# Patient Record
Sex: Female | Born: 1955 | Race: White | Hispanic: No | Smoking: Current every day smoker
Health system: Southern US, Community
[De-identification: ages and names within clinical notes are randomized; demographics above are authoritative.]

## PROBLEM LIST (undated history)

## (undated) DIAGNOSIS — Z7289 Other problems related to lifestyle: Secondary | ICD-10-CM

## (undated) DIAGNOSIS — T7840XA Allergy, unspecified, initial encounter: Secondary | ICD-10-CM

## (undated) DIAGNOSIS — M359 Systemic involvement of connective tissue, unspecified: Secondary | ICD-10-CM

## (undated) DIAGNOSIS — F419 Anxiety disorder, unspecified: Secondary | ICD-10-CM

## (undated) DIAGNOSIS — E785 Hyperlipidemia, unspecified: Secondary | ICD-10-CM

## (undated) DIAGNOSIS — E119 Type 2 diabetes mellitus without complications: Secondary | ICD-10-CM

## (undated) DIAGNOSIS — F32A Depression, unspecified: Secondary | ICD-10-CM

## (undated) DIAGNOSIS — C801 Malignant (primary) neoplasm, unspecified: Secondary | ICD-10-CM

## (undated) DIAGNOSIS — J449 Chronic obstructive pulmonary disease, unspecified: Secondary | ICD-10-CM

## (undated) DIAGNOSIS — Z789 Other specified health status: Secondary | ICD-10-CM

## (undated) DIAGNOSIS — F209 Schizophrenia, unspecified: Secondary | ICD-10-CM

## (undated) DIAGNOSIS — J45909 Unspecified asthma, uncomplicated: Secondary | ICD-10-CM

## (undated) DIAGNOSIS — G8929 Other chronic pain: Secondary | ICD-10-CM

## (undated) HISTORY — DX: Anxiety disorder, unspecified: F41.9

## (undated) HISTORY — DX: Unspecified asthma, uncomplicated: J45.909

## (undated) HISTORY — DX: Allergy, unspecified, initial encounter: T78.40XA

## (undated) HISTORY — DX: Schizophrenia, unspecified: F20.9

## (undated) HISTORY — PX: ABDOMINAL HYSTERECTOMY: SHX81

## (undated) HISTORY — DX: Chronic obstructive pulmonary disease, unspecified: J44.9

## (undated) HISTORY — PX: APPENDECTOMY: SHX54

## (undated) HISTORY — DX: Type 2 diabetes mellitus without complications: E11.9

## (undated) HISTORY — DX: Hyperlipidemia, unspecified: E78.5

## (undated) HISTORY — DX: Malignant (primary) neoplasm, unspecified: C80.1

## (undated) HISTORY — DX: Depression, unspecified: F32.A

---

## 2000-02-04 ENCOUNTER — Inpatient Hospital Stay (HOSPITAL_COMMUNITY): Admission: EM | Admit: 2000-02-04 | Discharge: 2000-02-14 | Payer: Self-pay | Admitting: *Deleted

## 2003-12-07 ENCOUNTER — Inpatient Hospital Stay (HOSPITAL_COMMUNITY): Admission: EM | Admit: 2003-12-07 | Discharge: 2003-12-12 | Payer: Self-pay | Admitting: Psychiatry

## 2004-06-21 ENCOUNTER — Emergency Department (HOSPITAL_COMMUNITY): Admission: EM | Admit: 2004-06-21 | Discharge: 2004-06-21 | Payer: Self-pay | Admitting: Emergency Medicine

## 2011-10-28 DIAGNOSIS — R0602 Shortness of breath: Secondary | ICD-10-CM

## 2014-08-31 ENCOUNTER — Other Ambulatory Visit: Payer: Self-pay | Admitting: Family Medicine

## 2014-08-31 DIAGNOSIS — R921 Mammographic calcification found on diagnostic imaging of breast: Secondary | ICD-10-CM

## 2014-09-09 ENCOUNTER — Ambulatory Visit
Admission: RE | Admit: 2014-09-09 | Discharge: 2014-09-09 | Disposition: A | Discharge status: Home / Self Care | Payer: Medicare Other | Source: Ambulatory Visit | Attending: Family Medicine | Admitting: Family Medicine

## 2014-09-09 ENCOUNTER — Other Ambulatory Visit (HOSPITAL_COMMUNITY): Payer: Self-pay | Admitting: Diagnostic Radiology

## 2014-09-09 DIAGNOSIS — R921 Mammographic calcification found on diagnostic imaging of breast: Secondary | ICD-10-CM

## 2014-10-06 ENCOUNTER — Ambulatory Visit (INDEPENDENT_AMBULATORY_CARE_PROVIDER_SITE_OTHER): Payer: Medicare Other | Admitting: Orthopedic Surgery

## 2014-10-06 ENCOUNTER — Ambulatory Visit (INDEPENDENT_AMBULATORY_CARE_PROVIDER_SITE_OTHER): Payer: Medicare Other

## 2014-10-06 VITALS — BP 121/76 | Ht 65.5 in | Wt 218.0 lb

## 2014-10-06 DIAGNOSIS — M1711 Unilateral primary osteoarthritis, right knee: Secondary | ICD-10-CM | POA: Diagnosis not present

## 2014-10-06 DIAGNOSIS — M25561 Pain in right knee: Secondary | ICD-10-CM

## 2014-10-06 MED ORDER — DICLOFENAC POTASSIUM 50 MG PO TABS: 50.0000 mg | ORAL_TABLET | Freq: Two times a day (BID) | ORAL | Status: DC

## 2014-10-06 NOTE — Patient Instructions (Signed)
CALL TO ARRANGE THERAPY CHECK WITH YOUR PHARMACY, ONE SCRIPT SENT IN

## 2014-10-08 ENCOUNTER — Encounter: Payer: Self-pay | Admitting: Orthopedic Surgery

## 2014-10-08 DIAGNOSIS — M1711 Unilateral primary osteoarthritis, right knee: Secondary | ICD-10-CM | POA: Insufficient documentation

## 2014-10-08 NOTE — Progress Notes (Signed)
Patient ID: Jasmine Werner, female   DOB: 1956-04-12, 58 y.o.   MRN: [id number] Chief Complaint  Patient presents with  . Knee Pain    Right knee pain, referred by Dr. Scotty Court.   Subjective:    Jasmine Werner is a 58 y.o. female who presents with knee pain involving both knees. Onset was gradual, starting about 3 years ago. Inciting event: none known. Current symptoms include: giving out, pain located medial and lateral hemijoints, stiffness and swelling. Pain is aggravated by going up and down stairs and lying down . Patient has had no prior knee problems. Evaluation to date: none. Treatment to date: tylenol, pain cream .  Past Medical History  Diagnosis Date  . COPD (chronic obstructive pulmonary disease)   . Allergy     Past Surgical History  Procedure Laterality Date  . Abdominal hysterectomy    . Appendectomy      Family History  Problem Relation Age of Onset  . Asthma    . COPD    . Alcoholism    . Heart attack    . Depression    . Mental illness      Social History History  Substance Use Topics  . Smoking status: Never Smoker   . Smokeless tobacco: Not on file  . Alcohol Use: No    No Known Allergies  Current Outpatient Prescriptions  Medication Sig Dispense Refill  . buPROPion (ZYBAN) 150 MG 12 hr tablet Take 150 mg by mouth 2 (two) times daily.    . clonazePAM (KLONOPIN) 1 MG tablet Take 1 mg by mouth 2 (two) times daily.    . Multiple Vitamin (M.V.I. ADULT IV) Inject into the vein.    . polyethylene glycol (MIRALAX / GLYCOLAX) packet Take 17 g by mouth daily.    Marland Kitchen tiZANidine (ZANAFLEX) 4 MG capsule Take 4 mg by mouth 3 (three) times daily.    . diclofenac (CATAFLAM) 50 MG tablet Take 1 tablet (50 mg total) by mouth 2 (two) times daily. 60 tablet 3   No current facility-administered medications for this visit.      Review of Systems A comprehensive review of systems was negative except for: shortness of breath depression anxiety cervical spine disease and  neck pain and degenerative disc disease neck area.   Objective:    BP 121/76 mmHg  Ht 5' 5.5" (1.664 m)  Wt 98.884 kg (218 lb)  BMI 35.71 kg/m2 The patient's appearance is normal. She is oriented 3. Her mood is pleasant. Her upper extremities show no malalignment issues contractures subluxation atrophy or tremor   Right knee: positive exam findings: crepitus, medial joint line tenderness, lateral joint line tenderness and ROM limited to approximately 125 degrees and negative exam findings: ACL stable, PCL stable, MCL stable, LCL stable, no patellar laxity and McMurray's negative  Left knee:  normal and no effusion, full active range of motion, no joint line tenderness, ligamentous structures intact.  We note mild tenderness in the lower back and right gluteal area  normal skin was noted over both lower extremities. The patient's pulses were intact with good capillary refill and no peripheral edema. Normal sensation was noted in the lower extremities with 2+ reflexes and no pathologic reflexes. Overall balance was normal.    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 Physical therapy to address strengthening issues follow-up as needed  Right knee joint injection  Procedure note right knee injection verbal consent was obtained to inject right knee joint  Timeout was completed to confirm the site of injection  The medications used were 40 mg of Depo-Medrol and 1% lidocaine 3 cc  Anesthesia was provided by ethyl chloride and the skin was prepped with alcohol.  After cleaning the skin with alcohol a 20-gauge needle was used to inject the right knee joint. There were no complications. A sterile bandage was applied.

## 2015-08-12 ENCOUNTER — Other Ambulatory Visit: Payer: Self-pay

## 2015-08-12 ENCOUNTER — Emergency Department (HOSPITAL_COMMUNITY): Payer: Medicare Other

## 2015-08-12 ENCOUNTER — Encounter (HOSPITAL_COMMUNITY): Payer: Self-pay | Admitting: Emergency Medicine

## 2015-08-12 ENCOUNTER — Emergency Department (HOSPITAL_COMMUNITY)
Admission: EM | Admit: 2015-08-12 | Discharge: 2015-08-12 | Disposition: A | Discharge status: Home / Self Care | Payer: Medicare Other | Attending: Emergency Medicine | Admitting: Emergency Medicine

## 2015-08-12 DIAGNOSIS — J069 Acute upper respiratory infection, unspecified: Secondary | ICD-10-CM | POA: Insufficient documentation

## 2015-08-12 DIAGNOSIS — R51 Headache: Secondary | ICD-10-CM | POA: Diagnosis not present

## 2015-08-12 DIAGNOSIS — J441 Chronic obstructive pulmonary disease with (acute) exacerbation: Secondary | ICD-10-CM | POA: Diagnosis not present

## 2015-08-12 DIAGNOSIS — M791 Myalgia: Secondary | ICD-10-CM | POA: Insufficient documentation

## 2015-08-12 DIAGNOSIS — Z79899 Other long term (current) drug therapy: Secondary | ICD-10-CM | POA: Diagnosis not present

## 2015-08-12 DIAGNOSIS — Z791 Long term (current) use of non-steroidal anti-inflammatories (NSAID): Secondary | ICD-10-CM | POA: Diagnosis not present

## 2015-08-12 DIAGNOSIS — R110 Nausea: Secondary | ICD-10-CM | POA: Insufficient documentation

## 2015-08-12 DIAGNOSIS — R0602 Shortness of breath: Secondary | ICD-10-CM | POA: Diagnosis present

## 2015-08-12 DIAGNOSIS — Z7982 Long term (current) use of aspirin: Secondary | ICD-10-CM | POA: Insufficient documentation

## 2015-08-12 LAB — CBC WITH DIFFERENTIAL/PLATELET
Basophils Absolute: 0 10*3/uL (ref 0.0–0.1)
Basophils Relative: 0 % (ref 0–1)
Eosinophils Absolute: 0.2 10*3/uL (ref 0.0–0.7)
Eosinophils Relative: 2 % (ref 0–5)
HCT: 41.9 % (ref 36.0–46.0)
Hemoglobin: 13.4 g/dL (ref 12.0–15.0)
Lymphocytes Relative: 26 % (ref 12–46)
Lymphs Abs: 2.5 10*3/uL (ref 0.7–4.0)
MCH: 29.3 pg (ref 26.0–34.0)
MCHC: 32 g/dL (ref 30.0–36.0)
MCV: 91.5 fL (ref 78.0–100.0)
Monocytes Absolute: 0.6 10*3/uL (ref 0.1–1.0)
Monocytes Relative: 7 % (ref 3–12)
Neutro Abs: 6.1 10*3/uL (ref 1.7–7.7)
Neutrophils Relative %: 65 % (ref 43–77)
Platelets: 240 10*3/uL (ref 150–400)
RBC: 4.58 MIL/uL (ref 3.87–5.11)
RDW: 13.4 % (ref 11.5–15.5)
WBC: 9.5 10*3/uL (ref 4.0–10.5)

## 2015-08-12 LAB — BASIC METABOLIC PANEL
Anion gap: 7 (ref 5–15)
BUN: 19 mg/dL (ref 6–20)
CO2: 27 mmol/L (ref 22–32)
Calcium: 8.7 mg/dL — ABNORMAL LOW (ref 8.9–10.3)
Chloride: 109 mmol/L (ref 101–111)
Creatinine, Ser: 0.74 mg/dL (ref 0.44–1.00)
GFR calc Af Amer: >60 mL/min (ref >60)
GFR calc non Af Amer: >60 mL/min (ref >60)
Glucose, Bld: 119 mg/dL — ABNORMAL HIGH (ref 65–99)
Potassium: 3.8 mmol/L (ref 3.5–5.1)
Sodium: 143 mmol/L (ref 135–145)

## 2015-08-12 MED ORDER — IPRATROPIUM-ALBUTEROL 0.5-2.5 (3) MG/3ML IN SOLN
3.0000 mL | Freq: Once | RESPIRATORY_TRACT | Status: AC
  Administered 2015-08-12: 3 mL via RESPIRATORY_TRACT
  Filled 2015-08-12: qty 3

## 2015-08-12 MED ORDER — METHYLPREDNISOLONE SODIUM SUCC 125 MG IJ SOLR
125.0000 mg | Freq: Once | INTRAMUSCULAR | Status: AC
  Administered 2015-08-12: 125 mg via INTRAVENOUS
  Filled 2015-08-12: qty 2

## 2015-08-12 MED ORDER — PREDNISONE 10 MG PO TABS: 40.0000 mg | ORAL_TABLET | Freq: Every day | ORAL | Status: DC

## 2015-08-12 MED ORDER — DM-GUAIFENESIN ER 30-600 MG PO TB12: 1.0000 | ORAL_TABLET | Freq: Two times a day (BID) | ORAL | Status: DC

## 2015-08-12 NOTE — Discharge Instructions (Signed)
Usual albuterol inhaler 2 puffs every 6 hours for the next 7 days. Take prednisone as directed for the next 5 days. Take the Mucinex DM for cough and phlegm. Make an appointment to follow-up with your regular doctor. Return for any new or worse symptoms. Return for any difficulty with breathing.

## 2015-08-12 NOTE — ED Notes (Signed)
Pt c/o sob today. No relief from home nebulizer x 3. Pt states she "blacked out" while sitting in chair x 1 hour ago. denies falling. Pt also c/o chest tightness.

## 2015-08-12 NOTE — ED Provider Notes (Addendum)
CSN: [id number]     Arrival date & time 08/12/15  1543 History   First MD Initiated Contact with Patient 08/12/15 1548     Chief Complaint  Patient presents with  . Shortness of Breath     (Consider location/radiation/quality/duration/timing/severity/associated sxs/prior Treatment) The history is provided by the patient.   59 year old female with known history of COPD and asthma. Patient with shortness of breath today of feeling like she can't get air out. Using her albuterol at home without any success. Yesterday started to feel like she was coming down with an upper respiratory infection had a tingly throat some congestion mild cough started. Today that got worse and associated with some bodyaches and a headache. Patient felt like she had a fever at home but no fevers here. Patient still feels as if she can't move air well. Associated with some nausea but no vomiting or diarrhea.  Past Medical History  Diagnosis Date  . COPD (chronic obstructive pulmonary disease)   . Allergy    Past Surgical History  Procedure Laterality Date  . Abdominal hysterectomy    . Appendectomy     Family History  Problem Relation Age of Onset  . Asthma    . COPD    . Alcoholism    . Heart attack    . Depression    . Mental illness     Social History  Substance Use Topics  . Smoking status: Never Smoker   . Smokeless tobacco: None  . Alcohol Use: No   OB History    No data available     Review of Systems  Constitutional: Positive for fatigue. Negative for fever.  HENT: Positive for congestion and sore throat.   Eyes: Negative for visual disturbance.  Respiratory: Positive for cough, shortness of breath and wheezing.   Cardiovascular: Negative for chest pain.  Gastrointestinal: Positive for nausea. Negative for vomiting, abdominal pain and diarrhea.  Genitourinary: Negative for dysuria.  Musculoskeletal: Positive for myalgias.  Skin: Negative for rash.  Neurological: Positive for  headaches.  Hematological: Does not bruise/bleed easily.  Psychiatric/Behavioral: Negative for confusion.      Allergies  Review of patient's allergies indicates no known allergies.  Home Medications   Prior to Admission medications   Medication Sig Start Date End Date Taking? Authorizing Provider  aspirin EC 81 MG tablet Take 81 mg by mouth daily.   Yes Historical Provider, MD  clonazePAM (KLONOPIN) 1 MG tablet Take 1 mg by mouth daily.    Yes Historical Provider, MD  ipratropium-albuterol (DUONEB) 0.5-2.5 (3) MG/3ML SOLN Inhale 3 mLs into the lungs every 6 (six) hours as needed (Shortness of Breath).  07/21/15  Yes Historical Provider, MD  Multiple Vitamin (MULTIVITAMIN WITH MINERALS) TABS tablet Take 1 tablet by mouth daily.   Yes Historical Provider, MD  VENTOLIN HFA 108 (90 BASE) MCG/ACT inhaler Inhale 1 puff into the lungs every 6 (six) hours as needed for shortness of breath.  07/22/15  Yes Historical Provider, MD  dextromethorphan-guaiFENesin (MUCINEX DM) 30-600 MG per 12 hr tablet Take 1 tablet by mouth 2 (two) times daily. 08/12/15   Fredia Sorrow, MD  diclofenac (CATAFLAM) 50 MG tablet Take 1 tablet (50 mg total) by mouth 2 (two) times daily. 10/06/14   Carole Civil, MD  predniSONE (DELTASONE) 10 MG tablet Take 4 tablets (40 mg total) by mouth daily. 08/12/15   Fredia Sorrow, MD   BP 103/74 mmHg  Pulse 107  Temp(Src) 98.3 F (36.8 C)  Resp  29  Ht 5\' 5"  (1.651 m)  Wt 220 lb (99.791 kg)  BMI 36.61 kg/m2  SpO2 93% Physical Exam  Constitutional: She is oriented to person, place, and time. She appears well-developed and well-nourished. No distress.  HENT:  Head: Normocephalic and atraumatic.  Mouth/Throat: Oropharynx is clear and moist.  Eyes: Conjunctivae and EOM are normal. Pupils are equal, round, and reactive to light.  Neck: Normal range of motion. Neck supple.  Cardiovascular: Normal rate, regular rhythm and normal heart sounds.   No murmur  heard. Pulmonary/Chest: Effort normal. No respiratory distress. She has wheezes.  Abdominal: Soft. Bowel sounds are normal. She exhibits no distension.  Musculoskeletal: Normal range of motion.  Neurological: She is alert and oriented to person, place, and time. No cranial nerve deficit. She exhibits normal muscle tone. Coordination normal.  Skin: Skin is warm. No rash noted.  Nursing note and vitals reviewed.   ED Course  Procedures (including critical care time) Labs Review Labs Reviewed  BASIC METABOLIC PANEL - Abnormal; Notable for the following:    Glucose, Bld 119 (*)    Calcium 8.7 (*)    All other components within normal limits  CBC WITH DIFFERENTIAL/PLATELET   Results for orders placed or performed during the hospital encounter of 11/91/47  Basic metabolic panel  Result Value Ref Range   Sodium 143 135 - 145 mmol/L   Potassium 3.8 3.5 - 5.1 mmol/L   Chloride 109 101 - 111 mmol/L   CO2 27 22 - 32 mmol/L   Glucose, Bld 119 (H) 65 - 99 mg/dL   BUN 19 6 - 20 mg/dL   Creatinine, Ser 0.74 0.44 - 1.00 mg/dL   Calcium 8.7 (L) 8.9 - 10.3 mg/dL   GFR calc non Af Amer >60 >60 mL/min   GFR calc Af Amer >60 >60 mL/min   Anion gap 7 5 - 15  CBC with Differential/Platelet  Result Value Ref Range   WBC 9.5 4.0 - 10.5 K/uL   RBC 4.58 3.87 - 5.11 MIL/uL   Hemoglobin 13.4 12.0 - 15.0 g/dL   HCT 41.9 36.0 - 46.0 %   MCV 91.5 78.0 - 100.0 fL   MCH 29.3 26.0 - 34.0 pg   MCHC 32.0 30.0 - 36.0 g/dL   RDW 13.4 11.5 - 15.5 %   Platelets 240 150 - 400 K/uL   Neutrophils Relative % 65 43 - 77 %   Neutro Abs 6.1 1.7 - 7.7 K/uL   Lymphocytes Relative 26 12 - 46 %   Lymphs Abs 2.5 0.7 - 4.0 K/uL   Monocytes Relative 7 3 - 12 %   Monocytes Absolute 0.6 0.1 - 1.0 K/uL   Eosinophils Relative 2 0 - 5 %   Eosinophils Absolute 0.2 0.0 - 0.7 K/uL   Basophils Relative 0 0 - 1 %   Basophils Absolute 0.0 0.0 - 0.1 K/uL     Imaging Review Dg Chest 2 View  08/12/2015   CLINICAL DATA:  Acute  shortness of breath.  EXAM: CHEST  2 VIEW  COMPARISON:  June 21, 2004.  FINDINGS: The heart size and mediastinal contours are within normal limits. Both lungs are clear. No pneumothorax or pleural effusion is noted. He visualized skeletal structures are unremarkable.  IMPRESSION: No active cardiopulmonary disease.   Electronically Signed   By: Marijo Conception, M.D.   On: 08/12/2015 16:37   I have personally reviewed and evaluated these images and lab results as part of my medical decision-making.  EKG Interpretation None      ED ECG REPORT   Date: 08/12/2015  Rate: 111  Rhythm: normal sinus rhythm  QRS Axis: normal  Intervals: normal  ST/T Wave abnormalities: normal  Conduction Disutrbances:none  Narrative Interpretation:   Old EKG Reviewed: none available Technically sinus tachycardia I have personally reviewed the EKG tracing and agree with the computerized printout as noted.     MDM   Final diagnoses:  URI (upper respiratory infection)  COPD exacerbation   Patient with a history of COPD. Patient also yesterday started with a symptoms consistent with an upper respiratory infection. Some congestion mild sore throat. And mild cough. Today breathing got worse. Patient feels like a lot of drainage from the sinus area down the back of throat. And associated with fatigue and some body aches.  Upon presentation patient was wheezing bilaterally. Patient treated with a computer overall Atrovent nebulizer and wheezing resolved. Patient then was given Solu-Medrol. Patient also had the complaint of a mild headache not severe that was treated with Benadryl and Phenergan with significant improvement.  Patient will continue prednisone for the next 5 days patient will be started on Mucinex DM. Patient will also continue her albuterol inhaler at home for the next 7 days.  Chest x-ray was negative for pneumonia.   Fredia Sorrow, MD 08/12/15 1711  Fredia Sorrow, MD 08/12/15  1910  Addendum: Correction of patient's mild headache was not treated with Benadryl and Phenergan. Improved on its own.  Fredia Sorrow, MD 08/12/15 1931

## 2015-10-02 ENCOUNTER — Other Ambulatory Visit: Payer: Self-pay | Admitting: Orthopedic Surgery

## 2015-11-01 ENCOUNTER — Other Ambulatory Visit (HOSPITAL_COMMUNITY): Payer: Self-pay | Admitting: Respiratory Therapy

## 2015-11-01 DIAGNOSIS — J441 Chronic obstructive pulmonary disease with (acute) exacerbation: Secondary | ICD-10-CM

## 2015-11-08 ENCOUNTER — Ambulatory Visit (HOSPITAL_COMMUNITY)
Admission: RE | Admit: 2015-11-08 | Discharge: 2015-11-08 | Disposition: A | Discharge status: Home / Self Care | Payer: Medicare Other | Source: Ambulatory Visit | Attending: Pulmonary Disease | Admitting: Pulmonary Disease

## 2015-11-08 DIAGNOSIS — J441 Chronic obstructive pulmonary disease with (acute) exacerbation: Secondary | ICD-10-CM | POA: Insufficient documentation

## 2015-11-08 MED ORDER — ALBUTEROL SULFATE (2.5 MG/3ML) 0.083% IN NEBU
2.5000 mg | INHALATION_SOLUTION | Freq: Once | RESPIRATORY_TRACT | Status: AC
  Administered 2015-11-08: 2.5 mg via RESPIRATORY_TRACT

## 2015-11-13 LAB — PULMONARY FUNCTION TEST
DL/VA % pred: 114 %
DL/VA: 5.62 ml/min/mmHg/L
DLCO UNC % PRED: 65 %
DLCO UNC: 16.7 ml/min/mmHg
FEF 25-75 PRE: 0.74 L/s
FEF 25-75 Post: 1.15 L/sec
FEF2575-%Change-Post: 54 %
FEF2575-%PRED-POST: 45 %
FEF2575-%Pred-Pre: 29 %
FEV1-%CHANGE-POST: 16 %
FEV1-%PRED-POST: 54 %
FEV1-%PRED-PRE: 46 %
FEV1-POST: 1.48 L
FEV1-Pre: 1.27 L
FEV1FVC-%Change-Post: -1 %
FEV1FVC-%Pred-Pre: 82 %
FEV6-%CHANGE-POST: 17 %
FEV6-%PRED-POST: 67 %
FEV6-%Pred-Pre: 57 %
FEV6-POST: 2.29 L
FEV6-PRE: 1.95 L
FEV6FVC-%CHANGE-POST: 0 %
FEV6FVC-%PRED-POST: 102 %
FEV6FVC-%PRED-PRE: 103 %
FVC-%Change-Post: 17 %
FVC-%PRED-POST: 65 %
FVC-%Pred-Pre: 55 %
FVC-Post: 2.29 L
FVC-Pre: 1.95 L
POST FEV1/FVC RATIO: 65 %
POST FEV6/FVC RATIO: 100 %
PRE FEV6/FVC RATIO: 100 %
Pre FEV1/FVC ratio: 65 %
RV % PRED: 164 %
RV: 3.26 L
TLC % PRED: 96 %
TLC: 5.04 L

## 2015-12-19 DIAGNOSIS — F419 Anxiety disorder, unspecified: Secondary | ICD-10-CM | POA: Diagnosis not present

## 2015-12-19 DIAGNOSIS — F329 Major depressive disorder, single episode, unspecified: Secondary | ICD-10-CM | POA: Diagnosis not present

## 2015-12-19 DIAGNOSIS — J449 Chronic obstructive pulmonary disease, unspecified: Secondary | ICD-10-CM | POA: Diagnosis not present

## 2015-12-19 DIAGNOSIS — K210 Gastro-esophageal reflux disease with esophagitis: Secondary | ICD-10-CM | POA: Diagnosis not present

## 2016-02-14 DIAGNOSIS — K625 Hemorrhage of anus and rectum: Secondary | ICD-10-CM | POA: Diagnosis not present

## 2016-02-14 DIAGNOSIS — R1031 Right lower quadrant pain: Secondary | ICD-10-CM | POA: Diagnosis not present

## 2016-03-11 DIAGNOSIS — R509 Fever, unspecified: Secondary | ICD-10-CM | POA: Diagnosis not present

## 2016-07-24 DIAGNOSIS — J40 Bronchitis, not specified as acute or chronic: Secondary | ICD-10-CM | POA: Diagnosis not present

## 2016-07-24 DIAGNOSIS — R0602 Shortness of breath: Secondary | ICD-10-CM | POA: Diagnosis not present

## 2016-07-24 DIAGNOSIS — R062 Wheezing: Secondary | ICD-10-CM | POA: Diagnosis not present

## 2016-07-24 DIAGNOSIS — R05 Cough: Secondary | ICD-10-CM | POA: Diagnosis not present

## 2016-07-24 DIAGNOSIS — J441 Chronic obstructive pulmonary disease with (acute) exacerbation: Secondary | ICD-10-CM | POA: Diagnosis not present

## 2016-07-30 DIAGNOSIS — J441 Chronic obstructive pulmonary disease with (acute) exacerbation: Secondary | ICD-10-CM | POA: Diagnosis not present

## 2016-07-30 DIAGNOSIS — J449 Chronic obstructive pulmonary disease, unspecified: Secondary | ICD-10-CM | POA: Diagnosis not present

## 2016-07-30 DIAGNOSIS — Z6835 Body mass index (BMI) 35.0-35.9, adult: Secondary | ICD-10-CM | POA: Diagnosis not present

## 2016-09-05 DIAGNOSIS — Z23 Encounter for immunization: Secondary | ICD-10-CM | POA: Diagnosis not present

## 2016-09-05 DIAGNOSIS — F413 Other mixed anxiety disorders: Secondary | ICD-10-CM | POA: Diagnosis not present

## 2016-09-05 DIAGNOSIS — F329 Major depressive disorder, single episode, unspecified: Secondary | ICD-10-CM | POA: Diagnosis not present

## 2017-01-26 DIAGNOSIS — Z79899 Other long term (current) drug therapy: Secondary | ICD-10-CM | POA: Diagnosis not present

## 2017-01-26 DIAGNOSIS — M79601 Pain in right arm: Secondary | ICD-10-CM | POA: Diagnosis not present

## 2017-01-26 DIAGNOSIS — Z808 Family history of malignant neoplasm of other organs or systems: Secondary | ICD-10-CM | POA: Diagnosis not present

## 2017-01-26 DIAGNOSIS — R0789 Other chest pain: Secondary | ICD-10-CM | POA: Diagnosis not present

## 2017-01-26 DIAGNOSIS — Z8249 Family history of ischemic heart disease and other diseases of the circulatory system: Secondary | ICD-10-CM | POA: Diagnosis not present

## 2017-01-26 DIAGNOSIS — M940 Chondrocostal junction syndrome [Tietze]: Secondary | ICD-10-CM | POA: Diagnosis not present

## 2017-01-26 DIAGNOSIS — Z780 Asymptomatic menopausal state: Secondary | ICD-10-CM | POA: Diagnosis not present

## 2017-01-26 DIAGNOSIS — J441 Chronic obstructive pulmonary disease with (acute) exacerbation: Secondary | ICD-10-CM | POA: Diagnosis not present

## 2017-01-26 DIAGNOSIS — J439 Emphysema, unspecified: Secondary | ICD-10-CM | POA: Diagnosis not present

## 2017-01-26 DIAGNOSIS — F419 Anxiety disorder, unspecified: Secondary | ICD-10-CM | POA: Diagnosis not present

## 2017-01-26 DIAGNOSIS — R000 Tachycardia, unspecified: Secondary | ICD-10-CM | POA: Diagnosis not present

## 2017-01-26 DIAGNOSIS — F329 Major depressive disorder, single episode, unspecified: Secondary | ICD-10-CM | POA: Diagnosis not present

## 2017-01-26 DIAGNOSIS — Z6836 Body mass index (BMI) 36.0-36.9, adult: Secondary | ICD-10-CM | POA: Diagnosis not present

## 2017-01-26 DIAGNOSIS — R072 Precordial pain: Secondary | ICD-10-CM | POA: Diagnosis not present

## 2017-01-26 DIAGNOSIS — F17210 Nicotine dependence, cigarettes, uncomplicated: Secondary | ICD-10-CM | POA: Diagnosis not present

## 2017-01-26 DIAGNOSIS — R079 Chest pain, unspecified: Secondary | ICD-10-CM | POA: Diagnosis not present

## 2017-01-26 DIAGNOSIS — I209 Angina pectoris, unspecified: Secondary | ICD-10-CM | POA: Diagnosis not present

## 2017-01-27 DIAGNOSIS — R079 Chest pain, unspecified: Secondary | ICD-10-CM | POA: Diagnosis not present

## 2017-02-03 DIAGNOSIS — R000 Tachycardia, unspecified: Secondary | ICD-10-CM | POA: Diagnosis not present

## 2017-02-03 DIAGNOSIS — J449 Chronic obstructive pulmonary disease, unspecified: Secondary | ICD-10-CM | POA: Diagnosis not present

## 2017-02-03 DIAGNOSIS — F329 Major depressive disorder, single episode, unspecified: Secondary | ICD-10-CM | POA: Diagnosis not present

## 2017-02-03 DIAGNOSIS — F17200 Nicotine dependence, unspecified, uncomplicated: Secondary | ICD-10-CM | POA: Diagnosis not present

## 2017-02-03 DIAGNOSIS — R0902 Hypoxemia: Secondary | ICD-10-CM | POA: Diagnosis not present

## 2017-03-04 DIAGNOSIS — F329 Major depressive disorder, single episode, unspecified: Secondary | ICD-10-CM | POA: Diagnosis not present

## 2017-03-04 DIAGNOSIS — J449 Chronic obstructive pulmonary disease, unspecified: Secondary | ICD-10-CM | POA: Diagnosis not present

## 2017-03-04 DIAGNOSIS — F413 Other mixed anxiety disorders: Secondary | ICD-10-CM | POA: Diagnosis not present

## 2017-04-16 DIAGNOSIS — Z79899 Other long term (current) drug therapy: Secondary | ICD-10-CM | POA: Diagnosis not present

## 2017-04-16 DIAGNOSIS — J441 Chronic obstructive pulmonary disease with (acute) exacerbation: Secondary | ICD-10-CM | POA: Diagnosis not present

## 2017-04-16 DIAGNOSIS — F329 Major depressive disorder, single episode, unspecified: Secondary | ICD-10-CM | POA: Diagnosis not present

## 2017-04-16 DIAGNOSIS — R05 Cough: Secondary | ICD-10-CM | POA: Diagnosis not present

## 2017-04-16 DIAGNOSIS — F17210 Nicotine dependence, cigarettes, uncomplicated: Secondary | ICD-10-CM | POA: Diagnosis not present

## 2017-04-16 DIAGNOSIS — Z87891 Personal history of nicotine dependence: Secondary | ICD-10-CM | POA: Diagnosis not present

## 2017-04-23 DIAGNOSIS — R5382 Chronic fatigue, unspecified: Secondary | ICD-10-CM | POA: Diagnosis not present

## 2017-04-23 DIAGNOSIS — J441 Chronic obstructive pulmonary disease with (acute) exacerbation: Secondary | ICD-10-CM | POA: Diagnosis not present

## 2017-07-01 DIAGNOSIS — L309 Dermatitis, unspecified: Secondary | ICD-10-CM | POA: Diagnosis not present

## 2017-07-01 DIAGNOSIS — L732 Hidradenitis suppurativa: Secondary | ICD-10-CM | POA: Diagnosis not present

## 2017-07-01 DIAGNOSIS — F329 Major depressive disorder, single episode, unspecified: Secondary | ICD-10-CM | POA: Diagnosis not present

## 2017-09-22 DIAGNOSIS — Z23 Encounter for immunization: Secondary | ICD-10-CM | POA: Diagnosis not present

## 2017-10-01 DIAGNOSIS — R319 Hematuria, unspecified: Secondary | ICD-10-CM | POA: Diagnosis not present

## 2017-10-01 DIAGNOSIS — R1031 Right lower quadrant pain: Secondary | ICD-10-CM | POA: Diagnosis not present

## 2017-10-01 DIAGNOSIS — N390 Urinary tract infection, site not specified: Secondary | ICD-10-CM | POA: Diagnosis not present

## 2017-10-01 DIAGNOSIS — K8080 Other cholelithiasis without obstruction: Secondary | ICD-10-CM | POA: Diagnosis not present

## 2017-10-01 DIAGNOSIS — F17200 Nicotine dependence, unspecified, uncomplicated: Secondary | ICD-10-CM | POA: Diagnosis not present

## 2017-10-01 DIAGNOSIS — J449 Chronic obstructive pulmonary disease, unspecified: Secondary | ICD-10-CM | POA: Diagnosis not present

## 2017-10-07 DIAGNOSIS — Z6835 Body mass index (BMI) 35.0-35.9, adult: Secondary | ICD-10-CM | POA: Diagnosis not present

## 2017-10-07 DIAGNOSIS — D3501 Benign neoplasm of right adrenal gland: Secondary | ICD-10-CM | POA: Diagnosis not present

## 2017-10-07 DIAGNOSIS — E669 Obesity, unspecified: Secondary | ICD-10-CM | POA: Diagnosis not present

## 2017-10-07 DIAGNOSIS — N390 Urinary tract infection, site not specified: Secondary | ICD-10-CM | POA: Diagnosis not present

## 2017-10-07 DIAGNOSIS — R319 Hematuria, unspecified: Secondary | ICD-10-CM | POA: Diagnosis not present

## 2017-10-09 DIAGNOSIS — D3501 Benign neoplasm of right adrenal gland: Secondary | ICD-10-CM | POA: Diagnosis not present

## 2017-11-28 DIAGNOSIS — R062 Wheezing: Secondary | ICD-10-CM | POA: Diagnosis not present

## 2017-11-28 DIAGNOSIS — R509 Fever, unspecified: Secondary | ICD-10-CM | POA: Diagnosis not present

## 2017-11-28 DIAGNOSIS — R05 Cough: Secondary | ICD-10-CM | POA: Diagnosis not present

## 2017-11-28 DIAGNOSIS — J984 Other disorders of lung: Secondary | ICD-10-CM | POA: Diagnosis not present

## 2017-11-28 DIAGNOSIS — J449 Chronic obstructive pulmonary disease, unspecified: Secondary | ICD-10-CM | POA: Diagnosis not present

## 2018-01-27 DIAGNOSIS — E782 Mixed hyperlipidemia: Secondary | ICD-10-CM | POA: Diagnosis not present

## 2018-01-27 DIAGNOSIS — J449 Chronic obstructive pulmonary disease, unspecified: Secondary | ICD-10-CM | POA: Diagnosis not present

## 2018-01-27 DIAGNOSIS — F331 Major depressive disorder, recurrent, moderate: Secondary | ICD-10-CM | POA: Diagnosis not present

## 2018-01-27 DIAGNOSIS — Z23 Encounter for immunization: Secondary | ICD-10-CM | POA: Diagnosis not present

## 2018-01-27 DIAGNOSIS — E6609 Other obesity due to excess calories: Secondary | ICD-10-CM | POA: Diagnosis not present

## 2018-02-02 DIAGNOSIS — Z1231 Encounter for screening mammogram for malignant neoplasm of breast: Secondary | ICD-10-CM | POA: Diagnosis not present

## 2018-02-20 DIAGNOSIS — L03115 Cellulitis of right lower limb: Secondary | ICD-10-CM | POA: Diagnosis not present

## 2018-03-09 DIAGNOSIS — J441 Chronic obstructive pulmonary disease with (acute) exacerbation: Secondary | ICD-10-CM | POA: Diagnosis not present

## 2018-03-09 DIAGNOSIS — Z6835 Body mass index (BMI) 35.0-35.9, adult: Secondary | ICD-10-CM | POA: Diagnosis not present

## 2018-03-09 DIAGNOSIS — J209 Acute bronchitis, unspecified: Secondary | ICD-10-CM | POA: Diagnosis not present

## 2018-06-09 DIAGNOSIS — J441 Chronic obstructive pulmonary disease with (acute) exacerbation: Secondary | ICD-10-CM | POA: Diagnosis not present

## 2018-06-09 DIAGNOSIS — E782 Mixed hyperlipidemia: Secondary | ICD-10-CM | POA: Diagnosis not present

## 2018-06-09 DIAGNOSIS — F331 Major depressive disorder, recurrent, moderate: Secondary | ICD-10-CM | POA: Diagnosis not present

## 2018-06-11 DIAGNOSIS — E782 Mixed hyperlipidemia: Secondary | ICD-10-CM | POA: Diagnosis not present

## 2018-06-11 DIAGNOSIS — F331 Major depressive disorder, recurrent, moderate: Secondary | ICD-10-CM | POA: Diagnosis not present

## 2018-06-11 DIAGNOSIS — Z23 Encounter for immunization: Secondary | ICD-10-CM | POA: Diagnosis not present

## 2018-06-11 DIAGNOSIS — J449 Chronic obstructive pulmonary disease, unspecified: Secondary | ICD-10-CM | POA: Diagnosis not present

## 2018-06-11 DIAGNOSIS — E1165 Type 2 diabetes mellitus with hyperglycemia: Secondary | ICD-10-CM | POA: Diagnosis not present

## 2018-06-11 DIAGNOSIS — Z0001 Encounter for general adult medical examination with abnormal findings: Secondary | ICD-10-CM | POA: Diagnosis not present

## 2018-06-11 DIAGNOSIS — F17210 Nicotine dependence, cigarettes, uncomplicated: Secondary | ICD-10-CM | POA: Diagnosis not present

## 2018-06-11 DIAGNOSIS — Z6837 Body mass index (BMI) 37.0-37.9, adult: Secondary | ICD-10-CM | POA: Diagnosis not present

## 2018-07-28 DIAGNOSIS — R1011 Right upper quadrant pain: Secondary | ICD-10-CM | POA: Diagnosis not present

## 2018-07-28 DIAGNOSIS — K760 Fatty (change of) liver, not elsewhere classified: Secondary | ICD-10-CM | POA: Diagnosis not present

## 2018-07-28 DIAGNOSIS — N281 Cyst of kidney, acquired: Secondary | ICD-10-CM | POA: Diagnosis not present

## 2018-07-29 NOTE — Progress Notes (Signed)
Psychiatric Initial Adult Assessment   Patient Identification: Ladonya Jerkins MRN:  [id number] Date of Evaluation:  07/31/2018 Referral Source: [facility], DR. Gar Ponto Chief Complaint:   Chief Complaint    Depression; Anxiety; Psychiatric Evaluation    "I am exhausted by mental anguish all the time" Visit Diagnosis:    ICD-10-CM   1. MDD (major depressive disorder), recurrent severe, without psychosis ([place]) F33.2   2. Alcohol use disorder, moderate, in sustained remission ([facility]) F10.21     History of Present Illness:   Kanchan Gal is a 62 y.o. year old female with a history of depression, COPD, type II diabetes, who is referred for depression.   Patient states that she wants to transfer the care from [facility].  She has been worsening depression for the past 6 months. She states that "I am exhausted by mental anguish all the time," although she is unable to elaborate any triggers. She reports good relationship with her children. She misses her son, who will be moving to [place] soon. She states that her children advised the patient to seek health for her mental health. She reports that she did not have "luck" of marriages and feels content to be by herself.  She feels fatigue, depressed. She has insomnia. She has crying spells. She reports SI. She may try to do it if she were to find a way, which does not look like she attempted. However, she also reports that she has children and she cannot do it for them. She contacted to make this appointment to seek help. She feels confident that she contacts emergency resources if any worsening in SI. She used to abuse alcohol; last use was 15 years ago. She used to drink six beers maximum everyday to "cope with life." She has a history of blackout. She used to use pot and cocaine years ago.  She goes to [place] meeting once a month or if any special event. Her spirituality and her health condition helps her to stay sober. Of note, she reports  side effect from variety of generic Wellbutrin. She did not fill her medication at the last visit with her provider.  TSH 4.01 on 06/2018  Per PMP,  Clonazepam last filled on 07/02/2018   Associated Signs/Symptoms: Depression Symptoms:  depressed mood, anhedonia, insomnia, fatigue, difficulty concentrating, (Hypo) Manic Symptoms:  denies decreased need for sleep, euphoria Anxiety Symptoms:  Excessive Worry, Panic Symptoms, Psychotic Symptoms:  dneies AH, VH, paranoia PTSD Symptoms: Negative  Past Psychiatric History:  Outpatient: [facility] Psychiatry admission: "a lot of times," last in 1993 in the context of relapse on alcohol, mother's loss, was in rehab facility in 1990's Previous suicide attempt: 4 times, cut her wrist, overdosed medication in her 20's  Past trials of medication: sertraline (memory loss),  Wellbutrin, clonazepam, lithium, prolixin, haldol History of violence:   Previous Psychotropic Medications: Yes   Substance Abuse History in the last 12 months:  No.  Consequences of Substance Abuse: NA  Past Medical History:  Past Medical History:  Diagnosis Date  . Allergy   . Anxiety   . Asthma   . COPD (chronic obstructive pulmonary disease) ([place])     Past Surgical History:  Procedure Laterality Date  . ABDOMINAL HYSTERECTOMY    . APPENDECTOMY      Family Psychiatric History:  As below  Family History:  Family History  Problem Relation Age of Onset  . Asthma Unknown   . COPD Unknown   . Alcoholism Unknown   .  Heart attack Unknown   . Depression Unknown   . Mental illness Unknown   . Alcohol abuse Mother   . Alcohol abuse Father   . Depression Father     Social History:   Social History   Socioeconomic History  . Marital status: Widowed    Spouse name: Not on file  . Number of children: 5  . Years of education: Not on file  . Highest education level: Some college, no degree  Occupational History  . Not on file  Social Needs  .  Financial resource strain: Not hard at all  . Food insecurity:    Worry: Sometimes true    Inability: Never true  . Transportation needs:    Medical: No    Non-medical: No  Tobacco Use  . Smoking status: Current Every Day Smoker    Packs/day: 0.25    Types: Cigarettes  . Smokeless tobacco: Never Used  Substance and Sexual Activity  . Alcohol use: No  . Drug use: No  . Sexual activity: Never  Lifestyle  . Physical activity:    Days per week: Not on file    Minutes per session: Not on file  . Stress: Very much  Relationships  . Social connections:    Talks on phone: Not on file    Gets together: Not on file    Attends religious service: Not on file    Active member of club or organization: Not on file    Attends meetings of clubs or organizations: More than 4 times per year    Relationship status: Widowed  Other Topics Concern  . Not on file  Social History Narrative  . Not on file    Additional Social History:  Divorced. (married three times, one of her ex-husbands was incarcerated. He reportedly "abandoned" the patient suddenly after 12 years or marriage, and "stole" her children, age 83 and 5 at that time. 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 She grew up in "alcoholic home," although she thought it was "normal." She reports that she could not bring her friends to home as her parents were unpredictable.   Education: 1 1/2 year community college   Allergies:  No Known Allergies  Metabolic Disorder Labs: No results found for: HGBA1C, MPG No results found for: PROLACTIN No results found for: CHOL, TRIG, HDL, CHOLHDL, VLDL, LDLCALC   Current Medications: Current Outpatient Medications  Medication Sig Dispense Refill  . aspirin EC 81 MG tablet Take 81 mg by mouth daily.    Marland Kitchen aspirin EC 81 MG  tablet Take 81 mg by mouth daily.    . budesonide-formoterol (SYMBICORT) 160-4.5 MCG/ACT inhaler Inhale 2 puffs into the lungs 2 (two) times daily.    . clonazePAM (KLONOPIN) 1 MG tablet Take 1 mg by mouth daily.     Marland Kitchen tiZANidine (ZANAFLEX) 4 MG capsule Take 4 mg by mouth once.    . VENTOLIN HFA 108 (90 BASE) MCG/ACT inhaler Inhale 1 puff into the lungs every 6 (six) hours as needed for shortness of breath.   2  . dextromethorphan-guaiFENesin (MUCINEX DM) 30-600 MG per 12 hr tablet Take 1 tablet by mouth 2 (two) times daily. 14 tablet 0  . diclofenac (CATAFLAM) 50 MG tablet Take 1 tablet (50 mg total) by mouth 2 (two) times daily. [place]  tablet 3  . ipratropium-albuterol (DUONEB) 0.5-2.5 (3) MG/3ML SOLN Inhale 3 mLs into the lungs every 6 (six) hours as needed (Shortness of Breath).   2  . Multiple Vitamin (MULTIVITAMIN WITH MINERALS) TABS tablet Take 1 tablet by mouth daily.    . predniSONE (DELTASONE) 10 MG tablet Take 4 tablets (40 mg total) by mouth daily. 20 tablet 0  . traZODone (DESYREL) 50 MG tablet 25-50 mg at night as needed for sleep 30 tablet 0  . venlafaxine XR (EFFEXOR-XR) 150 MG 24 hr capsule Take 1 capsule (150 mg total) by mouth daily with breakfast. 30 capsule 0  . venlafaxine XR (EFFEXOR-XR) 37.5 MG 24 hr capsule 37.5 mg for one week, then 75 mg for one week 21 capsule 0   No current facility-administered medications for this visit.     Neurologic: Headache: No Seizure: No Paresthesias:No  Musculoskeletal: Strength & Muscle Tone: within normal limits Gait & Station: normal Patient leans: N/A  Psychiatric Specialty Exam: Review of Systems  Psychiatric/Behavioral: Positive for depression and suicidal ideas. Negative for hallucinations, memory loss and substance abuse. The patient is nervous/anxious and has insomnia.   All other systems reviewed and are negative.   Blood pressure 115/80, pulse (!) 106, height 5\' 5"  (1.651 m), weight 226 lb (102.5 kg), SpO2 92 %.Body mass  index is 37.61 kg/m.  General Appearance: Fairly Groomed  Eye Contact:  Good  Speech:  Clear and Coherent  Volume:  Normal  Mood:  Depressed  Affect:  Appropriate, Congruent, Tearful and labile  Thought Process:  Coherent  Orientation:  Full (Time, Place, and Person)  Thought Content:  Logical  Suicidal Thoughts:  Yes.  without intent/plan  Homicidal Thoughts:  No  Memory:  Immediate;   Good  Judgement:  Fair  Insight:  Present  Psychomotor Activity:  Normal  Concentration:  Concentration: Good and Attention Span: Good  Recall:  Good  Fund of Knowledge:Good  Language: Good  Akathisia:  No  Handed:  Right  AIMS (if indicated):  N/A  Assets:  Communication Skills Desire for Improvement  ADL's:  Intact  Cognition: WNL  Sleep:  poor   Assessment Arnette Driggs is a 62 y.o. year old female with a history of depression, alcohol use disorder in sustained remission, COPD, type II diabetes, who is referred for depression.   # MDD, severe recurrent without psychotic features Exam is notable for labile affect, and the patient endorses worsening neurovegetative symptoms without significant psychosocial stressors. Will start Effexor to target depression.  Will start trazodone as needed for insomnia.  She is advised to limit use of clonazepam if able to avoid risk of dependence and oversedation.  Discussed behavioral activation.  She will greatly benefit from CBT; will make a referral.   Noted that although patient reports slightly worsening SI, she denies any intent.  Although she will [facility] from [facility], she is unable to go there due to transportation issues.  She smiles at the end of the interview and feels hopeful now that she has a plan.  Will continue to closely monitor. Emergency resources which includes 911, ED, suicide crisis line [phone]) are discussed.    Plan 1. Start Effexor 37.5 mg daily for one week, then 75 mg daily for one week, then 150 mg daily  2. Start Trazodone  25- 50 mg at night as needed for sleep 3. Continue clonazepam 1 mg daily as needed for anxiety 3. Referral to therapy  4. Return to clinic 9/19 2 PM for  30 mins  The patient demonstrates the following risk factors for suicide: Chronic risk factors for suicide include: psychiatric disorder of depression. Acute risk factors for suicide include: unemployment. Protective factors for this patient include: responsibility to others (children, family), coping skills and hope for the future. Considering these factors, the overall suicide risk at this point appears to be moderate, but not at imminent risk to self. Patient is appropriate for outpatient follow up. She denies gun access at home.    Treatment Plan Summary: Plan as above   [name], MD 8/30/201911:06 AM

## 2018-07-31 ENCOUNTER — Encounter (HOSPITAL_COMMUNITY): Payer: Self-pay | Admitting: Psychiatry

## 2018-07-31 ENCOUNTER — Ambulatory Visit (INDEPENDENT_AMBULATORY_CARE_PROVIDER_SITE_OTHER): Payer: Medicare Other | Admitting: Psychiatry

## 2018-07-31 VITALS — BP 115/80 | HR 106 | Ht 65.0 in | Wt 226.0 lb

## 2018-07-31 DIAGNOSIS — F332 Major depressive disorder, recurrent severe without psychotic features: Secondary | ICD-10-CM

## 2018-07-31 DIAGNOSIS — F1021 Alcohol dependence, in remission: Secondary | ICD-10-CM | POA: Insufficient documentation

## 2018-07-31 DIAGNOSIS — F331 Major depressive disorder, recurrent, moderate: Secondary | ICD-10-CM | POA: Insufficient documentation

## 2018-07-31 MED ORDER — VENLAFAXINE HCL ER 37.5 MG PO CP24: ORAL_CAPSULE | ORAL | 0 refills | Status: DC

## 2018-07-31 MED ORDER — VENLAFAXINE HCL ER 150 MG PO CP24: 150.0000 mg | ORAL_CAPSULE | Freq: Every day | ORAL | 0 refills | Status: DC

## 2018-07-31 MED ORDER — TRAZODONE HCL 50 MG PO TABS: ORAL_TABLET | ORAL | 0 refills | Status: DC

## 2018-07-31 NOTE — Patient Instructions (Addendum)
1. Start Effexor 37.5 mg daily for one week, then 75 mg daily for one week, then 150 mg daily  2. Start Trazodone 25- 50 mg at night as needed for sleep 3. Continue clonazepam 1 mg daily as needed for anxiety 3. Referral to therapy  4. Return to clinic 9/19 2 PM for 30 mins  CONTACT INFORMATION  What to do if you need to get in touch with someone regarding a psychiatric issue:  1. EMERGENCY: For psychiatric emergencies (if you are suicidal or if there are any other safety issues) call 911 and/or go to your nearest Emergency Room immediately.   2. IF YOU NEED SOMEONE TO TALK TO RIGHT NOW: Given my clinical responsibilities, I may not be able to speak with you over the phone for a prolonged period of time.  a. You may always call The National Suicide Prevention Lifeline at 1-800-273-TALK (662)704-6270).  b. Your county of residence will also have local crisis services. For [facility]: [place] at [phone] ([place])

## 2018-08-13 NOTE — Progress Notes (Signed)
[facility] MD/PA/NP OP Progress Note  08/21/2018 9:30 AM Jasmine Werner  MRN:  [id number]  Chief Complaint:  Chief Complaint    Depression; Follow-up     HPI:  Patient presents for follow-up appointment for depression.  She states that she feels much better since the last appointment.  She has started to clean the house and washing sheets, which she has not been able to do for a while.  Although there are times she cannot complete tasks, she let herself feel okay with it. She enjoyed being with her 2.5 months granddaughter. She went to playground.  Although she misses her granddaughter and her son, who will move to [place], she feels fine, stating that "I 'll find something to do."  She states that her daughter, who is a psychologist will start the connection group.  The patient might help there children, stating that she enjoys being with children.  She is also looking for to going to the beach with her friend.  She is planning to go to church with her son this weekend, although she has not been able to do so due to her anxiety.  She still feels "heavy on shoulder," although she makes herself do things.  She has fair sleep.  She has more energy and motivation.  She has fair concentration.  She has fleeting passive SI, which has been significantly improved since the last appointment.  She feels anxious and tense at times.  She denies panic attacks.  She states that she feels better when she is on steroid, although she understands that she cannot continue to take this medication.  She denies alcohol use.  She stayed on Effexor 37.5 mg without uptitration as she was concerned of potential side effect.   Per PMP Clonazepam filled on 07/30/2018    Visit Diagnosis:    ICD-10-CM   1. MDD (major depressive disorder), recurrent episode, moderate (HCC) F33.1   2. Alcohol use disorder, moderate, in sustained remission ([place]) F10.21     Past Psychiatric History: Please see initial evaluation for full details. I  have reviewed the history. No updates at this time.     Past Medical History:  Past Medical History:  Diagnosis Date  . Allergy   . Anxiety   . Asthma   . COPD (chronic obstructive pulmonary disease) ([place])     Past Surgical History:  Procedure Laterality Date  . ABDOMINAL HYSTERECTOMY    . APPENDECTOMY      Family Psychiatric History: Please see initial evaluation for full details. I have reviewed the history. No updates at this time.     Family History:  Family History  Problem Relation Age of Onset  . Asthma Unknown   . COPD Unknown   . Alcoholism Unknown   . Heart attack Unknown   . Depression Unknown   . Mental illness Unknown   . Alcohol abuse Mother   . Alcohol abuse Father   . Depression Father     Social History:  Social History   Socioeconomic History  . Marital status: Widowed    Spouse name: Not on file  . Number of children: 5  . Years of education: Not on file  . Highest education level: Some college, no degree  Occupational History  . Not on file  Social Needs  . Financial resource strain: Not hard at all  . Food insecurity:    Worry: Sometimes true    Inability: Never true  . Transportation needs:    Medical:  No    Non-medical: No  Tobacco Use  . Smoking status: Current Every Day Smoker    Packs/day: 0.25    Types: Cigarettes  . Smokeless tobacco: Never Used  Substance and Sexual Activity  . Alcohol use: No  . Drug use: No  . Sexual activity: Never  Lifestyle  . Physical activity:    Days per week: Not on file    Minutes per session: Not on file  . Stress: Very much  Relationships  . Social connections:    Talks on phone: Not on file    Gets together: Not on file    Attends religious service: Not on file    Active member of club or organization: Not on file    Attends meetings of clubs or organizations: More than 4 times per year    Relationship status: Widowed  Other Topics Concern  . Not on file  Social History Narrative   . Not on file    Allergies: No Known Allergies  Metabolic Disorder Labs: No results found for: HGBA1C, MPG No results found for: PROLACTIN No results found for: CHOL, TRIG, HDL, CHOLHDL, VLDL, LDLCALC No results found for: TSH  Therapeutic Level Labs: No results found for: LITHIUM No results found for: VALPROATE No components found for:  CBMZ  Current Medications: Current Outpatient Medications  Medication Sig Dispense Refill  . aspirin EC 81 MG tablet Take 81 mg by mouth daily.    Marland Kitchen aspirin EC 81 MG tablet Take 81 mg by mouth daily.    . budesonide-formoterol (SYMBICORT) 160-4.5 MCG/ACT inhaler Inhale 2 puffs into the lungs 2 (two) times daily.    Derrill Memo ON 08/30/2018] clonazePAM (KLONOPIN) 1 MG tablet 0.5- 1 mg daily as needed for anxiety 30 tablet 0  . dextromethorphan-guaiFENesin (MUCINEX DM) 30-600 MG per 12 hr tablet Take 1 tablet by mouth 2 (two) times daily. 14 tablet 0  . diclofenac (CATAFLAM) 50 MG tablet Take 1 tablet (50 mg total) by mouth 2 (two) times daily. 60 tablet 3  . ipratropium-albuterol (DUONEB) 0.5-2.5 (3) MG/3ML SOLN Inhale 3 mLs into the lungs every 6 (six) hours as needed (Shortness of Breath).   2  . Multiple Vitamin (MULTIVITAMIN WITH MINERALS) TABS tablet Take 1 tablet by mouth daily.    . predniSONE (DELTASONE) 10 MG tablet Take 4 tablets (40 mg total) by mouth daily. 20 tablet 0  . tiZANidine (ZANAFLEX) 4 MG capsule Take 4 mg by mouth once.    . traZODone (DESYREL) 50 MG tablet 25-50 mg at night as needed for sleep 30 tablet 0  . venlafaxine XR (EFFEXOR-XR) 150 MG 24 hr capsule Take 1 capsule (150 mg total) by mouth daily with breakfast. 30 capsule 0  . VENTOLIN HFA 108 (90 BASE) MCG/ACT inhaler Inhale 1 puff into the lungs every 6 (six) hours as needed for shortness of breath.   2  . venlafaxine XR (EFFEXOR-XR) 75 MG 24 hr capsule Take 1 capsule (75 mg total) by mouth daily with breakfast. 30 capsule 0   No current facility-administered medications  for this visit.      Musculoskeletal: Strength & Muscle Tone: within normal limits Gait & Station: normal Patient leans: N/A  Psychiatric Specialty Exam: Review of Systems  Psychiatric/Behavioral: Positive for depression and suicidal ideas. Negative for hallucinations, memory loss and substance abuse. The patient is nervous/anxious and has insomnia.   All other systems reviewed and are negative.   Blood pressure 128/84, pulse 98, height 5\' 5"  (1.651  m), weight 224 lb (101.6 kg), SpO2 93 %.Body mass index is 37.28 kg/m.  General Appearance: Fairly Groomed  Eye Contact:  Good  Speech:  Clear and Coherent  Volume:  Normal  Mood:  "better"  Affect:  Appropriate, Congruent and reactive- significantly improved  Thought Process:  Coherent  Orientation:  Full (Time, Place, and Person)  Thought Content: Logical   Suicidal Thoughts:  Yes.  without intent/plan  Homicidal Thoughts:  No  Memory:  Immediate;   Good  Judgement:  Good  Insight:  Fair  Psychomotor Activity:  Normal  Concentration:  Concentration: Good and Attention Span: Good  Recall:  Good  Fund of Knowledge: Good  Language: Good  Akathisia:  No  Handed:  Right  AIMS (if indicated): not done  Assets:  Communication Skills Desire for Improvement  ADL's:  Intact  Cognition: WNL  Sleep:  Fair   Screenings:   Assessment and Plan:  Jasmine Werner is a 62 y.o. year old female with a history of depression, alcohol use disorder in sustained remission, COPD, type II diabetes, who presents for follow up appointment for MDD (major depressive disorder), recurrent episode, moderate (HCC)  Alcohol use disorder, moderate, in sustained remission ([place])  # MDD, severe, recurrent without psychotic features Exam is notable for significant improvement in neurovegetative symptoms after starting Effexor.  Psychosocial stressors/trigger is still unknown.  Will uptitrate Effexor to target residual mood symptoms.  Will continue trazodone  as needed for insomnia.  Will continue clonazepam as needed for anxiety.  She agrees to try lower dose if possible.  Discussed risk of dependence and oversedation.  She will greatly benefit from CBT; will make a referral.  Plan I have reviewed and updated plans as below 1. Increase Effexor 75 mg daily  2. Continue Trazodone 25- 50 mg at night as needed for sleep  3. Continue clonazepam 0.5-1 mg daily as needed for anxiety 3. Referral to therapy  4. Return to clinic in one month for 15 mins  The patient demonstrates the following risk factors for suicide: Chronic risk factors for suicide include: psychiatric disorder of depression. Acute risk factors for suicide include: unemployment. Protective factors for this patient include: responsibility to others (children, family), coping skills and hope for the future. Considering these factors, the overall suicide risk at this point appears to be moderate, but not at imminent risk to self. Patient is appropriate for outpatient follow up. She denies gun access at home.   The duration of this appointment visit was 30 minutes of face-to-face time with the patient.  Greater than 50% of this time was spent in counseling, explanation of  diagnosis, planning of further management, and coordination of care.  [name], MD 08/21/2018, 9:30 AM

## 2018-08-21 ENCOUNTER — Ambulatory Visit (INDEPENDENT_AMBULATORY_CARE_PROVIDER_SITE_OTHER): Payer: Medicare Other | Admitting: Psychiatry

## 2018-08-21 VITALS — BP 128/84 | HR 98 | Ht 65.0 in | Wt 224.0 lb

## 2018-08-21 DIAGNOSIS — F331 Major depressive disorder, recurrent, moderate: Secondary | ICD-10-CM | POA: Diagnosis not present

## 2018-08-21 DIAGNOSIS — F419 Anxiety disorder, unspecified: Secondary | ICD-10-CM

## 2018-08-21 DIAGNOSIS — F1021 Alcohol dependence, in remission: Secondary | ICD-10-CM | POA: Diagnosis not present

## 2018-08-21 DIAGNOSIS — G4700 Insomnia, unspecified: Secondary | ICD-10-CM

## 2018-08-21 DIAGNOSIS — R45851 Suicidal ideations: Secondary | ICD-10-CM

## 2018-08-21 DIAGNOSIS — F17210 Nicotine dependence, cigarettes, uncomplicated: Secondary | ICD-10-CM | POA: Diagnosis not present

## 2018-08-21 MED ORDER — VENLAFAXINE HCL ER 75 MG PO CP24: 75.0000 mg | ORAL_CAPSULE | Freq: Every day | ORAL | 0 refills | Status: DC

## 2018-08-21 MED ORDER — CLONAZEPAM 1 MG PO TABS: ORAL_TABLET | ORAL | 0 refills | Status: DC

## 2018-08-21 NOTE — Patient Instructions (Signed)
1. Increase Effexor 75 mg daily  2. Cotinue Trazodone 25- 50 mg at night as needed for sleep  3. Continue clonazepam 0.5-1 mg daily as needed for anxiety 3. Referral to therapy  4. Return to clinic in one month for 15 mins

## 2018-09-15 DIAGNOSIS — E782 Mixed hyperlipidemia: Secondary | ICD-10-CM | POA: Diagnosis not present

## 2018-09-15 DIAGNOSIS — J441 Chronic obstructive pulmonary disease with (acute) exacerbation: Secondary | ICD-10-CM | POA: Diagnosis not present

## 2018-09-15 DIAGNOSIS — F17210 Nicotine dependence, cigarettes, uncomplicated: Secondary | ICD-10-CM | POA: Diagnosis not present

## 2018-09-15 DIAGNOSIS — E1165 Type 2 diabetes mellitus with hyperglycemia: Secondary | ICD-10-CM | POA: Diagnosis not present

## 2018-09-16 ENCOUNTER — Other Ambulatory Visit (HOSPITAL_COMMUNITY): Payer: Self-pay | Admitting: Psychiatry

## 2018-09-16 MED ORDER — VENLAFAXINE HCL ER 75 MG PO CP24: 75.0000 mg | ORAL_CAPSULE | Freq: Every day | ORAL | 0 refills | Status: DC

## 2018-09-18 DIAGNOSIS — Z1389 Encounter for screening for other disorder: Secondary | ICD-10-CM | POA: Diagnosis not present

## 2018-09-18 DIAGNOSIS — E782 Mixed hyperlipidemia: Secondary | ICD-10-CM | POA: Diagnosis not present

## 2018-09-18 DIAGNOSIS — Z1331 Encounter for screening for depression: Secondary | ICD-10-CM | POA: Diagnosis not present

## 2018-09-18 DIAGNOSIS — F17210 Nicotine dependence, cigarettes, uncomplicated: Secondary | ICD-10-CM | POA: Diagnosis not present

## 2018-09-18 DIAGNOSIS — G252 Other specified forms of tremor: Secondary | ICD-10-CM | POA: Diagnosis not present

## 2018-09-18 DIAGNOSIS — J449 Chronic obstructive pulmonary disease, unspecified: Secondary | ICD-10-CM | POA: Diagnosis not present

## 2018-09-18 DIAGNOSIS — Z23 Encounter for immunization: Secondary | ICD-10-CM | POA: Diagnosis not present

## 2018-09-18 DIAGNOSIS — E1165 Type 2 diabetes mellitus with hyperglycemia: Secondary | ICD-10-CM | POA: Diagnosis not present

## 2018-09-22 ENCOUNTER — Ambulatory Visit (HOSPITAL_COMMUNITY): Payer: Medicare Other | Admitting: Psychiatry

## 2018-09-22 NOTE — Progress Notes (Signed)
[facility] MD/PA/NP OP Progress Note  09/24/2018 9:03 AM Jasmine Werner  MRN:  [id number]  Chief Complaint:  Chief Complaint    Depression; Follow-up     HPI:  Patient presents for follow up appointment for depression.  She states that she has been doing much better since her last appointment.  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  She likes to support people, stating that she has compassion for others.  Discussed with the patient how she can have compassion for herself.  She is willing to work on.  She has started to eat healthier meal.  She hopes to go to join gym.  She bought a book to read.  There were a few times she felt depressed per week.  She also feels occasionally anxious, and could not taper off clonazepam.  She denies panic attacks.  She sleeps better.  She denies SI.   Per PMP,  clonazepam filled on 08/31/2018    Wt Readings from Last 3 Encounters:  09/24/18 221 lb (100.2 kg)  08/21/18 224 lb (101.6 kg)  07/31/18 226 lb (102.5 kg)     Visit Diagnosis:    ICD-10-CM   1. MDD (major depressive disorder), recurrent episode, moderate ([place]) F33.1     Past Psychiatric History: Please see initial evaluation for full details. I have reviewed the history. No updates at this time.     Past Medical History:  Past Medical History:  Diagnosis Date  . Allergy   . Anxiety   . Asthma   . COPD (chronic obstructive pulmonary disease) ([place])     Past Surgical History:  Procedure Laterality Date  . ABDOMINAL HYSTERECTOMY    . APPENDECTOMY      Family Psychiatric History: Please see initial evaluation for full details. I have reviewed the history. No updates at this time.     Family History:  Family History  Problem Relation Age of Onset  . Asthma Unknown   . COPD Unknown   . Alcoholism Unknown   . Heart attack Unknown   . Depression Unknown   . Mental illness Unknown   . Alcohol abuse Mother   . Alcohol abuse Father    . Depression Father     Social History:  Social History   Socioeconomic History  . Marital status: Widowed    Spouse name: Not on file  . Number of children: 5  . Years of education: Not on file  . Highest education level: Some college, no degree  Occupational History  . Not on file  Social Needs  . Financial resource strain: Not hard at all  . Food insecurity:    Worry: Sometimes true    Inability: Never true  . Transportation needs:    Medical: No    Non-medical: No  Tobacco Use  . Smoking status: Current Every Day Smoker    Packs/day: 0.25    Types: Cigarettes  . Smokeless tobacco: Never Used  Substance and Sexual Activity  . Alcohol use: No  . Drug use: No  . Sexual activity: Never  Lifestyle  . Physical activity:    Days per week: Not on file    Minutes per session: Not on file  . Stress: Very much  Relationships  . Social connections:    Talks on phone: Not on file    Gets together: Not on file    Attends religious service: Not on file    Active  member of club or organization: Not on file    Attends meetings of clubs or organizations: More than 4 times per year    Relationship status: Widowed  Other Topics Concern  . Not on file  Social History Narrative  . Not on file    Allergies: No Known Allergies  Metabolic Disorder Labs: No results found for: HGBA1C, MPG No results found for: PROLACTIN No results found for: CHOL, TRIG, HDL, CHOLHDL, VLDL, LDLCALC No results found for: TSH  Therapeutic Level Labs: No results found for: LITHIUM No results found for: VALPROATE No components found for:  CBMZ  Current Medications: Current Outpatient Medications  Medication Sig Dispense Refill  . aspirin EC 81 MG tablet Take 81 mg by mouth daily.    Marland Kitchen aspirin EC 81 MG tablet Take 81 mg by mouth daily.    . budesonide-formoterol (SYMBICORT) 160-4.5 MCG/ACT inhaler Inhale 2 puffs into the lungs 2 (two) times daily.    Derrill Memo ON 09/30/2018] clonazePAM  (KLONOPIN) 1 MG tablet 0.5- 1 mg daily as needed for anxiety 30 tablet 1  . dextromethorphan-guaiFENesin (MUCINEX DM) 30-600 MG per 12 hr tablet Take 1 tablet by mouth 2 (two) times daily. 14 tablet 0  . diclofenac (CATAFLAM) 50 MG tablet Take 1 tablet (50 mg total) by mouth 2 (two) times daily. 60 tablet 3  . ipratropium-albuterol (DUONEB) 0.5-2.5 (3) MG/3ML SOLN Inhale 3 mLs into the lungs every 6 (six) hours as needed (Shortness of Breath).   2  . Multiple Vitamin (MULTIVITAMIN WITH MINERALS) TABS tablet Take 1 tablet by mouth daily.    . predniSONE (DELTASONE) 10 MG tablet Take 4 tablets (40 mg total) by mouth daily. 20 tablet 0  . tiZANidine (ZANAFLEX) 4 MG capsule Take 4 mg by mouth once.    . traZODone (DESYREL) 50 MG tablet 25-50 mg at night as needed for sleep 30 tablet 0  . venlafaxine XR (EFFEXOR-XR) 75 MG 24 hr capsule Take 1 capsule (75 mg total) by mouth daily with breakfast. 30 capsule 0  . VENTOLIN HFA 108 (90 BASE) MCG/ACT inhaler Inhale 1 puff into the lungs every 6 (six) hours as needed for shortness of breath.   2  . venlafaxine XR (EFFEXOR-XR) 150 MG 24 hr capsule Take 1 capsule (150 mg total) by mouth daily with breakfast. 90 capsule 0   No current facility-administered medications for this visit.      Musculoskeletal: Strength & Muscle Tone: within normal limits Gait & Station: normal Patient leans: N/A  Psychiatric Specialty Exam: Review of Systems  Psychiatric/Behavioral: Positive for depression. Negative for hallucinations, memory loss, substance abuse and suicidal ideas. The patient is nervous/anxious. The patient does not have insomnia.   All other systems reviewed and are negative.   Blood pressure 117/84, pulse (!) 106, height 5\' 5"  (1.651 m), weight 221 lb (100.2 kg), SpO2 93 %.Body mass index is 36.78 kg/m.  General Appearance: Fairly Groomed  Eye Contact:  Good  Speech:  Clear and Coherent  Volume:  Normal  Mood:  "better"  Affect:  Appropriate,  Congruent and Full Range  Thought Process:  Coherent  Orientation:  Full (Time, Place, and Person)  Thought Content: Logical   Suicidal Thoughts:  No  Homicidal Thoughts:  No  Memory:  Immediate;   Good  Judgement:  Good  Insight:  Good  Psychomotor Activity:  Normal  Concentration:  Concentration: Good and Attention Span: Good  Recall:  Good  Fund of Knowledge: Good  Language: Good  Akathisia:  No  Handed:  Right  AIMS (if indicated): not done  Assets:  Communication Skills Desire for Improvement  ADL's:  Intact  Cognition: WNL  Sleep:  Good   Screenings:   Assessment and Plan:  Jasmine Werner is a 62 y.o. year old female with a history of depression,  alcohol use disorder in sustained remission,COPD,type II diabetes , who presents for follow up appointment for MDD (major depressive disorder), recurrent episode, moderate ([place])  # MDD, moderate, recurrent without psychotic features There has been significant improvement in depressive symptoms after uptitration of Effexor. Psychosocial stressors is still unknown. Will uptitrate Effexor to target residual mood symptoms. Will continue Trazodone prn for insomnia. Will continue clonazepam prn for anxiety. Discussed risk of dependence and oversedation. Discussed self compassion. She  will find a therapist in the local area.    Plan I have reviewed and updated plans as below 1. Increase Effexor 150 mg daily  2. Continue Trazodone 25- 50 mg at night as needed for sleep  3. Continue clonazepam 0.5-1 mg daily as needed for anxiety 4. Return to clinic in three months for 15 mins  The patient demonstrates the following risk factors for suicide: Chronic risk factors for suicide include:psychiatric disorder ofdepression. Acute risk factorsfor suicide include: unemployment. Protective factorsfor this patient include: responsibility to others (children, family), coping skills and hope for the future. Considering these factors, the  overall suicide risk at this point appears to bemoderate, but not at imminent risk to self. Patientisappropriate for outpatient follow up. She denies gun access at home.  [name], MD 09/24/2018, 9:03 AM

## 2018-09-24 ENCOUNTER — Ambulatory Visit (INDEPENDENT_AMBULATORY_CARE_PROVIDER_SITE_OTHER): Payer: Medicare Other | Admitting: Psychiatry

## 2018-09-24 ENCOUNTER — Encounter (HOSPITAL_COMMUNITY): Payer: Self-pay | Admitting: Psychiatry

## 2018-09-24 VITALS — BP 117/84 | HR 106 | Ht 65.0 in | Wt 221.0 lb

## 2018-09-24 DIAGNOSIS — F331 Major depressive disorder, recurrent, moderate: Secondary | ICD-10-CM

## 2018-09-24 DIAGNOSIS — F17210 Nicotine dependence, cigarettes, uncomplicated: Secondary | ICD-10-CM | POA: Diagnosis not present

## 2018-09-24 DIAGNOSIS — F419 Anxiety disorder, unspecified: Secondary | ICD-10-CM

## 2018-09-24 MED ORDER — CLONAZEPAM 1 MG PO TABS: ORAL_TABLET | ORAL | 1 refills | Status: DC

## 2018-09-24 MED ORDER — VENLAFAXINE HCL ER 150 MG PO CP24: 150.0000 mg | ORAL_CAPSULE | Freq: Every day | ORAL | 0 refills | Status: DC

## 2018-09-24 NOTE — Patient Instructions (Addendum)
1. Increase Effexor 150 mg daily  2. Continue Trazodone 25- 50 mg at night as needed for sleep  3. Continue clonazepam 0.5-1 mg daily as needed for anxiety 4. Return to clinic in three months for 15 mins

## 2018-10-27 ENCOUNTER — Ambulatory Visit (HOSPITAL_COMMUNITY): Payer: Self-pay | Admitting: Licensed Clinical Social Worker

## 2018-12-04 ENCOUNTER — Other Ambulatory Visit (HOSPITAL_COMMUNITY): Payer: Self-pay | Admitting: Psychiatry

## 2018-12-04 MED ORDER — CLONAZEPAM 1 MG PO TABS: ORAL_TABLET | ORAL | 0 refills | Status: DC

## 2018-12-10 DIAGNOSIS — H524 Presbyopia: Secondary | ICD-10-CM | POA: Diagnosis not present

## 2018-12-10 DIAGNOSIS — H5203 Hypermetropia, bilateral: Secondary | ICD-10-CM | POA: Diagnosis not present

## 2018-12-10 DIAGNOSIS — H52223 Regular astigmatism, bilateral: Secondary | ICD-10-CM | POA: Diagnosis not present

## 2018-12-10 DIAGNOSIS — H2513 Age-related nuclear cataract, bilateral: Secondary | ICD-10-CM | POA: Diagnosis not present

## 2018-12-24 NOTE — Progress Notes (Addendum)
BH MD/PA/NP OP Progress Note  12/25/2018 9:26 AM Jasmine Werner  MRN:  [id number]  Chief Complaint:  Chief Complaint    Follow-up; Depression     HPI:  Patient presents for follow-up appointment for depression.  She states that she lost her cousin, and will have a funeral tomorrow.  It has been difficult for the patient as she misses her cousin.  She also talks about another cousin, who abuses alcohol.  She felt disgusted by the smell, stating that she has been in sobriety for 17 years.  She tends to be anxious and she does not think she can taper off Klonopin.  She has been working on Diplomatic Services operational officer.  She also makes daily schedule, which was encouraged at the last visit.  Although she does mindfulness, she occasionally has a significant fear as she feels that there is big hall beside her (feeling depressed). She has occasional insomnia.   She has fair concentration and appetite.  She has passive SI, although she adamantly denies any intent or plans.  She feels anxious and tense.  She denies panic attacks.    Wt Readings from Last 3 Encounters:  12/25/18 216 lb (98 kg)  09/24/18 221 lb (100.2 kg)  08/21/18 224 lb (101.6 kg)    Clonazepam filled on 12/04/2018   Visit Diagnosis:    ICD-10-CM   1. MDD (major depressive disorder), recurrent episode, moderate ([place]) F33.1     Past Psychiatric History: Please see initial evaluation for full details. I have reviewed the history. No updates at this time.    Past Medical History:  Past Medical History:  Diagnosis Date  . Allergy   . Anxiety   . Asthma   . COPD (chronic obstructive pulmonary disease) ([place])     Past Surgical History:  Procedure Laterality Date  . ABDOMINAL HYSTERECTOMY    . APPENDECTOMY      Family Psychiatric History: Please see initial evaluation for full details. I have reviewed the history. No updates at this time.     Family History:  Family History  Problem Relation Age of Onset  . Asthma Unknown   .  COPD Unknown   . Alcoholism Unknown   . Heart attack Unknown   . Depression Unknown   . Mental illness Unknown   . Alcohol abuse Mother   . Alcohol abuse Father   . Depression Father     Social History:  Social History   Socioeconomic History  . Marital status: Widowed    Spouse name: Not on file  . Number of children: 5  . Years of education: Not on file  . Highest education level: Some college, no degree  Occupational History  . Not on file  Social Needs  . Financial resource strain: Not hard at all  . Food insecurity:    Worry: Sometimes true    Inability: Never true  . Transportation needs:    Medical: No    Non-medical: No  Tobacco Use  . Smoking status: Current Every Day Smoker    Packs/day: 0.25    Types: Cigarettes  . Smokeless tobacco: Never Used  Substance and Sexual Activity  . Alcohol use: No  . Drug use: No  . Sexual activity: Never  Lifestyle  . Physical activity:    Days per week: Not on file    Minutes per session: Not on file  . Stress: Very much  Relationships  . Social connections:    Talks on phone: Not on file  Gets together: Not on file    Attends religious service: Not on file    Active member of club or organization: Not on file    Attends meetings of clubs or organizations: More than 4 times per year    Relationship status: Widowed  Other Topics Concern  . Not on file  Social History Narrative  . Not on file    Allergies: No Known Allergies  Metabolic Disorder Labs: No results found for: HGBA1C, MPG No results found for: PROLACTIN No results found for: CHOL, TRIG, HDL, CHOLHDL, VLDL, LDLCALC No results found for: TSH  Therapeutic Level Labs: No results found for: LITHIUM No results found for: VALPROATE No components found for:  CBMZ  Current Medications: Current Outpatient Medications  Medication Sig Dispense Refill  . aspirin EC 81 MG tablet Take 81 mg by mouth daily.    Marland Kitchen aspirin EC 81 MG tablet Take 81 mg by mouth  daily.    . budesonide-formoterol (SYMBICORT) 160-4.5 MCG/ACT inhaler Inhale 2 puffs into the lungs 2 (two) times daily.    . clonazePAM (KLONOPIN) 1 MG tablet 0.5- 1.5 mg daily as needed for anxiety 45 tablet 2  . dextromethorphan-guaiFENesin (MUCINEX DM) 30-600 MG per 12 hr tablet Take 1 tablet by mouth 2 (two) times daily. 14 tablet 0  . diclofenac (CATAFLAM) 50 MG tablet Take 1 tablet (50 mg total) by mouth 2 (two) times daily. 60 tablet 3  . ipratropium-albuterol (DUONEB) 0.5-2.5 (3) MG/3ML SOLN Inhale 3 mLs into the lungs every 6 (six) hours as needed (Shortness of Breath).   2  . Multiple Vitamin (MULTIVITAMIN WITH MINERALS) TABS tablet Take 1 tablet by mouth daily.    . predniSONE (DELTASONE) 10 MG tablet Take 4 tablets (40 mg total) by mouth daily. 20 tablet 0  . tiZANidine (ZANAFLEX) 4 MG capsule Take 4 mg by mouth once.    . traZODone (DESYREL) 50 MG tablet 25-50 mg at night as needed for sleep 30 tablet 0  . venlafaxine XR (EFFEXOR-XR) 150 MG 24 hr capsule Take 187.5 mg daily (150 mg + 37.5 mg) 90 capsule 0  . VENTOLIN HFA 108 (90 BASE) MCG/ACT inhaler Inhale 1 puff into the lungs every 6 (six) hours as needed for shortness of breath.   2  . venlafaxine XR (EFFEXOR-XR) 37.5 MG 24 hr capsule Take 187.5 mg daily (150 mg + 37.5 mg) 90 capsule 0   No current facility-administered medications for this visit.      Musculoskeletal: Strength & Muscle Tone: within normal limits Gait & Station: normal Patient leans: N/A  Psychiatric Specialty Exam: ROS  Blood pressure 121/85, pulse 97, height 5\' 5"  (1.651 m), weight 216 lb (98 kg), SpO2 93 %.Body mass index is 35.94 kg/m.  General Appearance: Fairly Groomed  Eye Contact:  Good  Speech:  Clear and Coherent  Volume:  Normal  Mood:  Depressed  Affect:  Appropriate, Congruent and Restricted  Thought Process:  Coherent  Orientation:  Full (Time, Place, and Person)  Thought Content: Logical   Suicidal Thoughts:  Yes.  without  intent/plan  Homicidal Thoughts:  No  Memory:  Immediate;   Good  Judgement:  Good  Insight:  Good  Psychomotor Activity:  Normal  Concentration:  Concentration: Fair and Attention Span: Fair  Recall:  Good  Fund of Knowledge: Good  Language: Good  Akathisia:  No  Handed:  Right  AIMS (if indicated): not done  Assets:  Communication Skills Desire for Improvement  ADL's:  Intact  Cognition: WNL  Sleep:  Poor   Screenings:   Assessment and Plan:  Jasmine Werner is a 63 y.o. year old female with a history of depression, alcohol use disorder in sustained remission,COPD,type II diabetes, who presents for follow up appointment for MDD (major depressive disorder), recurrent episode, moderate ([place])  # MDD, moderate, recurrent without psychotic features Although there has been steady improvement in depressive symptoms, she reports slight worsening in the context of loss of her cousin.  Will uptitrate Effexor to target residual mood symptoms.  Will uptitrate clonazepam as needed for anxiety.  Discussed risk of dependence and oversedation.  Will continue trazodone as needed for insomnia.  Discussed self compassion.   Plan I have reviewed and updated plans as below 1. Increase Effexor 187.5 mg daily (150 mg + 37.5 mg) 2.ContinueTrazodone 25- 50 mg at night as needed for sleep  3. Increase clonazepamup to 1.5  mg daily as needed for anxiety 4. Return to clinic in three months for 15 mins - referral to therapy  Emergency resources which includes 911, ED, suicide crisis line [phone]) are discussed.   I have reviewed suicide assessment in detail. No change in the following assessment.   The patient demonstrates the following risk factors for suicide: Chronic risk factors for suicide include:psychiatric disorder ofdepression. Acute risk factorsfor suicide include: unemployment. Protective factorsfor this patient include: responsibility to others (children, family), coping  skills and hope for the future. Considering these factors, the overall suicide risk at this point appears to bemoderate, but not at imminent risk to self. Patientisappropriate for outpatient follow up. She denies gun access at home.  [name], MD 12/25/2018, 9:26 AM

## 2018-12-25 ENCOUNTER — Ambulatory Visit (INDEPENDENT_AMBULATORY_CARE_PROVIDER_SITE_OTHER): Payer: Medicare Other | Admitting: Psychiatry

## 2018-12-25 ENCOUNTER — Encounter (HOSPITAL_COMMUNITY): Payer: Self-pay | Admitting: Psychiatry

## 2018-12-25 VITALS — BP 121/85 | HR 97 | Ht 65.0 in | Wt 216.0 lb

## 2018-12-25 DIAGNOSIS — F331 Major depressive disorder, recurrent, moderate: Secondary | ICD-10-CM

## 2018-12-25 MED ORDER — CLONAZEPAM 1 MG PO TABS: ORAL_TABLET | ORAL | 2 refills | Status: DC

## 2018-12-25 MED ORDER — VENLAFAXINE HCL ER 37.5 MG PO CP24: ORAL_CAPSULE | ORAL | 0 refills | Status: DC

## 2018-12-25 MED ORDER — VENLAFAXINE HCL ER 150 MG PO CP24: ORAL_CAPSULE | ORAL | 0 refills | Status: DC

## 2018-12-25 NOTE — Patient Instructions (Signed)
1. Increase Effexor 187.5 mg daily (150 mg + 37.5 mg) 2.ContinueTrazodone 25- 50 mg at night as needed for sleep  3. Increase clonazepamup to 1.5  mg daily as needed for anxiety 4. Return to clinic in three months for 15 mins 5. CONTACT INFORMATION  What to do if you need to get in touch with someone regarding a psychiatric issue:  1. EMERGENCY: For psychiatric emergencies (if you are suicidal or if there are any other safety issues) call 911 and/or go to your nearest Emergency Room immediately.   2. IF YOU NEED SOMEONE TO TALK TO RIGHT NOW: Given my clinical responsibilities, I may not be able to speak with you over the phone for a prolonged period of time.  a. You may always call The National Suicide Prevention Lifeline at 1-800-273-TALK 256 755 1509).  b. Your county of residence will also have local crisis services. For [facility]: [place] at [phone] ([place])

## 2019-01-07 ENCOUNTER — Ambulatory Visit (INDEPENDENT_AMBULATORY_CARE_PROVIDER_SITE_OTHER): Payer: Medicare Other | Admitting: Psychiatry

## 2019-01-07 ENCOUNTER — Encounter (HOSPITAL_COMMUNITY): Payer: Self-pay | Admitting: Psychiatry

## 2019-01-07 DIAGNOSIS — F331 Major depressive disorder, recurrent, moderate: Secondary | ICD-10-CM

## 2019-01-07 DIAGNOSIS — F4310 Post-traumatic stress disorder, unspecified: Secondary | ICD-10-CM | POA: Diagnosis not present
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
## 2019-01-08 DIAGNOSIS — J440 Chronic obstructive pulmonary disease with acute lower respiratory infection: Secondary | ICD-10-CM | POA: Diagnosis not present

## 2019-01-08 DIAGNOSIS — R51 Headache: Secondary | ICD-10-CM | POA: Diagnosis not present

## 2019-01-08 DIAGNOSIS — R0602 Shortness of breath: Secondary | ICD-10-CM | POA: Diagnosis not present

## 2019-01-08 DIAGNOSIS — R05 Cough: Secondary | ICD-10-CM | POA: Diagnosis not present

## 2019-01-08 DIAGNOSIS — R509 Fever, unspecified: Secondary | ICD-10-CM | POA: Diagnosis not present

## 2019-01-12 DIAGNOSIS — F17210 Nicotine dependence, cigarettes, uncomplicated: Secondary | ICD-10-CM | POA: Diagnosis not present

## 2019-01-12 DIAGNOSIS — J449 Chronic obstructive pulmonary disease, unspecified: Secondary | ICD-10-CM | POA: Diagnosis not present

## 2019-01-12 DIAGNOSIS — E782 Mixed hyperlipidemia: Secondary | ICD-10-CM | POA: Diagnosis not present

## 2019-01-12 DIAGNOSIS — J441 Chronic obstructive pulmonary disease with (acute) exacerbation: Secondary | ICD-10-CM | POA: Diagnosis not present

## 2019-01-12 DIAGNOSIS — E1165 Type 2 diabetes mellitus with hyperglycemia: Secondary | ICD-10-CM | POA: Diagnosis not present

## 2019-01-14 ENCOUNTER — Ambulatory Visit (INDEPENDENT_AMBULATORY_CARE_PROVIDER_SITE_OTHER): Payer: Medicare Other | Admitting: Psychiatry

## 2019-01-14 ENCOUNTER — Encounter (HOSPITAL_COMMUNITY): Payer: Self-pay | Admitting: Psychiatry

## 2019-01-14 DIAGNOSIS — F4310 Post-traumatic stress disorder, unspecified: Secondary | ICD-10-CM

## 2019-01-14 DIAGNOSIS — F331 Major depressive disorder, recurrent, moderate: Secondary | ICD-10-CM

## 2019-01-14 NOTE — Progress Notes (Signed)
Client: Jasmine Werner  Date: 01/14/19  Time: 10:00-10:54a  Type of Therapy: Individual Therapy  Axis I/II Diagnosis:? Major Depressive Disorder, moderate recurrent; Posttraumatic Stress Disorder  Treatment goals addressed: To develop healthy coping skills to combat current anxiety and depressive symptoms. To complete TFCBT to heal from past traumas that still impact her today.  Interventions: TFCBT, Motivational Interviewing, Psychoeducation, Coping Skill Building  Summary: Client Annie, 63yo female who presents with Major Depressive Disorder and Posttraumatic Stress Disorder, due to significant traumatic grief, left unresolved, triggering present symptoms at reminders of the events. Counselor using therapeutic interventions to address recent major depressive episode and to prepare for a year of self-care and self-focus to better herself.  Therapist Response: [name] met with Counselor for individual therapy. Counselor joined with [name] as she shared about starting her therapy homework, relationship dynamics, her plan for wellness and her excitement to start the work on healing from past traumas. Counselor assessed current psychiatric symptoms and life stressors with Lilian. Kyrianna reports she remains sleeping off and on 3 hours at a time. She reporting improved mood and more hopefulness. Reading recommended book, "The Body Keeps the Score" has been helpful for her in better understanding her history with mental health. Counselor praised her for her motivation and work, as well as provided some psychoeducation on the material and the TFCBT process. Toshie excitedly shared that she altered an automatic response to her daughter to a healthier response, reporting positive outcomes for their relationship. Counselor prompted Jyra to share her practices of self-care and the importance of the practice during the therapy process. Josepha reported having several trips planned and her weekly self-care routine. We decided to  keep sessions light until after she returns from her trips. For homework Geraldene will make a list of things she's been neglecting and a plan to tackle them step by step, as well as continue reading her book.  Suicidal/Homicidal: No current safety concerns. No plan/intent to harm self or others. Reported at intake that she has thoughts of wanting to "speed up the process of dying", but denies currently acting on this and did not have a plan. She reports "self-medicating" in the past when depression is overwhelming. Will assess at each session.  Plan: To return in 1 week. Will apply skills learned in session at home, until next session.  ?  Lise Auer, LCSW

## 2019-01-15 DIAGNOSIS — F331 Major depressive disorder, recurrent, moderate: Secondary | ICD-10-CM | POA: Diagnosis not present

## 2019-01-15 DIAGNOSIS — E782 Mixed hyperlipidemia: Secondary | ICD-10-CM | POA: Diagnosis not present

## 2019-01-15 DIAGNOSIS — J449 Chronic obstructive pulmonary disease, unspecified: Secondary | ICD-10-CM | POA: Diagnosis not present

## 2019-01-15 DIAGNOSIS — M791 Myalgia, unspecified site: Secondary | ICD-10-CM | POA: Diagnosis not present

## 2019-01-15 DIAGNOSIS — F17210 Nicotine dependence, cigarettes, uncomplicated: Secondary | ICD-10-CM | POA: Diagnosis not present

## 2019-01-15 DIAGNOSIS — E1165 Type 2 diabetes mellitus with hyperglycemia: Secondary | ICD-10-CM | POA: Diagnosis not present

## 2019-01-20 ENCOUNTER — Ambulatory Visit (HOSPITAL_COMMUNITY): Payer: Medicare Other | Admitting: Psychiatry

## 2019-01-21 ENCOUNTER — Other Ambulatory Visit (HOSPITAL_COMMUNITY): Payer: Self-pay | Admitting: Psychiatry

## 2019-01-21 ENCOUNTER — Ambulatory Visit (HOSPITAL_COMMUNITY): Payer: Medicare Other | Admitting: Psychiatry

## 2019-01-21 MED ORDER — TRAZODONE HCL 50 MG PO TABS: ORAL_TABLET | ORAL | 0 refills | Status: DC

## 2019-02-04 ENCOUNTER — Encounter (HOSPITAL_COMMUNITY): Payer: Self-pay | Admitting: Psychiatry

## 2019-02-04 ENCOUNTER — Ambulatory Visit (INDEPENDENT_AMBULATORY_CARE_PROVIDER_SITE_OTHER): Payer: Medicare Other | Admitting: Psychiatry

## 2019-02-04 DIAGNOSIS — F4310 Post-traumatic stress disorder, unspecified: Secondary | ICD-10-CM | POA: Diagnosis not present

## 2019-02-04 DIAGNOSIS — F331 Major depressive disorder, recurrent, moderate: Secondary | ICD-10-CM | POA: Diagnosis not present

## 2019-02-05 NOTE — Progress Notes (Signed)
Client: Jasmine Werner  Date: 02/04/19  Time: 10:03-10:56a  Type of Therapy: Individual Therapy  Axis I/II Diagnosis:? Major Depressive Disorder, moderate recurrent; Posttraumatic Stress Disorder  Treatment goals addressed: To develop healthy coping skills to combat current anxiety and depressive symptoms. To complete TFCBT to heal from past traumas that still impact her today.  Interventions: TFCBT, Motivational Interviewing, Psychoeducation, Coping Skill Building  Summary: Client Jasmine Werner, 63yo female who presents with Major Depressive Disorder and Posttraumatic Stress Disorder, due to significant traumatic grief, left unresolved, triggering present symptoms at reminders of the events. Counselor using therapeutic interventions to address recent major depressive episode and to prepare for a year of self-care and self-focus to better herself.  Therapist Response: [name] met with Counselor for individual therapy. 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 Counselor assessed current psychiatric symptoms and life stressors with [name]. [name] reported that overall she is feeling her depression lifting and empowered to do more of the deeper work of healing. Counselor processed patterns of behavior in her relationships, with her as the caregiver, never the care receiver, much linked to guilt connected with her past addition and mental health issues. Counselor shared psychoeducation on the grief and loss process. Counselor provided psychoeducation on how chronic and complex traumatic grief impacts the body and mind. [name] shared healthy coping skills and realizations she has been having as she is more observant in her interactions with others. Counselor and [name] discussed self-care activity she could do  after the session to decompress from the heaviness of the session. [name] expressed gratitude for having a safe space to talk about and keep her difficult times and feelings. Counselor praised [name] for the work she is already accomplishing between sessions.  Suicidal/Homicidal: No current safety concerns. No plan/intent to harm self or others. Reported at intake that she has thoughts of wanting to "speed up the process of dying", but denies currently acting on this and did not have a plan. She reports "self-medicating" in the past when depression is overwhelming. Will assess at each session.  Plan: To return in 1 week. Will apply skills learned in session at home, until next session.  ?  Lise Auer, LCSW

## 2019-02-25 ENCOUNTER — Other Ambulatory Visit (HOSPITAL_COMMUNITY): Payer: Self-pay | Admitting: Psychiatry

## 2019-02-25 MED ORDER — TRAZODONE HCL 50 MG PO TABS: ORAL_TABLET | ORAL | 0 refills | Status: DC

## 2019-02-25 MED ORDER — CLONAZEPAM 1 MG PO TABS: ORAL_TABLET | ORAL | 0 refills | Status: DC

## 2019-03-01 ENCOUNTER — Ambulatory Visit (HOSPITAL_COMMUNITY): Payer: Medicare Other | Admitting: Psychiatry

## 2019-03-01 DIAGNOSIS — J441 Chronic obstructive pulmonary disease with (acute) exacerbation: Secondary | ICD-10-CM | POA: Diagnosis not present

## 2019-03-15 ENCOUNTER — Ambulatory Visit (HOSPITAL_COMMUNITY): Payer: Medicare Other | Admitting: Psychiatry

## 2019-03-22 NOTE — Progress Notes (Addendum)
Virtual Visit via Video Note  I connected with Jasmine Werner on 03/26/19 at  9:30 AM EDT by a video enabled telemedicine application and verified that I am speaking with the correct person using two identifiers.   I discussed the limitations of evaluation and management by telemedicine and the availability of in person appointments. The patient expressed understanding and agreed to proceed.   I discussed the assessment and treatment plan with the patient. The patient was provided an opportunity to ask questions and all were answered. The patient agreed with the plan and demonstrated an understanding of the instructions.   The patient was advised to call back or seek an in-person evaluation if the symptoms worsen or if the condition fails to improve as anticipated.  I provided 15 minutes of non-face-to-face time during this encounter.   [name], MD    [facility] MD/PA/NP OP Progress Note  03/26/2019 9:59 AM Jasmine Werner  MRN:  [id number]  Chief Complaint:  Chief Complaint    Follow-up; Depression     HPI:  This is a follow-up visit for depression.  She was in the middle of using nebulizer at the initial interview.  She states that she has been using it for a few times a week for COPD.  She is anxious about coronavirus.  She also becomes tearful when she talks about her cousin, who deceased recently.  She has been talks about her children, who helps the patient by bringing food.  She also has church members, who contacts with the patient.  She feels confused about the situation.  She has been trying to be happy.  She has been trying to do things inside the house, although she also states that it has been difficult for the patient to have daily routine.  She has insomnia.  She feels fatigue.  She feels depressed.  She has fair concentration.  She denies SI.  She feels anxious at times.  She denies panic attacks.  She denies irritability.  She states that she has vertigo in the morning. She  tries to take time when she wakes up.  Although she questions if it is due to venlafaxine, she is unsure if it happened after up titration of venlafaxine. She takes clonazepam for anxiety.  She is willing to switch to Ativan as it works well for her mother.   Clonazepam filled on 02/24/2019  Visit Diagnosis:    ICD-10-CM   1. MDD (major depressive disorder), recurrent episode, moderate ([place]) F33.1     Past Psychiatric History: Please see initial evaluation for full details. I have reviewed the history. No updates at this time.     Past Medical History:  Past Medical History:  Diagnosis Date  . Allergy   . Anxiety   . Asthma   . COPD (chronic obstructive pulmonary disease) ([place])     Past Surgical History:  Procedure Laterality Date  . ABDOMINAL HYSTERECTOMY    . APPENDECTOMY      Family Psychiatric History: Please see initial evaluation for full details. I have reviewed the history. No updates at this time.     Family History:  Family History  Problem Relation Age of Onset  . Asthma Unknown   . COPD Unknown   . Alcoholism Unknown   . Heart attack Unknown   . Depression Unknown   . Mental illness Unknown   . Alcohol abuse Mother   . Alcohol abuse Father   . Depression Father     Social History:  Social History   Socioeconomic History  . Marital status: Widowed    Spouse name: Not on file  . Number of children: 5  . Years of education: Not on file  . Highest education level: Some college, no degree  Occupational History  . Not on file  Social Needs  . Financial resource strain: Not hard at all  . Food insecurity:    Worry: Sometimes true    Inability: Never true  . Transportation needs:    Medical: No    Non-medical: No  Tobacco Use  . Smoking status: Current Every Day Smoker    Packs/day: 0.25    Types: Cigarettes  . Smokeless tobacco: Never Used  Substance and Sexual Activity  . Alcohol use: No  . Drug use: No  . Sexual activity: Never   Lifestyle  . Physical activity:    Days per week: Not on file    Minutes per session: Not on file  . Stress: Very much  Relationships  . Social connections:    Talks on phone: Not on file    Gets together: Not on file    Attends religious service: Not on file    Active member of club or organization: Not on file    Attends meetings of clubs or organizations: More than 4 times per year    Relationship status: Widowed  Other Topics Concern  . Not on file  Social History Narrative  . Not on file    Allergies: No Known Allergies  Metabolic Disorder Labs: No results found for: HGBA1C, MPG No results found for: PROLACTIN No results found for: CHOL, TRIG, HDL, CHOLHDL, VLDL, LDLCALC No results found for: TSH  Therapeutic Level Labs: No results found for: LITHIUM No results found for: VALPROATE No components found for:  CBMZ  Current Medications: Current Outpatient Medications  Medication Sig Dispense Refill  . aspirin EC 81 MG tablet Take 81 mg by mouth daily.    Marland Kitchen aspirin EC 81 MG tablet Take 81 mg by mouth daily.    . budesonide-formoterol (SYMBICORT) 160-4.5 MCG/ACT inhaler Inhale 2 puffs into the lungs 2 (two) times daily.    Marland Kitchen dextromethorphan-guaiFENesin (MUCINEX DM) 30-600 MG per 12 hr tablet Take 1 tablet by mouth 2 (two) times daily. 14 tablet 0  . diclofenac (CATAFLAM) 50 MG tablet Take 1 tablet (50 mg total) by mouth 2 (two) times daily. 60 tablet 3  . ipratropium-albuterol (DUONEB) 0.5-2.5 (3) MG/3ML SOLN Inhale 3 mLs into the lungs every 6 (six) hours as needed (Shortness of Breath).   2  . LORazepam (ATIVAN) 1 MG tablet 1-1.5 mg daily as needed for anxiety 45 tablet 1  . Multiple Vitamin (MULTIVITAMIN WITH MINERALS) TABS tablet Take 1 tablet by mouth daily.    . predniSONE (DELTASONE) 10 MG tablet Take 4 tablets (40 mg total) by mouth daily. 20 tablet 0  . tiZANidine (ZANAFLEX) 4 MG capsule Take 4 mg by mouth once.    . traZODone (DESYREL) 50 MG tablet 25-50 mg  at night as needed for sleep 90 tablet 0  . venlafaxine XR (EFFEXOR-XR) 150 MG 24 hr capsule Take 187.5 mg daily (150 mg + 37.5 mg) 90 capsule 0  . venlafaxine XR (EFFEXOR-XR) 37.5 MG 24 hr capsule Take 187.5 mg daily (150 mg + 37.5 mg) 90 capsule 0  . VENTOLIN HFA 108 (90 BASE) MCG/ACT inhaler Inhale 1 puff into the lungs every 6 (six) hours as needed for shortness of breath.   2  No current facility-administered medications for this visit.      Musculoskeletal: Strength & Muscle Tone: N/A Gait & Station: N/A Patient leans: N/A  Psychiatric Specialty Exam: Review of Systems  Psychiatric/Behavioral: Positive for depression. Negative for hallucinations, memory loss, substance abuse and suicidal ideas. The patient is nervous/anxious and has insomnia.   All other systems reviewed and are negative.   There were no vitals taken for this visit.There is no height or weight on file to calculate BMI.  General Appearance: Fairly Groomed  Eye Contact:  Good  Speech:  Clear and Coherent  Volume:  Normal  Mood:  Depressed  Affect:  Appropriate, Congruent and Labile  Thought Process:  Coherent  Orientation:  Full (Time, Place, and Person)  Thought Content: Logical   Suicidal Thoughts:  No  Homicidal Thoughts:  No  Memory:  Immediate;   Good  Judgement:  Good  Insight:  Fair  Psychomotor Activity:  Normal  Concentration:  Concentration: Good and Attention Span: Good  Recall:  Good  Fund of Knowledge: Good  Language: Good  Akathisia:  No  Handed:  Right  AIMS (if indicated): not done  Assets:  Communication Skills Desire for Improvement  ADL's:  Intact  Cognition: WNL  Sleep:  Fair   Screenings:   Assessment and Plan:  Jasmine Werner is a 63 y.o. year old female with a history of depression, alcohol use disorder in sustained remission,COPD,type II diabetes , who presents for follow up appointment for MDD (major depressive disorder), recurrent episode, moderate ([place])  # MDD,  moderate, recurrent without psychotic features Patient reports depressive symptoms and anxiety, although there has been slight improvement after up titration of venlafaxine.  Although she may benefit from further up titration of venlafaxine, she prefers to stay on the same medication regimen.  Will continue venlafaxine to target depression.  Will continue trazodone as needed for insomnia.  Discussed risk of drowsiness and fall.  Will switch from clonazepam to Lorazepam given patient reports history of vertigo, which may be attributable to orthostatic hypotension.  Discussed risk of fall and oversedation.  Discussed behavioral activation.   Plan I have reviewed and updated plans as below 1. Continue Effexor 187.5 mg daily (150 mg + 37.5 mg) 2.ContinueTrazodone 25- 50 mg at night as needed for sleep  3. Continue lorazepamup to 1.5  mg daily as needed for anxiety 4. Discontinue clonazepam 4. Next appointment: 6/19 at 10:40 for 20 mins, video visit - She agrees to contact therapist for follow up  I have reviewed suicide assessment in detail. No change in the following assessment.   The patient demonstrates the following risk factors for suicide: Chronic risk factors for suicide include:psychiatric disorder ofdepression. Acute risk factorsfor suicide include: unemployment. Protective factorsfor this patient include: responsibility to others (children, family), coping skills and hope for the future. Considering these factors, the overall suicide risk at this point appears to bemoderate, but not at imminent risk to self. Patientisappropriate for outpatient follow up. She denies gun access at home.  [name], MD 03/26/2019, 9:59 AM

## 2019-03-26 ENCOUNTER — Encounter (HOSPITAL_COMMUNITY): Payer: Self-pay | Admitting: Psychiatry

## 2019-03-26 ENCOUNTER — Other Ambulatory Visit: Payer: Self-pay

## 2019-03-26 ENCOUNTER — Ambulatory Visit (INDEPENDENT_AMBULATORY_CARE_PROVIDER_SITE_OTHER): Payer: Medicaid Other | Admitting: Psychiatry

## 2019-03-26 DIAGNOSIS — F331 Major depressive disorder, recurrent, moderate: Secondary | ICD-10-CM | POA: Diagnosis not present

## 2019-03-26 MED ORDER — VENLAFAXINE HCL ER 37.5 MG PO CP24: ORAL_CAPSULE | ORAL | 0 refills | Status: DC

## 2019-03-26 MED ORDER — TRAZODONE HCL 50 MG PO TABS: ORAL_TABLET | ORAL | 0 refills | Status: DC

## 2019-03-26 MED ORDER — VENLAFAXINE HCL ER 150 MG PO CP24: ORAL_CAPSULE | ORAL | 0 refills | Status: DC

## 2019-03-26 MED ORDER — LORAZEPAM 1 MG PO TABS: ORAL_TABLET | ORAL | 1 refills | Status: DC

## 2019-03-26 NOTE — Patient Instructions (Signed)
1. Continue Effexor 187.5 mg daily (150 mg + 37.5 mg) 2.ContinueTrazodone 25- 50 mg at night as needed for sleep  3. Continue lorazepamup to 1.5  mg daily as needed for anxiety 4. Discontinue clonazepam 4. Next appointment: 6/19 at 10:40 for 20 mins, video visit

## 2019-03-29 ENCOUNTER — Ambulatory Visit (HOSPITAL_COMMUNITY): Payer: Medicare Other | Admitting: Psychiatry

## 2019-04-12 DIAGNOSIS — N764 Abscess of vulva: Secondary | ICD-10-CM | POA: Diagnosis not present

## 2019-05-14 ENCOUNTER — Other Ambulatory Visit: Payer: Self-pay

## 2019-05-14 ENCOUNTER — Emergency Department (HOSPITAL_COMMUNITY)
Admission: EM | Admit: 2019-05-14 | Discharge: 2019-05-15 | Disposition: A | Discharge status: Home / Self Care | Payer: Medicare Other | Attending: Emergency Medicine | Admitting: Emergency Medicine

## 2019-05-14 ENCOUNTER — Emergency Department (HOSPITAL_COMMUNITY): Payer: Medicare Other

## 2019-05-14 ENCOUNTER — Encounter (HOSPITAL_COMMUNITY): Payer: Self-pay | Admitting: *Deleted

## 2019-05-14 DIAGNOSIS — J449 Chronic obstructive pulmonary disease, unspecified: Secondary | ICD-10-CM | POA: Diagnosis not present

## 2019-05-14 DIAGNOSIS — R069 Unspecified abnormalities of breathing: Secondary | ICD-10-CM | POA: Diagnosis not present

## 2019-05-14 DIAGNOSIS — Z7982 Long term (current) use of aspirin: Secondary | ICD-10-CM | POA: Insufficient documentation

## 2019-05-14 DIAGNOSIS — I213 ST elevation (STEMI) myocardial infarction of unspecified site: Secondary | ICD-10-CM | POA: Diagnosis not present

## 2019-05-14 DIAGNOSIS — R52 Pain, unspecified: Secondary | ICD-10-CM | POA: Diagnosis not present

## 2019-05-14 DIAGNOSIS — F17210 Nicotine dependence, cigarettes, uncomplicated: Secondary | ICD-10-CM | POA: Diagnosis not present

## 2019-05-14 DIAGNOSIS — R112 Nausea with vomiting, unspecified: Secondary | ICD-10-CM | POA: Insufficient documentation

## 2019-05-14 DIAGNOSIS — R1084 Generalized abdominal pain: Secondary | ICD-10-CM | POA: Diagnosis not present

## 2019-05-14 DIAGNOSIS — R0902 Hypoxemia: Secondary | ICD-10-CM | POA: Diagnosis not present

## 2019-05-14 DIAGNOSIS — Z79899 Other long term (current) drug therapy: Secondary | ICD-10-CM | POA: Diagnosis not present

## 2019-05-14 DIAGNOSIS — R1110 Vomiting, unspecified: Secondary | ICD-10-CM | POA: Diagnosis not present

## 2019-05-14 DIAGNOSIS — R1010 Upper abdominal pain, unspecified: Secondary | ICD-10-CM | POA: Diagnosis not present

## 2019-05-14 DIAGNOSIS — R109 Unspecified abdominal pain: Secondary | ICD-10-CM | POA: Diagnosis not present

## 2019-05-14 HISTORY — DX: Other chronic pain: G89.29

## 2019-05-14 HISTORY — DX: Other problems related to lifestyle: Z72.89

## 2019-05-14 HISTORY — DX: Other specified health status: Z78.9

## 2019-05-14 LAB — CBC WITH DIFFERENTIAL/PLATELET
Abs Immature Granulocytes: 0.07 10*3/uL (ref 0.00–0.07)
Basophils Absolute: 0.1 10*3/uL (ref 0.0–0.1)
Basophils Relative: 0 %
Eosinophils Absolute: 0 10*3/uL (ref 0.0–0.5)
Eosinophils Relative: 0 %
HCT: 53 % — ABNORMAL HIGH (ref 36.0–46.0)
Hemoglobin: 17 g/dL — ABNORMAL HIGH (ref 12.0–15.0)
Immature Granulocytes: 1 %
Lymphocytes Relative: 19 %
Lymphs Abs: 2.7 10*3/uL (ref 0.7–4.0)
MCH: 29.7 pg (ref 26.0–34.0)
MCHC: 32.1 g/dL (ref 30.0–36.0)
MCV: 92.7 fL (ref 80.0–100.0)
Monocytes Absolute: 1.1 10*3/uL — ABNORMAL HIGH (ref 0.1–1.0)
Monocytes Relative: 7 %
Neutro Abs: 10.3 10*3/uL — ABNORMAL HIGH (ref 1.7–7.7)
Neutrophils Relative %: 73 %
Platelets: 262 10*3/uL (ref 150–400)
RBC: 5.72 MIL/uL — ABNORMAL HIGH (ref 3.87–5.11)
RDW: 13.2 % (ref 11.5–15.5)
WBC: 14.2 10*3/uL — ABNORMAL HIGH (ref 4.0–10.5)
nRBC: 0 % (ref 0.0–0.2)

## 2019-05-14 LAB — URINALYSIS, ROUTINE W REFLEX MICROSCOPIC
Bilirubin Urine: NEGATIVE
Glucose, UA: NEGATIVE mg/dL
Hgb urine dipstick: NEGATIVE
Ketones, ur: 5 mg/dL — AB
Leukocytes,Ua: NEGATIVE
Nitrite: NEGATIVE
Protein, ur: NEGATIVE mg/dL
Specific Gravity, Urine: 1.028 (ref 1.005–1.030)
pH: 6 (ref 5.0–8.0)

## 2019-05-14 LAB — COMPREHENSIVE METABOLIC PANEL
ALT: 23 U/L (ref 0–44)
AST: 20 U/L (ref 15–41)
Albumin: 4.5 g/dL (ref 3.5–5.0)
Alkaline Phosphatase: 68 U/L (ref 38–126)
Anion gap: 13 (ref 5–15)
BUN: 8 mg/dL (ref 6–20)
CO2: 25 mmol/L (ref 22–32)
Calcium: 9.2 mg/dL (ref 8.9–10.3)
Chloride: 102 mmol/L (ref 98–111)
Creatinine, Ser: 0.61 mg/dL (ref 0.44–1.00)
GFR calc Af Amer: >60 mL/min (ref >60)
GFR calc non Af Amer: >60 mL/min (ref >60)
Glucose, Bld: 146 mg/dL — ABNORMAL HIGH (ref 70–99)
Potassium: 3.8 mmol/L (ref 3.5–5.1)
Sodium: 140 mmol/L (ref 135–145)
Total Bilirubin: 0.4 mg/dL (ref 0.3–1.2)
Total Protein: 7.9 g/dL (ref 6.5–8.1)

## 2019-05-14 LAB — ETHANOL: Alcohol, Ethyl (B): <10 mg/dL (ref <10)

## 2019-05-14 LAB — TROPONIN I: Troponin I: <0.03 ng/mL (ref <0.03)

## 2019-05-14 LAB — LIPASE, BLOOD: Lipase: 37 U/L (ref 11–51)

## 2019-05-14 MED ORDER — FAMOTIDINE 20 MG PO TABS: 20.0000 mg | ORAL_TABLET | Freq: Every day | ORAL | 0 refills | Status: DC

## 2019-05-14 MED ORDER — FAMOTIDINE IN NACL 20-0.9 MG/50ML-% IV SOLN
20.0000 mg | Freq: Once | INTRAVENOUS | Status: AC
  Administered 2019-05-14: 20 mg via INTRAVENOUS
  Filled 2019-05-14: qty 50

## 2019-05-14 MED ORDER — IPRATROPIUM-ALBUTEROL 0.5-2.5 (3) MG/3ML IN SOLN
3.0000 mL | Freq: Once | RESPIRATORY_TRACT | Status: AC
  Administered 2019-05-14: 3 mL via RESPIRATORY_TRACT
  Filled 2019-05-14: qty 3

## 2019-05-14 MED ORDER — FENTANYL CITRATE (PF) 100 MCG/2ML IJ SOLN
50.0000 ug | INTRAMUSCULAR | Status: AC | PRN
  Administered 2019-05-14 (×2): 50 ug via INTRAVENOUS
  Filled 2019-05-14 (×2): qty 2

## 2019-05-14 MED ORDER — IOHEXOL 300 MG/ML  SOLN
100.0000 mL | Freq: Once | INTRAMUSCULAR | Status: AC | PRN
  Administered 2019-05-14: 100 mL via INTRAVENOUS

## 2019-05-14 MED ORDER — ONDANSETRON HCL 4 MG/2ML IJ SOLN
4.0000 mg | INTRAMUSCULAR | Status: DC | PRN
  Administered 2019-05-14: 4 mg via INTRAVENOUS
  Filled 2019-05-14: qty 2

## 2019-05-14 MED ORDER — ONDANSETRON 4 MG PO TBDP: 4.0000 mg | ORAL_TABLET | Freq: Three times a day (TID) | ORAL | 0 refills | Status: DC | PRN

## 2019-05-14 MED ORDER — SODIUM CHLORIDE 0.9 % IV BOLUS
1000.0000 mL | Freq: Once | INTRAVENOUS | Status: AC
  Administered 2019-05-14: 1000 mL via INTRAVENOUS

## 2019-05-14 NOTE — ED Provider Notes (Signed)
Care assumed from Dr. Thurnell Garbe, patient with upper abdominal pain pending urinalysis.  Urinalysis is normal.  Patient is advised of this.  She is requesting pain medication to get her through the weekend.  I offered to give her a prescription for tramadol, but she states that that makes her too sleepy and requested hydrocodone.  Advised that that would not be given.  She is to follow-up with her PCP.  Results for orders placed or performed during the hospital encounter of 05/14/19  Comprehensive metabolic panel  Result Value Ref Range   Sodium 140 135 - 145 mmol/L   Potassium 3.8 3.5 - 5.1 mmol/L   Chloride 102 98 - 111 mmol/L   CO2 25 22 - 32 mmol/L   Glucose, Bld 146 (H) 70 - 99 mg/dL   BUN 8 6 - 20 mg/dL   Creatinine, Ser 0.61 0.44 - 1.00 mg/dL   Calcium 9.2 8.9 - 10.3 mg/dL   Total Protein 7.9 6.5 - 8.1 g/dL   Albumin 4.5 3.5 - 5.0 g/dL   AST 20 15 - 41 U/L   ALT 23 0 - 44 U/L   Alkaline Phosphatase 68 38 - 126 U/L   Total Bilirubin 0.4 0.3 - 1.2 mg/dL   GFR calc non Af Amer >60 >60 mL/min   GFR calc Af Amer >60 >60 mL/min   Anion gap 13 5 - 15  Lipase, blood  Result Value Ref Range   Lipase 37 11 - 51 U/L  Troponin I - Once  Result Value Ref Range   Troponin I <0.03 <0.03 ng/mL  CBC with Differential  Result Value Ref Range   WBC 14.2 (H) 4.0 - 10.5 K/uL   RBC 5.72 (H) 3.87 - 5.11 MIL/uL   Hemoglobin 17.0 (H) 12.0 - 15.0 g/dL   HCT 53.0 (H) 36.0 - 46.0 %   MCV 92.7 80.0 - 100.0 fL   MCH 29.7 26.0 - 34.0 pg   MCHC 32.1 30.0 - 36.0 g/dL   RDW 13.2 11.5 - 15.5 %   Platelets 262 150 - 400 K/uL   nRBC 0.0 0.0 - 0.2 %   Neutrophils Relative % 73 %   Neutro Abs 10.3 (H) 1.7 - 7.7 K/uL   Lymphocytes Relative 19 %   Lymphs Abs 2.7 0.7 - 4.0 K/uL   Monocytes Relative 7 %   Monocytes Absolute 1.1 (H) 0.1 - 1.0 K/uL   Eosinophils Relative 0 %   Eosinophils Absolute 0.0 0.0 - 0.5 K/uL   Basophils Relative 0 %   Basophils Absolute 0.1 0.0 - 0.1 K/uL   Immature Granulocytes 1 %    Abs Immature Granulocytes 0.07 0.00 - 0.07 K/uL  Urinalysis, Routine w reflex microscopic  Result Value Ref Range   Color, Urine YELLOW YELLOW   APPearance CLEAR CLEAR   Specific Gravity, Urine 1.028 1.005 - 1.030   pH 6.0 5.0 - 8.0   Glucose, UA NEGATIVE NEGATIVE mg/dL   Hgb urine dipstick NEGATIVE NEGATIVE   Bilirubin Urine NEGATIVE NEGATIVE   Ketones, ur 5 (A) NEGATIVE mg/dL   Protein, ur NEGATIVE NEGATIVE mg/dL   Nitrite NEGATIVE NEGATIVE   Leukocytes,Ua NEGATIVE NEGATIVE  Ethanol  Result Value Ref Range   Alcohol, Ethyl (B) <10 <10 mg/dL   Dg Chest 2 View  Result Date: 05/14/2019 CLINICAL DATA:  Nausea, vomiting EXAM: CHEST - 2 VIEW COMPARISON:  11/28/2017 FINDINGS: Heart and mediastinal contours are within normal limits. No focal opacities or effusions. No acute bony abnormality. IMPRESSION:  No active cardiopulmonary disease. Electronically Signed   By: Rolm Baptise M.D.   On: 05/14/2019 22:33   Ct Abdomen Pelvis W Contrast  Result Date: 05/14/2019 CLINICAL DATA:  Upper abdominal pain, nausea, vomiting EXAM: CT ABDOMEN AND PELVIS WITH CONTRAST TECHNIQUE: Multidetector CT imaging of the abdomen and pelvis was performed using the standard protocol following bolus administration of intravenous contrast. CONTRAST:  172mL OMNIPAQUE IOHEXOL 300 MG/ML  SOLN COMPARISON:  None. FINDINGS: Lower chest: Lung bases are clear. No effusions. Heart is normal size. Hepatobiliary: Diffuse low-density throughout the liver compatible with fatty infiltration. No focal abnormality. Gallbladder unremarkable. Pancreas: No focal abnormality or ductal dilatation. Spleen: No focal abnormality.  Normal size. Adrenals/Urinary Tract: Bilateral renal cysts appear benign. No hydronephrosis. Adrenal glands and urinary bladder unremarkable. Stomach/Bowel: Stomach, large and small bowel grossly unremarkable. Two large duodenal diverticula measuring 4 cm and 5 cm. Vascular/Lymphatic: Aortic atherosclerosis. No enlarged  abdominal or pelvic lymph nodes. Reproductive: Prior hysterectomy.  No adnexal masses. Other: No free fluid or free air. Musculoskeletal: No acute bony abnormality. IMPRESSION: Mild fatty infiltration of the liver. Aortic atherosclerosis. No acute findings in the abdomen or pelvis. Electronically Signed   By: Rolm Baptise M.D.   On: 76/80/8811 03:15      Delora Fuel, MD 94/58/59 [id number]

## 2019-05-14 NOTE — ED Notes (Signed)
Pox 88-89 %, Dr Thurnell Garbe notified , O2 at 2 L [place]

## 2019-05-14 NOTE — ED Triage Notes (Signed)
Pt reports upper abd pain nausea and vomiting today, reports history of "gallbladder pain" for a long time. The pain worsened today

## 2019-05-14 NOTE — ED Provider Notes (Signed)
[facility] EMERGENCY DEPARTMENT Provider Note   CSN: [id number] Arrival date & time: 05/14/19  2003     History   Chief Complaint Chief Complaint  Patient presents with  . Abdominal Pain    HPI Jasmine Werner is a 63 y.o. female.      Abdominal Pain   Pt was seen at 2015.  Per pt, c/o gradual onset and persistence of constant acute flair of her chronic upper abd "pain" since 3pm today.  Has been associated with multiple intermittent episodes of N/V/D.  Describes the abd pain as "my gallbladder pain." Pt states the pain began several hours after she ate a hamburger. Pt states she has longstanding issue "with my gallbladder" but has not f/u with a General Surgeon regarding removal.  Denies fevers, no back pain, no rash, no CP/SOB, no black or blood in stools or emesis, no injury.      Past Medical History:  Diagnosis Date  . Alcohol use   . Allergy   . Anxiety   . Asthma   . Chronic abdominal pain   . COPD (chronic obstructive pulmonary disease) [facility])     Patient Active Problem List   Diagnosis Date Noted  . MDD (major depressive disorder), recurrent episode, moderate ([place]) 07/31/2018  . Alcohol use disorder, moderate, in sustained remission ([place]) 07/31/2018  . Primary osteoarthritis of right knee 10/08/2014    Past Surgical History:  Procedure Laterality Date  . ABDOMINAL HYSTERECTOMY    . APPENDECTOMY       OB History   No obstetric history on file.      Home Medications    Prior to Admission medications   Medication Sig Start Date End Date Taking? Authorizing Provider  aspirin EC 81 MG tablet Take 81 mg by mouth daily.    [provider]  aspirin EC 81 MG tablet Take 81 mg by mouth daily.    [provider]  budesonide-formoterol (SYMBICORT) 160-4.5 MCG/ACT inhaler Inhale 2 puffs into the lungs 2 (two) times daily.    [provider]  dextromethorphan-guaiFENesin (MUCINEX DM) 30-600 MG per 12 hr tablet Take 1 tablet by mouth 2  (two) times daily. 08/12/15   Fredia Sorrow, MD  diclofenac (CATAFLAM) 50 MG tablet Take 1 tablet (50 mg total) by mouth 2 (two) times daily. 10/06/14   Carole Civil, MD  ipratropium-albuterol (DUONEB) 0.5-2.5 (3) MG/3ML SOLN Inhale 3 mLs into the lungs every 6 (six) hours as needed (Shortness of Breath).  07/21/15   [provider]  LORazepam (ATIVAN) 1 MG tablet 1-1.5 mg daily as needed for anxiety 03/26/19   [name], MD  Multiple Vitamin (MULTIVITAMIN WITH MINERALS) TABS tablet Take 1 tablet by mouth daily.    [provider]  predniSONE (DELTASONE) 10 MG tablet Take 4 tablets (40 mg total) by mouth daily. 08/12/15   Fredia Sorrow, MD  tiZANidine (ZANAFLEX) 4 MG capsule Take 4 mg by mouth once.    [provider]  traZODone (DESYREL) 50 MG tablet 25-50 mg at night as needed for sleep 03/26/19   [name], MD  venlafaxine XR (EFFEXOR-XR) 150 MG 24 hr capsule Take 187.5 mg daily (150 mg + 37.5 mg) 03/26/19   [name], MD  venlafaxine XR (EFFEXOR-XR) 37.5 MG 24 hr capsule Take 187.5 mg daily (150 mg + 37.5 mg) 03/26/19   Hisada, Elie Goody, MD  VENTOLIN HFA 108 (90 BASE) MCG/ACT inhaler Inhale 1 puff into the lungs every 6 (six) hours as needed  for shortness of breath.  07/22/15   [provider]    Family History Family History  Problem Relation Age of Onset  . Asthma Other   . COPD Other   . Alcoholism Other   . Heart attack Other   . Depression Other   . Mental illness Other   . Alcohol abuse Mother   . Alcohol abuse Father   . Depression Father     Social History Social History   Tobacco Use  . Smoking status: Current Every Day Smoker    Packs/day: 0.25    Types: Cigarettes  . Smokeless tobacco: Never Used  Substance Use Topics  . Alcohol use: No  . Drug use: No     Allergies   Patient has no known allergies.   Review of Systems Review of Systems  Gastrointestinal: Positive for abdominal pain.  ROS: Statement: All  systems negative except as marked or noted in the HPI; Constitutional: Negative for fever and chills. ; ; Eyes: Negative for eye pain, redness and discharge. ; ; ENMT: Negative for ear pain, hoarseness, nasal congestion, sinus pressure and sore throat. ; ; Cardiovascular: Negative for chest pain, palpitations, diaphoresis, dyspnea and peripheral edema. ; ; Respiratory: Negative for cough, wheezing and stridor. ; ; Gastrointestinal: +N/V/D, abd pain. Negative for blood in stool, hematemesis, jaundice and rectal bleeding. . ; ; Genitourinary: Negative for dysuria, flank pain and hematuria. ; ; Musculoskeletal: Negative for back pain and neck pain. Negative for swelling and trauma.; ; Skin: Negative for pruritus, rash, abrasions, blisters, bruising and skin lesion.; ; Neuro: Negative for headache, lightheadedness and neck stiffness. Negative for weakness, altered level of consciousness, altered mental status, extremity weakness, paresthesias, involuntary movement, seizure and syncope.        Physical Exam Updated Vital Signs BP (!) 157/86 (BP Location: Left Arm)   Pulse 70   Temp 97.6 F (36.4 C) (Oral)   Resp 18   Ht 5\' 5"  (1.651 m)   SpO2 94%   BMI 35.94 kg/m    Patient Vitals for the past 24 hrs:  BP Temp Temp src Pulse Resp SpO2 Height  05/14/19 2154 (!) 112/97 - - 87 (!) 21 96 % -  05/14/19 2100 (!) 136/92 - - 71 17 99 % -  05/14/19 2030 (!) 155/79 - - 63 - 95 % -  05/14/19 2012 (!) 157/86 97.6 F (36.4 C) Oral 70 18 94 % 5\' 5"  (1.651 m)     Physical Exam 2020: Physical examination:  Nursing notes reviewed; Vital signs and O2 SAT reviewed;  Constitutional: Well developed, Well nourished, Well hydrated, Thrashing about and rolling around on stretcher; Head:  Normocephalic, atraumatic; Eyes: EOMI, PERRL, No scleral icterus; ENMT: Mouth and pharynx normal, Mucous membranes moist; Neck: Supple, Full range of motion, No lymphadenopathy; Cardiovascular: Regular rate and rhythm, No gallop;  Respiratory: Breath sounds clear & equal bilaterally, No wheezes.  Speaking full sentences with ease, Normal respiratory effort/excursion; Chest: Nontender, Movement normal; Abdomen: +making herself gag and dry heave during exam. Soft, +mild RLQ tenderness to palp. No rebound or guarding. Nondistended, Normal bowel sounds; Genitourinary: No CVA tenderness; Extremities: Peripheral pulses normal, No tenderness, No edema, No calf edema or asymmetry.; Neuro: AA&Ox3, Major CN grossly intact.  Speech clear. No gross focal motor or sensory deficits in extremities.; Skin: Color normal, Warm, Dry.    ED Treatments / Results  Labs (all labs ordered are listed, but only abnormal results are displayed)   EKG  Radiology   Procedures Procedures (including critical care time)  Medications Ordered in ED Medications  sodium chloride 0.9 % bolus 1,000 mL (has no administration in time range)  ondansetron (ZOFRAN) injection 4 mg (has no administration in time range)  famotidine (PEPCID) IVPB 20 mg premix (has no administration in time range)  fentaNYL (SUBLIMAZE) injection 50 mcg (has no administration in time range)     Initial Impression / Assessment and Plan / ED Course  I have reviewed the triage vital signs and the nursing notes.  Pertinent labs & imaging results that were available during my care of the patient were reviewed by me and considered in my medical decision making (see chart for details).     MDM Reviewed: previous chart, nursing note and vitals Reviewed previous: labs and ECG Interpretation: labs, ECG, x-ray and CT scan    ED ECG REPORT   Date: 05/14/2019  Rate: 70  Rhythm: normal sinus rhythm  QRS Axis: normal  Intervals: normal  ST/T Wave abnormalities: normal  Conduction Disutrbances:none  Narrative Interpretation:   Old EKG Reviewed: unchanged; no significant changes from previous EKG dated 08/12/2015 I have personally reviewed the EKG tracing and agree with  the computerized printout as noted.   Results for orders placed or performed during the hospital encounter of 05/14/19  Comprehensive metabolic panel  Result Value Ref Range   Sodium 140 135 - 145 mmol/L   Potassium 3.8 3.5 - 5.1 mmol/L   Chloride 102 98 - 111 mmol/L   CO2 25 22 - 32 mmol/L   Glucose, Bld 146 (H) 70 - 99 mg/dL   BUN 8 6 - 20 mg/dL   Creatinine, Ser 0.61 0.44 - 1.00 mg/dL   Calcium 9.2 8.9 - 10.3 mg/dL   Total Protein 7.9 6.5 - 8.1 g/dL   Albumin 4.5 3.5 - 5.0 g/dL   AST 20 15 - 41 U/L   ALT 23 0 - 44 U/L   Alkaline Phosphatase 68 38 - 126 U/L   Total Bilirubin 0.4 0.3 - 1.2 mg/dL   GFR calc non Af Amer >60 >60 mL/min   GFR calc Af Amer >60 >60 mL/min   Anion gap 13 5 - 15  Lipase, blood  Result Value Ref Range   Lipase 37 11 - 51 U/L  Troponin I - Once  Result Value Ref Range   Troponin I <0.03 <0.03 ng/mL  CBC with Differential  Result Value Ref Range   WBC 14.2 (H) 4.0 - 10.5 K/uL   RBC 5.72 (H) 3.87 - 5.11 MIL/uL   Hemoglobin 17.0 (H) 12.0 - 15.0 g/dL   HCT 53.0 (H) 36.0 - 46.0 %   MCV 92.7 80.0 - 100.0 fL   MCH 29.7 26.0 - 34.0 pg   MCHC 32.1 30.0 - 36.0 g/dL   RDW 13.2 11.5 - 15.5 %   Platelets 262 150 - 400 K/uL   nRBC 0.0 0.0 - 0.2 %   Neutrophils Relative % 73 %   Neutro Abs 10.3 (H) 1.7 - 7.7 K/uL   Lymphocytes Relative 19 %   Lymphs Abs 2.7 0.7 - 4.0 K/uL   Monocytes Relative 7 %   Monocytes Absolute 1.1 (H) 0.1 - 1.0 K/uL   Eosinophils Relative 0 %   Eosinophils Absolute 0.0 0.0 - 0.5 K/uL   Basophils Relative 0 %   Basophils Absolute 0.1 0.0 - 0.1 K/uL   Immature Granulocytes 1 %   Abs Immature Granulocytes 0.07 0.00 - 0.07 K/uL  Ethanol  Result Value Ref Range   Alcohol, Ethyl (B) <10 <10 mg/dL    Dg Chest 2 View Result Date: 05/14/2019 CLINICAL DATA:  Nausea, vomiting EXAM: CHEST - 2 VIEW COMPARISON:  11/28/2017 FINDINGS: Heart and mediastinal contours are within normal limits. No focal opacities or effusions. No acute bony  abnormality. IMPRESSION: No active cardiopulmonary disease. Electronically Signed   By: Rolm Baptise M.D.   On: 05/14/2019 22:33   Ct Abdomen Pelvis W Contrast Result Date: 05/14/2019 CLINICAL DATA:  Upper abdominal pain, nausea, vomiting EXAM: CT ABDOMEN AND PELVIS WITH CONTRAST TECHNIQUE: Multidetector CT imaging of the abdomen and pelvis was performed using the standard protocol following bolus administration of intravenous contrast. CONTRAST:  115mL OMNIPAQUE IOHEXOL 300 MG/ML  SOLN COMPARISON:  None. FINDINGS: Lower chest: Lung bases are clear. No effusions. Heart is normal size. Hepatobiliary: Diffuse low-density throughout the liver compatible with fatty infiltration. No focal abnormality. Gallbladder unremarkable. Pancreas: No focal abnormality or ductal dilatation. Spleen: No focal abnormality.  Normal size. Adrenals/Urinary Tract: Bilateral renal cysts appear benign. No hydronephrosis. Adrenal glands and urinary bladder unremarkable. Stomach/Bowel: Stomach, large and small bowel grossly unremarkable. Two large duodenal diverticula measuring 4 cm and 5 cm. Vascular/Lymphatic: Aortic atherosclerosis. No enlarged abdominal or pelvic lymph nodes. Reproductive: Prior hysterectomy.  No adnexal masses. Other: No free fluid or free air. Musculoskeletal: No acute bony abnormality. IMPRESSION: Mild fatty infiltration of the liver. Aortic atherosclerosis. No acute findings in the abdomen or pelvis. Electronically Signed   By: Rolm Baptise M.D.   On: 05/14/2019 22:32     2255:   Pt has tol PO well while in the ED without N/V.  No stooling while in the ED.  Abd benign, resps easy, VSS. Pt has ambulated with steady gait, easy resps, NAD. States she is "due" for her nebulizer treatment; will dose before d/c. States her abd feels better and wants to go home now. Tx symptomatically at this time. Dx and testing, d/w pt.  Questions answered.  Verb understanding, agreeable to d/c home with outpt f/u.       Final  Clinical Impressions(s) / ED Diagnoses   Final diagnoses:  None    ED Discharge Orders    None       Francine Graven, DO 05/16/19 1740

## 2019-05-14 NOTE — Discharge Instructions (Signed)
Eat a bland diet, avoiding greasy, fatty, fried foods, as well as spicy and acidic foods or beverages.  Avoid eating within 2 to 3 hours before going to bed or laying down.  Also avoid teas, colas, coffee, chocolate, pepermint and spearment.  Take the prescriptions as directed. May also take over the counter maalox/mylanta, as directed on packaging, as needed for discomfort. Call your regular medical doctor on Monday to schedule a follow up appointment this week. Call the GI doctor and the General Surgeon on Monday to schedule a follow up appointment within the next week.  Return to the Emergency Department immediately if worsening.

## 2019-05-17 DIAGNOSIS — R1011 Right upper quadrant pain: Secondary | ICD-10-CM | POA: Diagnosis not present

## 2019-05-17 DIAGNOSIS — Z6831 Body mass index (BMI) 31.0-31.9, adult: Secondary | ICD-10-CM | POA: Diagnosis not present

## 2019-05-17 DIAGNOSIS — K625 Hemorrhage of anus and rectum: Secondary | ICD-10-CM | POA: Diagnosis not present

## 2019-05-18 NOTE — Progress Notes (Signed)
Virtual Visit via Telephone Note  I connected with Jasmine Werner on 05/21/19 at 10:40 AM EDT by telephone and verified that I am speaking with the correct person using two identifiers.   I discussed the limitations, risks, security and privacy concerns of performing an evaluation and management service by telephone and the availability of in person appointments. I also discussed with the patient that there may be a patient responsible charge related to this service. The patient expressed understanding and agreed to proceed.    I discussed the assessment and treatment plan with the patient. The patient was provided an opportunity to ask questions and all were answered. The patient agreed with the plan and demonstrated an understanding of the instructions.   The patient was advised to call back or seek an in-person evaluation if the symptoms worsen or if the condition fails to improve as anticipated.  I provided 15 minutes of non-face-to-face time during this encounter.   Jasmine Clay, MD    [facility] MD/PA/NP OP Progress Note  05/21/2019 12:30 PM Jasmine Werner  MRN:  [id number]  Chief Complaint:  Chief Complaint    Depression; Follow-up     HPI:  This is a follow-up appointment for depression.  Noted that exam is notable for significant mood lability during phone interview. She would repeatedly cry and then states that "I am fine"  (and talks about her family, who have been supportive to the patient) in the following minute. She states that she has not been doing well. She states that she has had "psychotic episode." She sees some people and hears some voices, although she declined to elaborate it. She denies CAH. She states that she has had diverticulitis and has been evaluated for it. She feels sad and depressed. She also feels unsafe at home due to the people she sees Jasmine Werner). She wonders if she can be admitted. Discussed with the patient that although the option would be open (with access  to emergency recourses), it would be beneficial to first try adjusting her medication.  After discussing at length, she is amenable to change her medication. She has insomnia.  She has anhedonia.  She has difficulty concentration.  She denies SI, HI.  She feels anxious and tense.  She has had panic attacks.  She has been smoking pod every day. She self tapered down venlafaxine due to some side effect (details unknown).  She  Visit Diagnosis:    ICD-10-CM   1. MDD (major depressive disorder), recurrent episode, moderate (H[facility])  F33.1   2. Posttraumatic stress disorder  F43.10     Past Psychiatric History: Please see initial evaluation for full details. I have reviewed the history. No updates at this time.     Past Medical History:  Past Medical History:  Diagnosis Date  . Alcohol use   . Allergy   . Anxiety   . Asthma   . Chronic abdominal pain   . COPD (chronic obstructive pulmonary disease) ([place])     Past Surgical History:  Procedure Laterality Date  . ABDOMINAL HYSTERECTOMY    . APPENDECTOMY      Family Psychiatric History: Please see initial evaluation for full details. I have reviewed the history. No updates at this time.     Family History:  Family History  Problem Relation Age of Onset  . Asthma Other   . COPD Other   . Alcoholism Other   . Heart attack Other   . Depression Other   . Mental illness Other   .  Alcohol abuse Mother   . Alcohol abuse Father   . Depression Father     Social History:  Social History   Socioeconomic History  . Marital status: Widowed    Spouse name: Not on file  . Number of children: 5  . Years of education: Not on file  . Highest education level: Some college, no degree  Occupational History  . Not on file  Social Needs  . Financial resource strain: Not hard at all  . Food insecurity    Worry: Sometimes true    Inability: Never true  . Transportation needs    Medical: No    Non-medical: No  Tobacco Use  . Smoking  status: Current Every Day Smoker    Packs/day: 0.25    Types: Cigarettes  . Smokeless tobacco: Never Used  Substance and Sexual Activity  . Alcohol use: No  . Drug use: No  . Sexual activity: Never  Lifestyle  . Physical activity    Days per week: Not on file    Minutes per session: Not on file  . Stress: Very much  Relationships  . Social Herbalist on phone: Not on file    Gets together: Not on file    Attends religious service: Not on file    Active member of club or organization: Not on file    Attends meetings of clubs or organizations: More than 4 times per year    Relationship status: Widowed  Other Topics Concern  . Not on file  Social History Narrative  . Not on file    Allergies: No Known Allergies  Metabolic Disorder Labs: No results found for: HGBA1C, MPG No results found for: PROLACTIN No results found for: CHOL, TRIG, HDL, CHOLHDL, VLDL, LDLCALC No results found for: TSH  Therapeutic Level Labs: No results found for: LITHIUM No results found for: VALPROATE No components found for:  CBMZ  Current Medications: Current Outpatient Medications  Medication Sig Dispense Refill  . ARIPiprazole (ABILIFY) 2 MG tablet Take 1 tablet (2 mg total) by mouth daily. 30 tablet 0  . aspirin EC 81 MG tablet Take 81 mg by mouth daily.    . budesonide-formoterol (SYMBICORT) 160-4.5 MCG/ACT inhaler Inhale 2 puffs into the lungs 2 (two) times daily.    Marland Kitchen dextromethorphan-guaiFENesin (MUCINEX DM) 30-600 MG per 12 hr tablet Take 1 tablet by mouth 2 (two) times daily. 14 tablet 0  . diclofenac (CATAFLAM) 50 MG tablet Take 1 tablet (50 mg total) by mouth 2 (two) times daily. 60 tablet 3  . famotidine (PEPCID) 20 MG tablet Take 1 tablet (20 mg total) by mouth daily. 15 tablet 0  . ipratropium-albuterol (DUONEB) 0.5-2.5 (3) MG/3ML SOLN Inhale 3 mLs into the lungs every 6 (six) hours as needed (Shortness of Breath).   2  . LORazepam (ATIVAN) 1 MG tablet 1-1.5 mg daily as  needed for anxiety 45 tablet 0  . Multiple Vitamin (MULTIVITAMIN WITH MINERALS) TABS tablet Take 1 tablet by mouth daily.    . ondansetron (ZOFRAN ODT) 4 MG disintegrating tablet Take 1 tablet (4 mg total) by mouth every 8 (eight) hours as needed for nausea or vomiting. 6 tablet 0  . predniSONE (DELTASONE) 10 MG tablet Take 4 tablets (40 mg total) by mouth daily. 20 tablet 0  . tiZANidine (ZANAFLEX) 4 MG capsule Take 4 mg by mouth once.    . traMADol (ULTRAM) 50 MG tablet Take 50 mg by mouth every 6 (six) hours as  needed for moderate pain.    . traZODone (DESYREL) 50 MG tablet 25-50 mg at night as needed for sleep 90 tablet 0  . venlafaxine XR (EFFEXOR-XR) 150 MG 24 hr capsule Take 187.5 mg daily (150 mg + 37.5 mg) 90 capsule 0  . VENTOLIN HFA 108 (90 BASE) MCG/ACT inhaler Inhale 1 puff into the lungs every 6 (six) hours as needed for shortness of breath.   2   No current facility-administered medications for this visit.      Musculoskeletal: Strength & Muscle Tone: N/A Gait & Station: N/A Patient leans: N/A  Psychiatric Specialty Exam: Review of Systems  Psychiatric/Behavioral: Positive for depression, hallucinations and substance abuse. Negative for memory loss and suicidal ideas. The patient is nervous/anxious and has insomnia.   All other systems reviewed and are negative.   There were no vitals taken for this visit.There is no height or weight on file to calculate BMI.  General Appearance: NA  Eye Contact:  NA  Speech:  Clear and Coherent  Volume:  Normal  Mood:  Depressed  Affect:  NA  Thought Process:  Coherent, disorganized at times  Orientation:  Full (Time, Place, and Person)  Thought Content: Logical   Suicidal Thoughts:  No  Homicidal Thoughts:  No  Memory:  Immediate;   Good  Judgement:  Good  Insight:  Fair  Psychomotor Activity:  Normal  Concentration:  Concentration: Good and Attention Span: Good  Recall:  Good  Fund of Knowledge: Good  Language: Good   Akathisia:  No  Handed:  Right  AIMS (if indicated): not done  Assets:  Communication Skills Desire for Improvement  ADL's:  Intact  Cognition: WNL  Sleep:  Poor   Screenings:   Assessment and Plan:  Jasmine Werner is a 63 y.o. year old female with a history of depression, alcohol use disorder in sustained remission, COPD,type II diabetes, who presents for follow up appointment for depression.   # MDD, moderate, recurrent with psychotic features #r/o marijuana induced mood disorder Exam is notable for significant mood lability and patient reports depressive symptoms with psychotic features of AH/VH, although he declines to elaborate it.  She denies CAH.  Will add Abilify as adjunctive treatment for depression.  Noted that she self tapered venlafaxine due to some side effect (details unknown).  Will continue venlafaxine at the current dose to target depression.  Will continue trazodone as needed for insomnia.  Will continue Ativan as needed for anxiety.  Discussed risk of fall and oversedation.  Will do sooner follow-up given recent significant worsening in her mood symptoms. She has not been able to see her therapist.  Emergency resources are discussed as below.   # pod use It is unclear whether she was under the influence of pod during the interview. Will continue to evaluate and provide motivational interview.   Plan I have reviewed and updated plans as below 1. Continue Effexor 150 mg daily (she self tapered) 2. Start Abilify 2 mg daily 2. ContinueTrazodone 25- 50 mg at night as needed for sleep  3.Continue lorazepamup to 1.5mg  daily as needed for anxiety 4. Next appointment: 6/25 at 10:40 for 20 mins, video Emergency resources which includes 911, ED, suicide crisis line [phone]) are discussed.    The patient demonstrates the following risk factors for suicide: Chronic risk factors for suicide include:psychiatric disorder ofdepression. Acute risk factorsfor  suicide include: unemployment. Protective factorsfor this patient include: responsibility to others (children, family), coping skills and hope for the future.  Considering these factors, the overall suicide risk at this point appears to bemoderate, but not at imminent risk to self. Patientisappropriate for outpatient follow up. She denies gun access at home.  Jasmine Clay, MD 05/21/2019, 12:30 PM

## 2019-05-21 ENCOUNTER — Ambulatory Visit (INDEPENDENT_AMBULATORY_CARE_PROVIDER_SITE_OTHER): Payer: Medicaid Other | Admitting: Psychiatry

## 2019-05-21 ENCOUNTER — Other Ambulatory Visit: Payer: Self-pay

## 2019-05-21 ENCOUNTER — Encounter (HOSPITAL_COMMUNITY): Payer: Self-pay | Admitting: Psychiatry

## 2019-05-21 DIAGNOSIS — F331 Major depressive disorder, recurrent, moderate: Secondary | ICD-10-CM | POA: Diagnosis not present

## 2019-05-21 DIAGNOSIS — F4310 Post-traumatic stress disorder, unspecified: Secondary | ICD-10-CM | POA: Diagnosis not present

## 2019-05-21 MED ORDER — LORAZEPAM 1 MG PO TABS: ORAL_TABLET | ORAL | 0 refills | Status: DC

## 2019-05-21 MED ORDER — ARIPIPRAZOLE 2 MG PO TABS: 2.0000 mg | ORAL_TABLET | Freq: Every day | ORAL | 0 refills | Status: DC

## 2019-05-21 NOTE — Patient Instructions (Signed)
1. Continue Effexor 150 mg daily  2. Start Abilify 2 mg daily 2. ContinueTrazodone 25- 50 mg at night as needed for sleep  3.Continue lorazepamup to 1.5mg  daily as needed for anxiety 4. Next appointment: 6/25 at 10:40  CONTACT INFORMATION  What to do if you need to get in touch with someone regarding a psychiatric issue:  1. EMERGENCY: For psychiatric emergencies (if you are suicidal or if there are any other safety issues) call 911 and/or go to your nearest Emergency Room immediately.   2. IF YOU NEED SOMEONE TO TALK TO RIGHT NOW: Given my clinical responsibilities, I may not be able to speak with you over the phone for a prolonged period of time.  a. You may always call The National Suicide Prevention Lifeline at 1-800-273-TALK [phone]).  b. Your county of residence will also have local crisis services. For [facility]: [place] at [phone] ([place])

## 2019-05-24 DIAGNOSIS — K625 Hemorrhage of anus and rectum: Secondary | ICD-10-CM | POA: Diagnosis not present

## 2019-05-24 DIAGNOSIS — R1011 Right upper quadrant pain: Secondary | ICD-10-CM | POA: Diagnosis not present

## 2019-05-24 DIAGNOSIS — N281 Cyst of kidney, acquired: Secondary | ICD-10-CM | POA: Diagnosis not present

## 2019-05-26 ENCOUNTER — Other Ambulatory Visit: Payer: Self-pay

## 2019-05-26 ENCOUNTER — Telehealth: Payer: Self-pay

## 2019-05-26 ENCOUNTER — Encounter: Payer: Self-pay | Admitting: Gastroenterology

## 2019-05-26 ENCOUNTER — Ambulatory Visit (INDEPENDENT_AMBULATORY_CARE_PROVIDER_SITE_OTHER): Payer: Medicare Other | Admitting: Gastroenterology

## 2019-05-26 VITALS — BP 133/83 | HR 117 | Temp 97.4°F | Ht 65.0 in | Wt 190.0 lb

## 2019-05-26 DIAGNOSIS — R109 Unspecified abdominal pain: Secondary | ICD-10-CM | POA: Diagnosis not present

## 2019-05-26 DIAGNOSIS — R634 Abnormal weight loss: Secondary | ICD-10-CM | POA: Insufficient documentation

## 2019-05-26 DIAGNOSIS — K625 Hemorrhage of anus and rectum: Secondary | ICD-10-CM | POA: Diagnosis not present

## 2019-05-26 MED ORDER — PEG 3350-KCL-NA BICARB-NACL 420 G PO SOLR: 4000.0000 mL | ORAL | 0 refills | Status: AC

## 2019-05-26 NOTE — Assessment & Plan Note (Signed)
Concerning. Colonoscopy for rectal bleeding and EGD for abdominal pain, N/V scheduled. CT and [place] on file. HIDA scheduled. Labs unrevealing. Multifactorial in setting of decreased oral intake. Follow closely.

## 2019-05-26 NOTE — Telephone Encounter (Signed)
TCS/EGD w/Propofol w/RMR was scheduled for 08/19/19 at 11:00am this am during OV. Pre-op appt 08/16/19 at 9:00am. Pre-op appt letter mailed with procedure instructions.

## 2019-05-26 NOTE — Patient Instructions (Signed)
For constipation: start taking Amitiza 1 gelcap with food each evening. We can increase this to twice a day with food if needed. A side effect is nausea, so make sure you take it with food. Please let me know how this works.  We are doing a gallbladder scan to see how it functions.  We have arranged a colonoscopy with upper endoscopy in the near future.  Further recommendations to follow!  It was a pleasure to see you today. I strive to create trusting relationships with patients to provide genuine, compassionate, and quality care. I value your feedback. If you receive a survey regarding your visit,  I greatly appreciate you taking time to fill this out.   Annitta Needs, PhD, ANP-BC [facility]

## 2019-05-26 NOTE — Progress Notes (Signed)
Primary Care Physician:  Caryl Bis, MD Primary Gastroenterologist:  Dr. Gala Romney   Chief Complaint  Patient presents with  . Rectal Bleeding    Wt loss,Pt has extreme pain when eating    HPI:   Jasmine Werner is a 63 y.o. female presenting today at the request of Dr. Quillian Quince due to rectal bleeding. History of chronic abdominal pain, with prior recent evaluation including CT June 2020, US abdomen in [place] without gallstones, +Fatty liver, labs overall unrevealing.   Anxious at time of visit and difficult historian. Notes pain is "everywhere". Pain can be in right flank and feel like punching, may be in RUQ, epigastric. Can feel like her "gallbladder is swollen". Eating worsens. Ate 2 vegetables and piece of fried chicken then had severe pain. Biscuits and gravy with pain. Now eating fruit cups, yogurt, whole grain bread. Soups. Small portions. Has changed what she eats as noticed worsening with multiple foods. Scared to eat.  Pain is not always triggered by food. Difficult pinpointing pain when hurting bad. Epigastric discomfort. RLQ discomfort. No typical indigestion. Intermittent N/V. Nausea can be so severe she vomits "bile". Unpredictable. No dysphagia. Bough store-brand Aleve to take and prior to this was aspirin.   Baseline weight 235. Wt 190 now. Lost weight since Aug 2019.   Rectal bleeding: intermittently a few times. Had clots over the weekend in early June 2020. Chronically constipated. [place] stool scale #1. Has Miralax but not taking. Would work in a few days. Pain not relieved by BM.   Hears voices screaming at her, and Abilify has helped lessen this. History of childhood schizophrenia.    Colonoscopy: at least 3-4 years ago at [facility]. No polyps that she was aware.    Past Medical History:  Diagnosis Date  . Alcohol use   . Allergy   . Anxiety   . Asthma   . Childhood schizophrenia   . Chronic abdominal pain   . COPD (chronic obstructive pulmonary disease) ([place])      Past Surgical History:  Procedure Laterality Date  . ABDOMINAL HYSTERECTOMY    . APPENDECTOMY      Current Outpatient Medications  Medication Sig Dispense Refill  . ARIPiprazole (ABILIFY) 2 MG tablet Take 1 tablet (2 mg total) by mouth daily. 30 tablet 0  . budesonide-formoterol (SYMBICORT) 160-4.5 MCG/ACT inhaler Inhale 2 puffs into the lungs 2 (two) times daily.    Marland Kitchen ipratropium-albuterol (DUONEB) 0.5-2.5 (3) MG/3ML SOLN Inhale 3 mLs into the lungs every 6 (six) hours as needed (Shortness of Breath).   2  . LORazepam (ATIVAN) 1 MG tablet 1-1.5 mg daily as needed for anxiety 45 tablet 0  . tiZANidine (ZANAFLEX) 4 MG capsule Take 4 mg by mouth once.    . traZODone (DESYREL) 50 MG tablet 25-50 mg at night as needed for sleep 90 tablet 0  . venlafaxine XR (EFFEXOR-XR) 150 MG 24 hr capsule Take 187.5 mg daily (150 mg + 37.5 mg) 90 capsule 0  . VENTOLIN HFA 108 (90 BASE) MCG/ACT inhaler Inhale 1 puff into the lungs every 6 (six) hours as needed for shortness of breath.   2  . polyethylene glycol-electrolytes (TRILYTE) 420 g solution Take 4,000 mLs by mouth as directed. 4000 mL 0   No current facility-administered medications for this visit.     Allergies as of 05/26/2019 - Review Complete 05/26/2019  Allergen Reaction Noted  . Tramadol Nausea And Vomiting 05/26/2019    Family History  Problem Relation Age of  Onset  . Asthma Other   . COPD Other   . Alcoholism Other   . Heart attack Other   . Depression Other   . Mental illness Other   . Alcohol abuse Mother   . Colon cancer Mother 30  . Alcohol abuse Father   . Depression Father     Social History   Socioeconomic History  . Marital status: Widowed    Spouse name: Not on file  . Number of children: 5  . Years of education: Not on file  . Highest education level: Some college, no degree  Occupational History  . Not on file  Social Needs  . Financial resource strain: Not hard at all  . Food insecurity    Worry:  Sometimes true    Inability: Never true  . Transportation needs    Medical: No    Non-medical: No  Tobacco Use  . Smoking status: Current Every Day Smoker    Packs/day: 0.25    Types: Cigarettes  . Smokeless tobacco: Never Used  Substance and Sexual Activity  . Alcohol use: No  . Drug use: Yes    Types: Marijuana  . Sexual activity: Never  Lifestyle  . Physical activity    Days per week: Not on file    Minutes per session: Not on file  . Stress: Very much  Relationships  . Social Herbalist on phone: Not on file    Gets together: Not on file    Attends religious service: Not on file    Active member of club or organization: Not on file    Attends meetings of clubs or organizations: More than 4 times per year    Relationship status: Widowed  . Intimate partner violence    Fear of current or ex partner: No    Emotionally abused: No    Physically abused: No    Forced sexual activity: No  Other Topics Concern  . Not on file  Social History Narrative  . Not on file    Review of Systems: Gen: see HPI CV: Denies chest pain, heart palpitations, peripheral edema, syncope.  Resp: Denies shortness of breath at rest or with exertion. Denies wheezing or cough.  GI: see HPI  GU : Denies urinary burning, urinary frequency, urinary hesitancy MS: Denies joint pain, muscle weakness, cramps, or limitation of movement.  Derm: Denies rash, itching, dry skin Psych: see HPI Heme: see HPI  Physical Exam: BP 133/83   Pulse (!) 117   Temp (!) 97.4 F (36.3 C)   Ht 5\' 5"  (1.651 m)   Wt 190 lb (86.2 kg)   BMI 31.62 kg/m  General:   Alert and oriented. Pleasant but highly anxious.   Head:  Normocephalic and atraumatic. Eyes:  Without icterus, sclera clear and conjunctiva pink.  Ears:  Normal auditory acuity. Nose:  No deformity, discharge,  or lesions. Mouth:  No deformity or lesions, oral mucosa pink.  Lungs:  Clear to auscultation bilaterally. No wheezes, rales, or  rhonchi. No distress.  Heart:  S1, S2 present without murmurs appreciated.  Abdomen:  +BS, soft, TTP LUQ/epigastric, moreso TTP RUQ/RLQ Rectal:  Deferred  Msk:  Symmetrical without gross deformities. Normal posture. Extremities:  Without  edema. Neurologic:  Alert and  oriented x4 Psych:  Alert and cooperative.

## 2019-05-26 NOTE — Assessment & Plan Note (Signed)
63 year old female, difficult historian, presenting with abdominal pain of multiple sites but predominantly epigastric and RUQ, worsened postprandially, N/V. Intermittent NSAID use but concern for biliary etiology as she notes direct correlation with fatty foods and pain. [place] without gallstones. Weight loss is concerning and likely multifactorial in setting of decreased oral intake, bland diet. Symptoms not classic for mesenteric ischemia but will need to follow closely. CT imaging and [place] on file, with labs unrevealing thus far. Recommend HIDA scan in near future, EGD, and consider CTA if all unrevealing.  HIDA scan to be scheduled Proceed with upper endoscopy in the near future with Dr. Gala Romney. The risks, benefits, and alternatives have been discussed in detail with patient. They have stated understanding and desire to proceed.  Propofol due to polypharmacy

## 2019-05-26 NOTE — Progress Notes (Signed)
cc'ed to pcp °

## 2019-05-26 NOTE — Assessment & Plan Note (Signed)
Intermittent low-volume bleeding with one episode of clots recently in setting of constipation. Last colonoscopy at least 3-4 years ago per patient. Family history of colorectal cancer with mother diagnosed age 61. Early interval colonoscopy recommended now.  Proceed with TCS with Dr. Gala Romney in near future: the risks, benefits, and alternatives have been discussed with the patient in detail. The patient states understanding and desires to proceed. Propofol due to polypharmacy Start Amitiza 24 mcg daily with supper and increase to BID if tolerated. Samples provided.

## 2019-05-27 ENCOUNTER — Other Ambulatory Visit: Payer: Self-pay

## 2019-05-27 ENCOUNTER — Encounter (HOSPITAL_COMMUNITY): Payer: Self-pay | Admitting: Psychiatry

## 2019-05-27 ENCOUNTER — Ambulatory Visit (INDEPENDENT_AMBULATORY_CARE_PROVIDER_SITE_OTHER): Payer: Medicare Other | Admitting: Psychiatry

## 2019-05-27 DIAGNOSIS — F331 Major depressive disorder, recurrent, moderate: Secondary | ICD-10-CM

## 2019-05-27 MED ORDER — TRAZODONE HCL 50 MG PO TABS: ORAL_TABLET | ORAL | 0 refills | Status: DC

## 2019-05-27 MED ORDER — ARIPIPRAZOLE 2 MG PO TABS: 2.0000 mg | ORAL_TABLET | Freq: Every day | ORAL | 0 refills | Status: DC

## 2019-05-27 MED ORDER — LORAZEPAM 1 MG PO TABS: ORAL_TABLET | ORAL | 1 refills | Status: DC

## 2019-05-27 MED ORDER — VENLAFAXINE HCL ER 150 MG PO CP24: 150.0000 mg | ORAL_CAPSULE | Freq: Every day | ORAL | 0 refills | Status: DC

## 2019-05-27 NOTE — Progress Notes (Signed)
Virtual Visit via Video Note  I connected with Jasmine Werner on 05/27/19 at 10:40 AM EDT by a video enabled telemedicine application and verified that I am speaking with the correct person using two identifiers.   I discussed the limitations of evaluation and management by telemedicine and the availability of in person appointments. The patient expressed understanding and agreed to proceed.    I discussed the assessment and treatment plan with the patient. The patient was provided an opportunity to ask questions and all were answered. The patient agreed with the plan and demonstrated an understanding of the instructions.   The patient was advised to call back or seek an in-person evaluation if the symptoms worsen or if the condition fails to improve as anticipated.  I provided 15 minutes of non-face-to-face time during this encounter.   [name], MD     [facility] MD/PA/NP OP Progress Note  05/27/2019 11:14 AM Jasmine Werner  MRN:  [id number]  Chief Complaint:  Chief Complaint    Depression; Follow-up     HPI:  This is a follow-up appointment for depression.  She states that she has been feeling better since starting Abilify.  However in the next minute, she fearfully describes that she thinks she has pancreatic cancer.  She states that she was not told this diagnosis by her provider, but took MRI. She is advised to contact her provider to hear the result before making any guess about her diagnosis.  She states that she has been feeling anxious, although she has been getting it better compared to before.  It has been difficult for the patient to communicate well with other people.  She uses pod every day for nausea.  She agrees that pod may have been affecting her mood, although she is unsure if she would like to discontinue it.  She sleeps up to 12 hours.  She feels less depressed.  She has fair concentration.  She has decreased appetite.  She denies SI, HI, hallucinations.  She has  occasional panic attacks. She takes lorazepam occasionally for anxiety.   Visit Diagnosis:    ICD-10-CM   1. MDD (major depressive disorder), recurrent episode, moderate ([place])  F33.1     Past Psychiatric History: Please see initial evaluation for full details. I have reviewed the history. No updates at this time.     Past Medical History:  Past Medical History:  Diagnosis Date  . Alcohol use   . Allergy   . Anxiety   . Asthma   . Childhood schizophrenia   . Chronic abdominal pain   . COPD (chronic obstructive pulmonary disease) ([place])     Past Surgical History:  Procedure Laterality Date  . ABDOMINAL HYSTERECTOMY    . APPENDECTOMY      Family Psychiatric History: Please see initial evaluation for full details. I have reviewed the history. No updates at this time.     Family History:  Family History  Problem Relation Age of Onset  . Asthma Other   . COPD Other   . Alcoholism Other   . Heart attack Other   . Depression Other   . Mental illness Other   . Alcohol abuse Mother   . Colon cancer Mother 66  . Alcohol abuse Father   . Depression Father     Social History:  Social History   Socioeconomic History  . Marital status: Widowed    Spouse name: Not on file  . Number of children: 5  . Years of education:  Not on file  . Highest education level: Some college, no degree  Occupational History  . Not on file  Social Needs  . Financial resource strain: Not hard at all  . Food insecurity    Worry: Sometimes true    Inability: Never true  . Transportation needs    Medical: No    Non-medical: No  Tobacco Use  . Smoking status: Current Every Day Smoker    Packs/day: 0.25    Types: Cigarettes  . Smokeless tobacco: Never Used  Substance and Sexual Activity  . Alcohol use: No  . Drug use: Yes    Types: Marijuana  . Sexual activity: Never  Lifestyle  . Physical activity    Days per week: Not on file    Minutes per session: Not on file  . Stress: Very  much  Relationships  . Social Herbalist on phone: Not on file    Gets together: Not on file    Attends religious service: Not on file    Active member of club or organization: Not on file    Attends meetings of clubs or organizations: More than 4 times per year    Relationship status: Widowed  Other Topics Concern  . Not on file  Social History Narrative  . Not on file    Allergies:  Allergies  Allergen Reactions  . Tramadol Nausea And Vomiting    Metabolic Disorder Labs: No results found for: HGBA1C, MPG No results found for: PROLACTIN No results found for: CHOL, TRIG, HDL, CHOLHDL, VLDL, LDLCALC No results found for: TSH  Therapeutic Level Labs: No results found for: LITHIUM No results found for: VALPROATE No components found for:  CBMZ  Current Medications: Current Outpatient Medications  Medication Sig Dispense Refill  . [START ON 06/18/2019] ARIPiprazole (ABILIFY) 2 MG tablet Take 1 tablet (2 mg total) by mouth daily. 30 tablet 0  . budesonide-formoterol (SYMBICORT) 160-4.5 MCG/ACT inhaler Inhale 2 puffs into the lungs 2 (two) times daily.    Marland Kitchen ipratropium-albuterol (DUONEB) 0.5-2.5 (3) MG/3ML SOLN Inhale 3 mLs into the lungs every 6 (six) hours as needed (Shortness of Breath).   2  . [START ON 06/18/2019] LORazepam (ATIVAN) 1 MG tablet 1-1.5 mg daily as needed for anxiety 45 tablet 1  . polyethylene glycol-electrolytes (TRILYTE) 420 g solution Take 4,000 mLs by mouth as directed. 4000 mL 0  . tiZANidine (ZANAFLEX) 4 MG capsule Take 4 mg by mouth once.    Derrill Memo ON 06/25/2019] traZODone (DESYREL) 50 MG tablet 25-50 mg at night as needed for sleep 90 tablet 0  . [START ON 06/25/2019] venlafaxine XR (EFFEXOR-XR) 150 MG 24 hr capsule Take 1 capsule (150 mg total) by mouth daily. 90 capsule 0  . VENTOLIN HFA 108 (90 BASE) MCG/ACT inhaler Inhale 1 puff into the lungs every 6 (six) hours as needed for shortness of breath.   2   No current facility-administered  medications for this visit.      Musculoskeletal: Strength & Muscle Tone: N/A Gait & Station: N/A Patient leans: N/A  Psychiatric Specialty Exam: Review of Systems  Psychiatric/Behavioral: Positive for depression. Negative for hallucinations, memory loss, substance abuse and suicidal ideas. The patient is nervous/anxious and has insomnia.   All other systems reviewed and are negative.   There were no vitals taken for this visit.There is no height or weight on file to calculate BMI.  General Appearance: Fairly Groomed  Eye Contact:  Good  Speech:  Clear and  Coherent  Volume:  Normal  Mood:  Depressed  Affect:  Appropriate, Congruent and bright, smiles, less labile  Thought Process:  Coherent  Orientation:  Full (Time, Place, and Person)  Thought Content: Logical   Suicidal Thoughts:  No  Homicidal Thoughts:  No  Memory:  Immediate;   Good  Judgement:  Good  Insight:  Fair  Psychomotor Activity:  Normal  Concentration:  Concentration: Good and Attention Span: Good  Recall:  Good  Fund of Knowledge: Good  Language: Good  Akathisia:  No  Handed:  Right  AIMS (if indicated): not done  Assets:  Communication Skills Desire for Improvement  ADL's:  Intact  Cognition: WNL  Sleep:  Poor   Screenings:   Assessment and Plan:  Jasmine Werner is a 63 y.o. year old female with a history of depression,  alcohol use disorder in sustained remission, COPD,type II diabetes, who presents for follow up appointment for depression.   # MDD, moderate, recurrent with psychotic features #r/o marijuana induced mood disorder Exam is notable for less mood lability, and she reports improvement in hallucinations since starting Abilify.  Noted that patient has been using marijuana every day for nausea; she is advised to discuss with her provider for treatment of nausea given when I can close worsening in her mood symptoms.  Will continue venlafaxine at the current dose to target depression.  Will  continue Abilify at the current dose as adjunctive treatment for depression and see whether it exerts its full effect.  Will continue trazodone as needed for insomnia.  Will continue Lorazepam as needed for anxiety.  Discussed risk of fall and oversedation.  Noted that she was reportedly told by her therapy clinic that she would not be able to see a therapist anymore; will make referral inside of our clinic now that they are available for new intake.   # Pod use She is at pre-contemplative stage for pot use.  Provided psychoeducation of its potential adverse effect on her mood.  Will continue motivational interview.   Plan I have reviewed and updated plans as below 1.ContinueEffexor 150 mg daily (she self tapered) 2. Continue Abilify 2 mg daily 2. ContinueTrazodone 25- 50 mg at night as needed for sleep  3.Continue lorazepamup to 1.5mg  daily as needed for anxiety 4. Next appointment: 8/4 at 9:30 for 30 mins, video -  Referral to therapy (patient was reportedly told that she cannot see her therapist anymore) Emergency resources which includes 911, ED, suicide crisis line [phone]) are discussed.   The patient demonstrates the following risk factors for suicide: Chronic risk factors for suicide include:psychiatric disorder ofdepression. Acute risk factorsfor suicide include: unemployment. Protective factorsfor this patient include: responsibility to others (children, family), coping skills and hope for the future. Considering these factors, the overall suicide risk at this point appears to bemoderate, but not at imminent risk to self. Patientisappropriate for outpatient follow up. She denies gun access at home.  The duration of this appointment visit was 25 minutes of non face-to-face time with the patient.  Greater than 50% of this time was spent in counseling, explanation of  diagnosis, planning of further management, and coordination of care.   [name],  MD 05/27/2019, 11:14 AM

## 2019-05-27 NOTE — Patient Instructions (Signed)
1.ContinueEffexor 150 mg daily  2. Continue Abilify 2 mg daily 2. ContinueTrazodone 25- 50 mg at night as needed for sleep  3.Continue lorazepamup to 1.5mg  daily as needed for anxiety 4. Next appointment: 8/4 at 9:30

## 2019-06-01 ENCOUNTER — Other Ambulatory Visit: Payer: Self-pay

## 2019-06-01 ENCOUNTER — Encounter (HOSPITAL_COMMUNITY): Payer: Self-pay

## 2019-06-01 ENCOUNTER — Encounter (HOSPITAL_COMMUNITY)
Admission: RE | Admit: 2019-06-01 | Discharge: 2019-06-01 | Disposition: A | Discharge status: Home / Self Care | Payer: Medicare Other | Source: Ambulatory Visit | Attending: Gastroenterology | Admitting: Gastroenterology

## 2019-06-01 DIAGNOSIS — R112 Nausea with vomiting, unspecified: Secondary | ICD-10-CM | POA: Diagnosis not present

## 2019-06-01 DIAGNOSIS — R1013 Epigastric pain: Secondary | ICD-10-CM | POA: Diagnosis not present

## 2019-06-01 DIAGNOSIS — R109 Unspecified abdominal pain: Secondary | ICD-10-CM | POA: Insufficient documentation

## 2019-06-01 HISTORY — DX: Systemic involvement of connective tissue, unspecified: M35.9

## 2019-06-01 MED ORDER — TECHNETIUM TC 99M MEBROFENIN IV KIT
5.0000 | PACK | Freq: Once | INTRAVENOUS | Status: AC | PRN
  Administered 2019-06-01: 5.4 via INTRAVENOUS

## 2019-06-02 NOTE — Progress Notes (Signed)
HIDA reviewed. Although EF is 42%, this is low end of normal AND she experienced reproduction of symptoms with Ensure. She still needs an EGD with her history; however, let's go ahead and get her plugged in with General Surgery: Drs. Constance Haw or [employer].

## 2019-06-08 ENCOUNTER — Other Ambulatory Visit: Payer: Self-pay | Admitting: *Deleted

## 2019-06-08 DIAGNOSIS — R109 Unspecified abdominal pain: Secondary | ICD-10-CM

## 2019-06-09 ENCOUNTER — Ambulatory Visit (HOSPITAL_COMMUNITY): Payer: Medicare Other | Admitting: Psychiatry

## 2019-06-22 ENCOUNTER — Telehealth: Payer: Self-pay | Admitting: *Deleted

## 2019-06-22 NOTE — Telephone Encounter (Addendum)
RMR will not be available for 9/17 procedure date- need to r/s TCS/EGD with propofol. Called pt-VM not set up.

## 2019-06-23 ENCOUNTER — Ambulatory Visit (HOSPITAL_COMMUNITY): Payer: Medicare Other | Admitting: Psychiatry

## 2019-06-23 ENCOUNTER — Telehealth (HOSPITAL_COMMUNITY): Payer: Self-pay | Admitting: Psychiatry

## 2019-06-23 ENCOUNTER — Other Ambulatory Visit: Payer: Self-pay

## 2019-06-23 NOTE — Telephone Encounter (Signed)
I have mailed a letter to patient asking for her to call our office as we need to r/s her procedure. Endo has placed orders in depot until we hear back from patient.

## 2019-06-23 NOTE — Telephone Encounter (Signed)
Called pt, NA VM note set up

## 2019-06-23 NOTE — Telephone Encounter (Signed)
Therapist attempted to contact patient twice via text for scheduled appointment, no response. Therapist attempted to contact patient via phone , voice mail box was not established.

## 2019-06-28 ENCOUNTER — Other Ambulatory Visit: Payer: Self-pay

## 2019-06-28 ENCOUNTER — Emergency Department (HOSPITAL_COMMUNITY)
Admission: EM | Admit: 2019-06-28 | Discharge: 2019-06-29 | Disposition: A | Discharge status: Other Acute Inpatient Hospital | Payer: Medicare Other | Attending: Emergency Medicine | Admitting: Emergency Medicine

## 2019-06-28 ENCOUNTER — Encounter (HOSPITAL_COMMUNITY): Payer: Self-pay | Admitting: Emergency Medicine

## 2019-06-28 DIAGNOSIS — F17210 Nicotine dependence, cigarettes, uncomplicated: Secondary | ICD-10-CM | POA: Diagnosis not present

## 2019-06-28 DIAGNOSIS — F329 Major depressive disorder, single episode, unspecified: Secondary | ICD-10-CM | POA: Insufficient documentation

## 2019-06-28 DIAGNOSIS — J45909 Unspecified asthma, uncomplicated: Secondary | ICD-10-CM | POA: Diagnosis not present

## 2019-06-28 DIAGNOSIS — Z046 Encounter for general psychiatric examination, requested by authority: Secondary | ICD-10-CM | POA: Diagnosis not present

## 2019-06-28 DIAGNOSIS — R45851 Suicidal ideations: Secondary | ICD-10-CM

## 2019-06-28 DIAGNOSIS — F29 Unspecified psychosis not due to a substance or known physiological condition: Secondary | ICD-10-CM | POA: Diagnosis not present

## 2019-06-28 DIAGNOSIS — Z03818 Encounter for observation for suspected exposure to other biological agents ruled out: Secondary | ICD-10-CM | POA: Insufficient documentation

## 2019-06-28 DIAGNOSIS — Z79899 Other long term (current) drug therapy: Secondary | ICD-10-CM | POA: Diagnosis not present

## 2019-06-28 DIAGNOSIS — E876 Hypokalemia: Secondary | ICD-10-CM | POA: Diagnosis not present

## 2019-06-28 DIAGNOSIS — E860 Dehydration: Secondary | ICD-10-CM | POA: Diagnosis not present

## 2019-06-28 LAB — COMPREHENSIVE METABOLIC PANEL
ALT: 23 U/L (ref 0–44)
AST: 19 U/L (ref 15–41)
Albumin: 4.2 g/dL (ref 3.5–5.0)
Alkaline Phosphatase: 61 U/L (ref 38–126)
Anion gap: 11 (ref 5–15)
BUN: 7 mg/dL (ref 6–20)
CO2: 27 mmol/L (ref 22–32)
Calcium: 8.9 mg/dL (ref 8.9–10.3)
Chloride: 101 mmol/L (ref 98–111)
Creatinine, Ser: 0.61 mg/dL (ref 0.44–1.00)
GFR calc Af Amer: >60 mL/min (ref >60)
GFR calc non Af Amer: >60 mL/min (ref >60)
Glucose, Bld: 98 mg/dL (ref 70–99)
Potassium: 2.9 mmol/L — ABNORMAL LOW (ref 3.5–5.1)
Sodium: 139 mmol/L (ref 135–145)
Total Bilirubin: 0.7 mg/dL (ref 0.3–1.2)
Total Protein: 7.8 g/dL (ref 6.5–8.1)

## 2019-06-28 LAB — CBC
HCT: 50.8 % — ABNORMAL HIGH (ref 36.0–46.0)
Hemoglobin: 16.2 g/dL — ABNORMAL HIGH (ref 12.0–15.0)
MCH: 29 pg (ref 26.0–34.0)
MCHC: 31.9 g/dL (ref 30.0–36.0)
MCV: 90.9 fL (ref 80.0–100.0)
Platelets: 236 10*3/uL (ref 150–400)
RBC: 5.59 MIL/uL — ABNORMAL HIGH (ref 3.87–5.11)
RDW: 14.3 % (ref 11.5–15.5)
WBC: 9.8 10*3/uL (ref 4.0–10.5)
nRBC: 0 % (ref 0.0–0.2)

## 2019-06-28 LAB — SALICYLATE LEVEL: Salicylate Lvl: <7 mg/dL (ref 2.8–30.0)

## 2019-06-28 LAB — RAPID URINE DRUG SCREEN, HOSP PERFORMED
Amphetamines: NOT DETECTED
Barbiturates: NOT DETECTED
Benzodiazepines: NOT DETECTED
Cocaine: POSITIVE — AB
Opiates: NOT DETECTED
Tetrahydrocannabinol: POSITIVE — AB

## 2019-06-28 LAB — ETHANOL: Alcohol, Ethyl (B): <10 mg/dL (ref <10)

## 2019-06-28 LAB — ACETAMINOPHEN LEVEL: Acetaminophen (Tylenol), Serum: <10 ug/mL — ABNORMAL LOW (ref 10–30)

## 2019-06-28 MED ORDER — POTASSIUM CHLORIDE CRYS ER 20 MEQ PO TBCR
40.0000 meq | EXTENDED_RELEASE_TABLET | Freq: Once | ORAL | Status: AC
  Administered 2019-06-29: 40 meq via ORAL
  Filled 2019-06-28: qty 2

## 2019-06-28 NOTE — Progress Notes (Deleted)
BH MD/PA/NP OP Progress Note  06/28/2019 10:25 AM Nickayla Mcinnis  MRN:  [id number]  Chief Complaint:  HPI: *** Visit Diagnosis: No diagnosis found.  Past Psychiatric History: Please see initial evaluation for full details. I have reviewed the history. No updates at this time.     Past Medical History:  Past Medical History:  Diagnosis Date  . Alcohol use   . Allergy   . Anxiety   . Asthma   . Childhood schizophrenia   . Chronic abdominal pain   . Collagen vascular disease ([place])   . COPD (chronic obstructive pulmonary disease) ([place])     Past Surgical History:  Procedure Laterality Date  . ABDOMINAL HYSTERECTOMY    . APPENDECTOMY      Family Psychiatric History: Please see initial evaluation for full details. I have reviewed the history. No updates at this time.     Family History:  Family History  Problem Relation Age of Onset  . Asthma Other   . COPD Other   . Alcoholism Other   . Heart attack Other   . Depression Other   . Mental illness Other   . Alcohol abuse Mother   . Colon cancer Mother 84  . Alcohol abuse Father   . Depression Father     Social History:  Social History   Socioeconomic History  . Marital status: Widowed    Spouse name: Not on file  . Number of children: 5  . Years of education: Not on file  . Highest education level: Some college, no degree  Occupational History  . Not on file  Social Needs  . Financial resource strain: Not hard at all  . Food insecurity    Worry: Sometimes true    Inability: Never true  . Transportation needs    Medical: No    Non-medical: No  Tobacco Use  . Smoking status: Current Every Day Smoker    Packs/day: 0.25    Types: Cigarettes  . Smokeless tobacco: Never Used  Substance and Sexual Activity  . Alcohol use: No  . Drug use: Yes    Types: Marijuana  . Sexual activity: Never  Lifestyle  . Physical activity    Days per week: Not on file    Minutes per session: Not on file  . Stress: Very  much  Relationships  . Social Herbalist on phone: Not on file    Gets together: Not on file    Attends religious service: Not on file    Active member of club or organization: Not on file    Attends meetings of clubs or organizations: More than 4 times per year    Relationship status: Widowed  Other Topics Concern  . Not on file  Social History Narrative  . Not on file    Allergies:  Allergies  Allergen Reactions  . Tramadol Nausea And Vomiting    Metabolic Disorder Labs: No results found for: HGBA1C, MPG No results found for: PROLACTIN No results found for: CHOL, TRIG, HDL, CHOLHDL, VLDL, LDLCALC No results found for: TSH  Therapeutic Level Labs: No results found for: LITHIUM No results found for: VALPROATE No components found for:  CBMZ  Current Medications: Current Outpatient Medications  Medication Sig Dispense Refill  . ARIPiprazole (ABILIFY) 2 MG tablet Take 1 tablet (2 mg total) by mouth daily. 30 tablet 0  . budesonide-formoterol (SYMBICORT) 160-4.5 MCG/ACT inhaler Inhale 2 puffs into the lungs 2 (two) times daily.    Marland Kitchen  ipratropium-albuterol (DUONEB) 0.5-2.5 (3) MG/3ML SOLN Inhale 3 mLs into the lungs every 6 (six) hours as needed (Shortness of Breath).   2  . LORazepam (ATIVAN) 1 MG tablet 1-1.5 mg daily as needed for anxiety 45 tablet 1  . polyethylene glycol-electrolytes (TRILYTE) 420 g solution Take 4,000 mLs by mouth as directed. 4000 mL 0  . tiZANidine (ZANAFLEX) 4 MG capsule Take 4 mg by mouth once.    . traZODone (DESYREL) 50 MG tablet 25-50 mg at night as needed for sleep 90 tablet 0  . venlafaxine XR (EFFEXOR-XR) 150 MG 24 hr capsule Take 1 capsule (150 mg total) by mouth daily. 90 capsule 0  . VENTOLIN HFA 108 (90 BASE) MCG/ACT inhaler Inhale 1 puff into the lungs every 6 (six) hours as needed for shortness of breath.   2   No current facility-administered medications for this visit.      Musculoskeletal: Strength & Muscle Tone:  N/A Gait & Station: N/A Patient leans: N/A  Psychiatric Specialty Exam: ROS  There were no vitals taken for this visit.There is no height or weight on file to calculate BMI.  General Appearance: {Appearance:22683}  Eye Contact:  {BHH EYE CONTACT:22684}  Speech:  Clear and Coherent  Volume:  Normal  Mood:  {BHH MOOD:22306}  Affect:  {Affect (PAA):22687}  Thought Process:  Coherent  Orientation:  Full (Time, Place, and Person)  Thought Content: Logical   Suicidal Thoughts:  {ST/HT (PAA):22692}  Homicidal Thoughts:  {ST/HT (PAA):22692}  Memory:  Immediate;   Good  Judgement:  {Judgement (PAA):22694}  Insight:  {Insight (PAA):22695}  Psychomotor Activity:  Normal  Concentration:  Concentration: Good and Attention Span: Good  Recall:  Good  Fund of Knowledge: Good  Language: Good  Akathisia:  No  Handed:  Right  AIMS (if indicated): not done  Assets:  Communication Skills Desire for Improvement  ADL's:  Intact  Cognition: WNL  Sleep:  {BHH GOOD/FAIR/POOR:22877}   Screenings:   Assessment and Plan:    Assessment and Plan:  Jasmine Werner is a 63 y.o. year old female with a history of depression,  alcohol use disorder in sustained remission,COPD,type II diabetes , who presents for follow up appointment for No diagnosis found.  # MDD, moderate, recurrent with psychotic features #r/o marijuana induced mood disorder Exam is notable for less mood lability, and she reports improvement in hallucinations since starting Abilify.  Noted that patient has been using marijuana every day for nausea; she is advised to discuss with her provider for treatment of nausea given when I can close worsening in her mood symptoms.  Will continue venlafaxine at the current dose to target depression.  Will continue Abilify at the current dose as adjunctive treatment for depression and see whether it exerts its full effect.  Will continue trazodone as needed for insomnia.  Will continue Lorazepam as  needed for anxiety.  Discussed risk of fall and oversedation.  Noted that she was reportedly told by her therapy clinic that she would not be able to see a therapist anymore; will make referral inside of our clinic now that they are available for new intake.   # Marijuana use She is at pre-contemplative stage for pot use.  Provided psychoeducation of its potential adverse effect on her mood.  Will continue motivational interview.   Plan  1.ContinueEffexor150 mg daily (she self tapered) 2. Continue Abilify 2 mg daily 2.ContinueTrazodone 25- 50 mg at night as needed for sleep  3.Continue lorazepamup to 1.5mg  daily as needed  for anxiety 4. Next appointment: 8/4 at 9:30 for 30 mins, video -  Referral to therapy (patient was reportedly told that she cannot see her therapist anymore) Emergency resources which includes 911, ED, suicide crisis line [phone]) are discussed.   The patient demonstrates the following risk factors for suicide: Chronic risk factors for suicide include:psychiatric disorder ofdepression. Acute risk factorsfor suicide include: unemployment. Protective factorsfor this patient include: responsibility to others (children, family), coping skills and hope for the future. Considering these factors, the overall suicide risk at this point appears to bemoderate, but not at imminent risk to self. Patientisappropriate for outpatient follow up. She denies gun access at home.  [name], MD 06/28/2019, 10:25 AM

## 2019-06-28 NOTE — ED Notes (Signed)
Pt ambulatory to restroom with no distress noted at this time.

## 2019-06-28 NOTE — ED Notes (Signed)
[name], daughter [phone], please update status of placement, along with other information, permission given by patient.

## 2019-06-28 NOTE — ED Provider Notes (Signed)
[facility] EMERGENCY DEPARTMENT Provider Note   CSN: [id number] Arrival date & time: 06/28/19  2058    History   Chief Complaint Chief Complaint  Patient presents with  . V70.1    HPI Jasmine Werner is a 63 y.o. female.     HPI  This is a 63 year old female with a history of schizophrenia and behavioral health issues, alcohol and drug abuse, COPD who presents with suicidal ideation.  Patient reports "I just cannot think of any other way out."  She reports several day history of being consumed by thoughts of wanting to hurt herself.  She does not have a plan.  She does report prior suicide attempts when she was in her 66s.  She is very vague and states that she has having problems with self-care.  She denies actually trying to kill herself today.  She does report drug use but "I do not know exactly what I take."  She denies any recent alcohol use.  Past Medical History:  Diagnosis Date  . Alcohol use   . Allergy   . Anxiety   . Asthma   . Childhood schizophrenia   . Chronic abdominal pain   . Collagen vascular disease ([place])   . COPD (chronic obstructive pulmonary disease) [facility])     Patient Active Problem List   Diagnosis Date Noted  . Unspecified abdominal pain 05/26/2019  . Rectal bleeding 05/26/2019  . Loss of weight 05/26/2019  . MDD (major depressive disorder), recurrent episode, moderate ([place]) 07/31/2018  . Alcohol use disorder, moderate, in sustained remission ([place]) 07/31/2018  . Primary osteoarthritis of right knee 10/08/2014    Past Surgical History:  Procedure Laterality Date  . ABDOMINAL HYSTERECTOMY    . APPENDECTOMY       OB History   No obstetric history on file.      Home Medications    Prior to Admission medications   Medication Sig Start Date End Date Taking? Authorizing Provider  ARIPiprazole (ABILIFY) 2 MG tablet Take 1 tablet (2 mg total) by mouth daily. 06/18/19   [name], MD  budesonide-formoterol (SYMBICORT) 160-4.5 MCG/ACT  inhaler Inhale 2 puffs into the lungs 2 (two) times daily.    [provider]  ipratropium-albuterol (DUONEB) 0.5-2.5 (3) MG/3ML SOLN Inhale 3 mLs into the lungs every 6 (six) hours as needed (Shortness of Breath).  07/21/15   [provider]  LORazepam (ATIVAN) 1 MG tablet 1-1.5 mg daily as needed for anxiety 06/18/19   [name], MD  polyethylene glycol-electrolytes (TRILYTE) 420 g solution Take 4,000 mLs by mouth as directed. 05/26/19   Rourk, Cristopher Estimable, MD  tiZANidine (ZANAFLEX) 4 MG capsule Take 4 mg by mouth once.    [provider]  traZODone (DESYREL) 50 MG tablet 25-50 mg at night as needed for sleep 06/25/19   [name], MD  venlafaxine XR (EFFEXOR-XR) 150 MG 24 hr capsule Take 1 capsule (150 mg total) by mouth daily. 06/25/19   [name], MD  VENTOLIN HFA 108 (90 BASE) MCG/ACT inhaler Inhale 1 puff into the lungs every 6 (six) hours as needed for shortness of breath.  07/22/15   [provider]    Family History Family History  Problem Relation Age of Onset  . Asthma Other   . COPD Other   . Alcoholism Other   . Heart attack Other   . Depression Other   . Mental illness Other   . Alcohol abuse Mother   . Colon cancer Mother 33  .  Alcohol abuse Father   . Depression Father     Social History Social History   Tobacco Use  . Smoking status: Current Every Day Smoker    Packs/day: 0.25    Types: Cigarettes  . Smokeless tobacco: Never Used  Substance Use Topics  . Alcohol use: No  . Drug use: Yes    Types: Marijuana     Allergies   Tramadol   Review of Systems Review of Systems  Constitutional: Negative for fever.  Respiratory: Negative for shortness of breath.   Cardiovascular: Negative for chest pain.  Gastrointestinal: Positive for nausea. Negative for abdominal pain and vomiting.  Musculoskeletal: Positive for back pain.  Psychiatric/Behavioral: Positive for dysphoric mood, sleep disturbance and suicidal ideas.  All  other systems reviewed and are negative.    Physical Exam Updated Vital Signs BP (!) 136/93 (BP Location: Left Arm)   Pulse 98   Temp 98.2 F (36.8 C) (Oral)   Resp 18   Ht 1.651 m (5\' 5" )   Wt 77.1 kg   SpO2 98%   BMI 28.29 kg/m   Physical Exam Vitals signs and nursing note reviewed.  Constitutional:      Appearance: She is well-developed. She is obese. She is not ill-appearing or toxic-appearing.  HENT:     Head: Normocephalic and atraumatic.  Neck:     Musculoskeletal: Neck supple.  Cardiovascular:     Rate and Rhythm: Normal rate and regular rhythm.     Heart sounds: Normal heart sounds.  Pulmonary:     Effort: Pulmonary effort is normal. No respiratory distress.     Breath sounds: No wheezing.  Abdominal:     General: Bowel sounds are normal.     Palpations: Abdomen is soft.     Tenderness: There is no abdominal tenderness.     Hernia: No hernia is present.  Musculoskeletal:     Right lower leg: No edema.     Left lower leg: No edema.  Skin:    General: Skin is warm and dry.  Neurological:     Mental Status: She is alert and oriented to person, place, and time.  Psychiatric:     Comments: Flat affect      ED Treatments / Results  Labs (all labs ordered are listed, but only abnormal results are displayed) Labs Reviewed  COMPREHENSIVE METABOLIC PANEL - Abnormal; Notable for the following components:      Result Value   Potassium 2.9 (*)    All other components within normal limits  ACETAMINOPHEN LEVEL - Abnormal; Notable for the following components:   Acetaminophen (Tylenol), Serum <10 (*)    All other components within normal limits  CBC - Abnormal; Notable for the following components:   RBC 5.59 (*)    Hemoglobin 16.2 (*)    HCT 50.8 (*)    All other components within normal limits  RAPID URINE DRUG SCREEN, HOSP PERFORMED - Abnormal; Notable for the following components:   Cocaine POSITIVE (*)    Tetrahydrocannabinol POSITIVE (*)    All other  components within normal limits  ETHANOL  SALICYLATE LEVEL    EKG None  Radiology No results found.  Procedures Procedures (including critical care time)  Medications Ordered in ED Medications - No data to display   Initial Impression / Assessment and Plan / ED Course  I have reviewed the triage vital signs and the nursing notes.  Pertinent labs & imaging results that were available during my care of the patient  were reviewed by me and considered in my medical decision making (see chart for details).        Patient presents with suicidal ideation.  Behavioral health history.  Work-up only notable for hypokalemia.  This was replaced.  TTS consulted and recommending inpatient placement.  Final Clinical Impressions(s) / ED Diagnoses   Final diagnoses:  Suicidal ideation  Hypokalemia    ED Discharge Orders    None       Merryl Hacker, MD 06/28/19 2322
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
## 2019-06-28 NOTE — ED Notes (Signed)
TTS in progress 

## 2019-06-28 NOTE — ED Triage Notes (Signed)
Pt from home via [facility]. Per Ems pt suicidal. Police found crack pipe on pt. Also told police she wanted to end her life.

## 2019-06-29 ENCOUNTER — Ambulatory Visit: Payer: Medicare Other | Admitting: General Surgery

## 2019-06-29 ENCOUNTER — Encounter (HOSPITAL_COMMUNITY): Payer: Self-pay | Admitting: *Deleted

## 2019-06-29 ENCOUNTER — Encounter (HOSPITAL_COMMUNITY): Payer: Self-pay

## 2019-06-29 ENCOUNTER — Inpatient Hospital Stay (HOSPITAL_COMMUNITY)
Admission: AD | Admit: 2019-06-29 | Discharge: 2019-07-07 | DRG: 885 | Disposition: A | Discharge status: Home / Self Care | Payer: Medicare Other | Source: Intra-hospital | Attending: Psychiatry | Admitting: Psychiatry

## 2019-06-29 DIAGNOSIS — F17210 Nicotine dependence, cigarettes, uncomplicated: Secondary | ICD-10-CM | POA: Diagnosis present

## 2019-06-29 DIAGNOSIS — J449 Chronic obstructive pulmonary disease, unspecified: Secondary | ICD-10-CM | POA: Diagnosis present

## 2019-06-29 DIAGNOSIS — F329 Major depressive disorder, single episode, unspecified: Secondary | ICD-10-CM | POA: Diagnosis not present

## 2019-06-29 DIAGNOSIS — Z811 Family history of alcohol abuse and dependence: Secondary | ICD-10-CM | POA: Diagnosis not present

## 2019-06-29 DIAGNOSIS — K760 Fatty (change of) liver, not elsewhere classified: Secondary | ICD-10-CM | POA: Diagnosis present

## 2019-06-29 DIAGNOSIS — F1420 Cocaine dependence, uncomplicated: Secondary | ICD-10-CM | POA: Diagnosis present

## 2019-06-29 DIAGNOSIS — Z915 Personal history of self-harm: Secondary | ICD-10-CM | POA: Diagnosis not present

## 2019-06-29 DIAGNOSIS — F1220 Cannabis dependence, uncomplicated: Secondary | ICD-10-CM | POA: Diagnosis present

## 2019-06-29 DIAGNOSIS — Z825 Family history of asthma and other chronic lower respiratory diseases: Secondary | ICD-10-CM | POA: Diagnosis not present

## 2019-06-29 DIAGNOSIS — E119 Type 2 diabetes mellitus without complications: Secondary | ICD-10-CM | POA: Diagnosis present

## 2019-06-29 DIAGNOSIS — Z885 Allergy status to narcotic agent status: Secondary | ICD-10-CM | POA: Diagnosis not present

## 2019-06-29 DIAGNOSIS — R058 Other specified cough: Secondary | ICD-10-CM

## 2019-06-29 DIAGNOSIS — F411 Generalized anxiety disorder: Secondary | ICD-10-CM | POA: Diagnosis present

## 2019-06-29 DIAGNOSIS — F333 Major depressive disorder, recurrent, severe with psychotic symptoms: Secondary | ICD-10-CM | POA: Diagnosis not present

## 2019-06-29 DIAGNOSIS — F3160 Bipolar disorder, current episode mixed, unspecified: Secondary | ICD-10-CM | POA: Diagnosis present

## 2019-06-29 DIAGNOSIS — F1494 Cocaine use, unspecified with cocaine-induced mood disorder: Secondary | ICD-10-CM | POA: Diagnosis not present

## 2019-06-29 DIAGNOSIS — R45851 Suicidal ideations: Secondary | ICD-10-CM | POA: Diagnosis present

## 2019-06-29 DIAGNOSIS — Z800 Family history of malignant neoplasm of digestive organs: Secondary | ICD-10-CM

## 2019-06-29 DIAGNOSIS — R05 Cough: Secondary | ICD-10-CM

## 2019-06-29 DIAGNOSIS — F12288 Cannabis dependence with other cannabis-induced disorder: Secondary | ICD-10-CM | POA: Diagnosis not present

## 2019-06-29 DIAGNOSIS — Z818 Family history of other mental and behavioral disorders: Secondary | ICD-10-CM | POA: Diagnosis not present

## 2019-06-29 DIAGNOSIS — F332 Major depressive disorder, recurrent severe without psychotic features: Secondary | ICD-10-CM | POA: Diagnosis not present

## 2019-06-29 LAB — SARS CORONAVIRUS 2 BY RT PCR (HOSPITAL ORDER, PERFORMED IN ~~LOC~~ HOSPITAL LAB): SARS Coronavirus 2: NEGATIVE

## 2019-06-29 MED ORDER — IPRATROPIUM-ALBUTEROL 0.5-2.5 (3) MG/3ML IN SOLN: 3.0000 mL | Freq: Four times a day (QID) | RESPIRATORY_TRACT | Status: DC | PRN

## 2019-06-29 MED ORDER — ARIPIPRAZOLE 2 MG PO TABS
2.0000 mg | ORAL_TABLET | Freq: Every day | ORAL | Status: DC
  Administered 2019-06-29: 2 mg via ORAL
  Filled 2019-06-29 (×2): qty 1

## 2019-06-29 MED ORDER — TRAZODONE HCL 50 MG PO TABS
50.0000 mg | ORAL_TABLET | Freq: Every day | ORAL | Status: DC
  Administered 2019-06-30: 50 mg via ORAL
  Filled 2019-06-29 (×3): qty 1

## 2019-06-29 MED ORDER — ALBUTEROL SULFATE HFA 108 (90 BASE) MCG/ACT IN AERS: 1.0000 | INHALATION_SPRAY | Freq: Four times a day (QID) | RESPIRATORY_TRACT | Status: DC | PRN

## 2019-06-29 MED ORDER — ARIPIPRAZOLE 2 MG PO TABS
2.0000 mg | ORAL_TABLET | Freq: Every day | ORAL | Status: DC
  Filled 2019-06-29 (×2): qty 1

## 2019-06-29 MED ORDER — ALBUTEROL SULFATE HFA 108 (90 BASE) MCG/ACT IN AERS: 2.0000 | INHALATION_SPRAY | Freq: Once | RESPIRATORY_TRACT | Status: DC

## 2019-06-29 MED ORDER — TIZANIDINE HCL 4 MG PO TABS
4.0000 mg | ORAL_TABLET | Freq: Every day | ORAL | Status: DC | PRN
  Filled 2019-06-29: qty 1

## 2019-06-29 MED ORDER — MOMETASONE FURO-FORMOTEROL FUM 200-5 MCG/ACT IN AERO
2.0000 | INHALATION_SPRAY | Freq: Two times a day (BID) | RESPIRATORY_TRACT | Status: DC
  Administered 2019-06-29: 2 via RESPIRATORY_TRACT
  Filled 2019-06-29 (×4): qty 8.8

## 2019-06-29 MED ORDER — ALBUTEROL SULFATE HFA 108 (90 BASE) MCG/ACT IN AERS
2.0000 | INHALATION_SPRAY | RESPIRATORY_TRACT | Status: DC | PRN
  Administered 2019-06-30 – 2019-07-01 (×2): 2 via RESPIRATORY_TRACT
  Filled 2019-06-29: qty 6.7

## 2019-06-29 MED ORDER — MOMETASONE FURO-FORMOTEROL FUM 200-5 MCG/ACT IN AERO
2.0000 | INHALATION_SPRAY | Freq: Two times a day (BID) | RESPIRATORY_TRACT | Status: DC
  Administered 2019-06-30 – 2019-07-07 (×15): 2 via RESPIRATORY_TRACT
  Filled 2019-06-29 (×2): qty 8.8

## 2019-06-29 MED ORDER — VENLAFAXINE HCL ER 150 MG PO CP24
150.0000 mg | ORAL_CAPSULE | Freq: Every day | ORAL | Status: DC
  Filled 2019-06-29 (×2): qty 1

## 2019-06-29 MED ORDER — ACETAMINOPHEN 325 MG PO TABS
650.0000 mg | ORAL_TABLET | Freq: Four times a day (QID) | ORAL | Status: DC | PRN
  Administered 2019-06-30 – 2019-07-07 (×14): 650 mg via ORAL
  Filled 2019-06-29 (×14): qty 2

## 2019-06-29 MED ORDER — VENLAFAXINE HCL ER 37.5 MG PO CP24
150.0000 mg | ORAL_CAPSULE | Freq: Every day | ORAL | Status: DC
  Administered 2019-06-29: 150 mg via ORAL
  Filled 2019-06-29 (×2): qty 4

## 2019-06-29 MED ORDER — ALUM & MAG HYDROXIDE-SIMETH 200-200-20 MG/5ML PO SUSP: 30.0000 mL | ORAL | Status: DC | PRN

## 2019-06-29 MED ORDER — ALBUTEROL SULFATE HFA 108 (90 BASE) MCG/ACT IN AERS
2.0000 | INHALATION_SPRAY | RESPIRATORY_TRACT | Status: DC | PRN
  Administered 2019-06-29: 2 via RESPIRATORY_TRACT
  Filled 2019-06-29: qty 6.7

## 2019-06-29 MED ORDER — TRAZODONE HCL 50 MG PO TABS: 50.0000 mg | ORAL_TABLET | Freq: Every day | ORAL | Status: DC

## 2019-06-29 NOTE — ED Notes (Signed)
Pt given snack and drink 

## 2019-06-29 NOTE — ED Notes (Signed)
Patient stated "I know I need help and the only way to get it is to tell y'all I am suicidal. I wouldn't use a knife but if I could get into a car then [place]"

## 2019-06-29 NOTE — ED Notes (Signed)
Patient is resting comfortably. 

## 2019-06-29 NOTE — ED Notes (Signed)
Pt came to nurse's station stating she was not given medications. Advised pt that I watched Heather, RN give pt her scheduled medications. Pt then stated she wanted [facility] to fill her prescriptions by her PCP. Advised pt we do not perform that we do not fill PCP prescriptions here.
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
## 2019-06-29 NOTE — Tx Team (Signed)
Initial Treatment Plan 06/29/2019 6:57 PM [name] THY:[id number]    PATIENT STRESSORS: Financial difficulties Health problems Medication change or noncompliance Substance abuse   PATIENT STRENGTHS: Capable of independent living Communication skills   PATIENT IDENTIFIED PROBLEMS: Suicidal Ideation  Substance Abuse  Depression                 DISCHARGE CRITERIA:  Ability to meet basic life and health needs Adequate post-discharge living arrangements  PRELIMINARY DISCHARGE PLAN: Attend aftercare/continuing care group Outpatient therapy Return to previous living arrangement  PATIENT/FAMILY INVOLVEMENT: This treatment plan has been presented to and reviewed with the patient, Jasmine Werner, and/or family member.  The patient and family have been given the opportunity to ask questions and make suggestions.  Vela Prose, RN 06/29/2019, 6:57 PM

## 2019-06-29 NOTE — ED Notes (Signed)
[facility] transport called to transport pt to [facility].

## 2019-06-29 NOTE — Progress Notes (Signed)
Pt accepted to [place], Bed 302-2   Mordecai Maes, NP is the accepting provider.  Myles Lipps, MD. is the attending provider.  Call report to  [phone]  Urban Gibson, RN @ [facility] ED notified.   Pt is Voluntary.  Pt may be transported by [place] COVID-19 TEST Pt scheduled  to arrive at [facility] when COVID-19 is resulted as Negative  Romie Minus T. Judi Cong, MSW, [facility] Disposition Clinical Social Work [phone] (cell) [phone] (office)

## 2019-06-30 DIAGNOSIS — F333 Major depressive disorder, recurrent, severe with psychotic symptoms: Secondary | ICD-10-CM

## 2019-06-30 LAB — LIPID PANEL
Cholesterol: 191 mg/dL (ref 0–200)
HDL: 32 mg/dL — ABNORMAL LOW (ref >40)
LDL Cholesterol: 133 mg/dL — ABNORMAL HIGH (ref 0–99)
Total CHOL/HDL Ratio: 6 RATIO
Triglycerides: 132 mg/dL (ref <150)
VLDL: 26 mg/dL (ref 0–40)

## 2019-06-30 LAB — HEMOGLOBIN A1C
Hgb A1c MFr Bld: 6.2 % — ABNORMAL HIGH (ref 4.8–5.6)
Mean Plasma Glucose: 131.24 mg/dL

## 2019-06-30 LAB — TSH: TSH: 6.057 u[IU]/mL — ABNORMAL HIGH (ref 0.350–4.500)

## 2019-06-30 LAB — PREGNANCY, URINE: Preg Test, Ur: NEGATIVE

## 2019-06-30 MED ORDER — HYDROXYZINE HCL 25 MG PO TABS
25.0000 mg | ORAL_TABLET | Freq: Four times a day (QID) | ORAL | Status: DC | PRN
  Administered 2019-06-30 – 2019-07-04 (×11): 25 mg via ORAL
  Filled 2019-06-30 (×11): qty 1

## 2019-06-30 MED ORDER — POTASSIUM CHLORIDE CRYS ER 20 MEQ PO TBCR
20.0000 meq | EXTENDED_RELEASE_TABLET | Freq: Once | ORAL | Status: AC
  Administered 2019-06-30: 20 meq via ORAL
  Filled 2019-06-30: qty 1

## 2019-06-30 MED ORDER — POLYETHYLENE GLYCOL 3350 17 G PO PACK
17.0000 g | PACK | Freq: Every day | ORAL | Status: DC
  Administered 2019-06-30 – 2019-07-03 (×4): 17 g via ORAL
  Filled 2019-06-30 (×10): qty 1

## 2019-06-30 MED ORDER — ARIPIPRAZOLE 5 MG PO TABS
5.0000 mg | ORAL_TABLET | Freq: Every day | ORAL | Status: DC
  Administered 2019-06-30 – 2019-07-03 (×4): 5 mg via ORAL
  Filled 2019-06-30 (×6): qty 1

## 2019-06-30 MED ORDER — VENLAFAXINE HCL ER 75 MG PO CP24
225.0000 mg | ORAL_CAPSULE | Freq: Every day | ORAL | Status: DC
  Administered 2019-06-30 – 2019-07-07 (×8): 225 mg via ORAL
  Filled 2019-06-30 (×11): qty 3

## 2019-06-30 MED ORDER — HYDROXYZINE HCL 25 MG PO TABS
ORAL_TABLET | ORAL | Status: AC
  Filled 2019-06-30: qty 1

## 2019-06-30 MED ORDER — ALBUTEROL SULFATE HFA 108 (90 BASE) MCG/ACT IN AERS
1.0000 | INHALATION_SPRAY | RESPIRATORY_TRACT | Status: DC | PRN
  Administered 2019-06-30 – 2019-07-07 (×12): 2 via RESPIRATORY_TRACT

## 2019-06-30 MED ORDER — FLUTICASONE PROPIONATE HFA 44 MCG/ACT IN AERO
2.0000 | INHALATION_SPRAY | Freq: Two times a day (BID) | RESPIRATORY_TRACT | Status: DC
  Administered 2019-06-30 – 2019-07-07 (×14): 2 via RESPIRATORY_TRACT
  Filled 2019-06-30 (×2): qty 10.6

## 2019-06-30 MED ORDER — CLONIDINE HCL 0.1 MG PO TABS
0.1000 mg | ORAL_TABLET | Freq: Three times a day (TID) | ORAL | Status: DC | PRN
  Administered 2019-07-03 – 2019-07-04 (×2): 0.1 mg via ORAL
  Filled 2019-06-30: qty 1

## 2019-06-30 MED ORDER — BUSPIRONE HCL 5 MG PO TABS
5.0000 mg | ORAL_TABLET | Freq: Three times a day (TID) | ORAL | Status: DC
  Filled 2019-06-30 (×7): qty 1

## 2019-06-30 NOTE — Progress Notes (Signed)
D: Pt was at nurse's station upon initial approach.  Pt presents with anxious affect and mood.  Her goal today was to "eat and take a nap and I accomplished that."  Pt denies SI/HI, denies hallucinations, reports R leg pain of 5/10.  Pt has been visible in milieu interacting with peers and staff appropriately.    A: Introduced self to pt.  Met with pt 1:1.  Actively listened to pt and offered support and encouragement.  Medications administered per order.  PRN medication administered for shortness of breath, anxiety, and pain.  15 minute safety checks in place.  R: Pt is safe on the unit.  Pt is compliant with medications.  Pt verbally contracts for safety.  Will continue to monitor and assess.

## 2019-06-30 NOTE — BHH Suicide Risk Assessment (Signed)
[facility] Admission Suicide Risk Assessment   Nursing information obtained from:  Patient Demographic factors:  NA Current Mental Status:  Suicidal ideation indicated by patient Loss Factors:  NA Historical Factors:    Risk Reduction Factors:     Total Time spent with patient: 30 minutes Principal Problem: <principal problem not specified> Diagnosis:  Active Problems:   MDD (major depressive disorder)  Subjective Data: Patient is seen and examined.  Patient is a 63 year old female with a past psychiatric history significant for anxiety, depression and recent relapse on cocaine or heroin who presented to the [facility] emergency department on 06/28/2019 with suicidal ideation.  In the emergency room notes the patient stated she had plan to overdose on drugs or jump into the river and drown.  At that time she stated "I do not see any way out, I am just trying to get to heaven".  She is disorganized this morning, and significantly anxious.  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  She had recently relapsed approximately 2 months ago.  She has been followed psychiatrically at [facility] in [place], but missed her last 2 appointments.  She did admit to using marijuana on basically a daily basis.  Her last visit per psychiatry was on 05/27/2019.  It was done by tele-visit.  The note reflected that the patient had recently started taking Abilify prior to this visit.  She was very somatic and was concerned about the possibility of pancreatic cancer.  It was noted she was significantly anxious, but reported that she was sleeping 12 hours a day.  Her list of medications at that time included Abilify 2 mg p.o. daily, lorazepam 1 to 1.5 mg p.o. daily as needed anxiety, Zanaflex 4 mg as needed, trazodone 50 mg p.o. nightly and venlafaxine extended release 150 mg p.o. daily.  Her compliance with medication was not clear.  Her CT scan of the  abdomen did not show any evidence of cancer, but did show fatty liver.  The patient also had a HIDA scan done which showed normal ejection fraction of the gallbladder.  She was having nausea and there was no explanation for that.  She recently had a chest x-ray also on 05/14/2019 that showed no active cardiopulmonary disease.  She is very anxious and not a great historian at this time.  She was admitted to the hospital for evaluation and stabilization.  Continued Clinical Symptoms:  Alcohol Use Disorder Identification Test Final Score (AUDIT): 0 The "Alcohol Use Disorders Identification Test", Guidelines for Use in Primary Care, Second Edition.  World Pharmacologist [facility]). Score between 0-7:  no or low risk or alcohol related problems. Score between 8-15:  moderate risk of alcohol related problems. Score between 16-19:  high risk of alcohol related problems. Score 20 or above:  warrants further diagnostic evaluation for alcohol dependence and treatment.   CLINICAL FACTORS:   Severe Anxiety and/or Agitation Depression:   Anhedonia Comorbid alcohol abuse/dependence Hopelessness Impulsivity Insomnia Alcohol/Substance Abuse/Dependencies   Musculoskeletal: Strength & Muscle Tone: within normal limits Gait & Station: normal Patient leans: N/A  Psychiatric Specialty Exam: Physical Exam  Nursing note and vitals reviewed. Constitutional: She is oriented to person, place, and time. She appears well-developed and well-nourished.  HENT:  Head: Normocephalic and atraumatic.  Respiratory: Effort normal.  Neurological: She is alert and oriented to person, place, and time.    ROS  Blood pressure (!) 152/114, pulse Marland Kitchen)  110, temperature 98.5 F (36.9 C), temperature source Oral, resp. rate 20, SpO2 98 %.There is no height or weight on file to calculate BMI.  General Appearance: Disheveled  Eye Contact:  Fair  Speech:  Pressured  Volume:  Increased  Mood:  Anxious, Depressed and Dysphoric   Affect:  Congruent  Thought Process:  Disorganized and Descriptions of Associations: Circumstantial  Orientation:  Full (Time, Place, and Person)  Thought Content:  Logical  Suicidal Thoughts:  Yes.  without intent/plan  Homicidal Thoughts:  No  Memory:  Immediate;   Poor Recent;   Poor Remote;   Poor  Judgement:  Impaired  Insight:  Lacking  Psychomotor Activity:  Increased  Concentration:  Concentration: Poor and Attention Span: Poor  Recall:  Poor  Fund of Knowledge:  Poor  Language:  Fair  Akathisia:  Negative  Handed:  Right  AIMS (if indicated):     Assets:  Desire for Improvement Resilience  ADL's:  Impaired  Cognition:  WNL  Sleep:         COGNITIVE FEATURES THAT CONTRIBUTE TO RISK:  None    SUICIDE RISK:   Mild:  Suicidal ideation of limited frequency, intensity, duration, and specificity.  There are no identifiable plans, no associated intent, mild dysphoria and related symptoms, good self-control (both objective and subjective assessment), few other risk factors, and identifiable protective factors, including available and accessible social support.  PLAN OF CARE: Patient is seen and examined.  Patient is a 63 year old female with the above-stated past psychiatric history who was admitted secondary to agitation, depression, anxiety and suicidal ideation.  She is fairly disorganized right now, and it is difficult to say whether it secondary to anxiety or her substance issues.  I will go on and increase her Abilify to 5 mg p.o. daily, and we will also increase her Effexor extended release to 225 mg p.o. daily.  She will be admitted to the hospital.  She will be integrated into the milieu.  She will be encouraged to attend groups.  Review of her laboratories revealed a low potassium at 2.9, and that will be supplemented.  Additionally her hemoglobin and hematocrit are mildly elevated at 16.2 and 50.8.  This may be secondary to her COPD.  Her TSH is mildly elevated at  6.057, and a T4 will be ordered.  Drug screen was positive for cocaine as well as marijuana.  Currently she is tachycardic and her blood pressure is elevated at 152/114.  Given the cocaine beta-blockers would be contraindicated.  We do not have an EKG currently, but we will get one.  Once I have reviewed that I will prescribe clonidine 0.1 mg p.o. 3 times daily for diastolic blood pressure greater then 100.  I certify that inpatient services furnished can reasonably be expected to improve the patient's condition.   Sharma Covert, MD 06/30/2019, 10:00 AM

## 2019-06-30 NOTE — Plan of Care (Signed)
D: Pt alert and oriented on the unit. Pt denies SI/HI, A/VH. Pt's affect was flat with depressed mood today. Pt is cooperative on the unit. A: Education, support and encouragement provided, q15 minute safety checks remain in effect. Medications administered per MD orders. R: No reactions/side effects to medicine noted. Pt denies any concerns at this time, and verbally contracts for safety. Pt ambulating on the unit with no issues. Pt remains safe on and off the unit.   Problem: Self-Concept: Goal: Ability to disclose and discuss suicidal ideas will improve Outcome: Progressing   Problem: Coping: Goal: Will verbalize feelings Outcome: Progressing
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
## 2019-06-30 NOTE — Progress Notes (Signed)
Recreation Therapy Notes  Date:  7.29.20 Time: 0930 Location: 300 Hall Dayroom  Group Topic: Stress Management  Goal Area(s) Addresses:  Patient will identify positive stress management techniques. Patient will identify benefits of using stress management post d/c.  Intervention: Stress Management  Activity :  Guided Imagery.  LRT introduced the stress management technique of guided imagery.  LRT read a script that took patients on a journey to the beach.  Patients were to listen and follow along as script was read to engage in activity.  Education:  Stress Management, Discharge Planning.   Education Outcome: Acknowledges Education  Clinical Observations/Feedback:  Pt did not attend group.     Victorino Sparrow, LRT/CTRS         Ria Comment, Damiana Berrian A 06/30/2019 10:46 AM
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
## 2019-06-30 NOTE — H&P (Signed)
Psychiatric Admission Assessment Adult  Patient Identification: Jasmine Werner MRN:  [id number] Date of Evaluation:  06/30/2019 Chief Complaint:  MDD Principal Diagnosis: <principal problem not specified> Diagnosis:  Active Problems:   MDD (major depressive disorder)  History of Present Illness: tient is seen and examined.  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  In the emergency room notes the patient stated she had plan to overdose on drugs or jump into the river and drown.  At that time she stated "I do not see any way out, I am just trying to get to heaven".  She is disorganized this morning, and significantly anxious.  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  She had recently relapsed approximately 2 months ago.  She has been followed psychiatrically at [facility] in [place], but missed her last 2 appointments.  She did admit to using marijuana on basically a daily basis.  Her last visit per psychiatry was on 05/27/2019.  It was done by tele-visit.  The note reflected that the patient had recently started taking Abilify prior to this visit.  She was very somatic and was concerned about the possibility of pancreatic cancer.  It was noted she was significantly anxious, but reported that she was sleeping 12 hours a day.  Her list of medications at that time included Abilify 2 mg p.o. daily, lorazepam 1 to 1.5 mg p.o. daily as needed anxiety, Zanaflex 4 mg as needed, trazodone 50 mg p.o. nightly and venlafaxine extended release 150 mg p.o. daily.  Her compliance with medication was not clear.  Her CT scan of the abdomen did not show any evidence of cancer, but did show fatty liver.  The patient also had a HIDA scan done which showed normal  ejection fraction of the gallbladder.  She was having nausea and there was no explanation for that.  She recently had a chest x-ray also on 05/14/2019 that showed no active cardiopulmonary disease.  She is very anxious and not a great historian at this time.  She was admitted to the hospital for evaluation and stabilization.  Associated Signs/Symptoms: Depression Symptoms:  depressed mood, anhedonia, insomnia, psychomotor agitation, fatigue, feelings of worthlessness/guilt, difficulty concentrating, hopelessness, impaired memory, suicidal thoughts without plan, anxiety, loss of energy/fatigue, disturbed sleep, (Hypo) Manic Symptoms:  Hallucinations, Impulsivity, Labiality of Mood, Anxiety Symptoms:  Excessive Worry, Psychotic Symptoms:  Hallucinations: Auditory PTSD Symptoms: Had a traumatic exposure:  In the past Total Time spent with patient: 45 minutes  Past Psychiatric History: She has been followed by an outpatient psychiatrist ( Dr Modesta Messing) most recently.  She has been diagnosed with major depression in the past.  She also has been diagnosed with posttraumatic stress disorder.  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  Her relapse was approximately a month and a half ago.  It appears that she has been on the venlafaxine extended release, trazodone, Klonopin or Ativan for at least a year.  Is the patient at risk to self? Yes.    Has the patient been a risk to self in the past 6 months? No.  Has the patient been a risk to self within the distant past? Yes.    Is the patient a risk to others? No.  Has the patient  been a risk to others in the past 6 months? No.  Has the patient been a risk to others within the distant past? No.   Prior Inpatient Therapy:   Prior Outpatient Therapy:    Alcohol Screening: 1. How often do you have a drink containing alcohol?: Never 2. How many drinks containing alcohol do you have on a typical day when you are drinking?: 1 or 2 3.  How often do you have six or more drinks on one occasion?: Never AUDIT-C Score: 0 4. How often during the last year have you found that you were not able to stop drinking once you had started?: Never 5. How often during the last year have you failed to do what was normally expected from you becasue of drinking?: Never 6. How often during the last year have you needed a first drink in the morning to get yourself going after a heavy drinking session?: Never 7. How often during the last year have you had a feeling of guilt of remorse after drinking?: Never 8. How often during the last year have you been unable to remember what happened the night before because you had been drinking?: Never 9. Have you or someone else been injured as a result of your drinking?: No 10. Has a relative or friend or a doctor or another health worker been concerned about your drinking or suggested you cut down?: No Alcohol Use Disorder Identification Test Final Score (AUDIT): 0 Substance Abuse History in the last 12 months:  Yes.   Consequences of Substance Abuse: Medical Consequences:  Exacerbated her psychiatric condition leading to the necessity of admission. Previous Psychotropic Medications: Yes  Psychological Evaluations: Yes  Past Medical History:  Past Medical History:  Diagnosis Date  . Alcohol use   . Allergy   . Anxiety   . Asthma   . Childhood schizophrenia   . Chronic abdominal pain   . Collagen vascular disease ([place])   . COPD (chronic obstructive pulmonary disease) ([place])     Past Surgical History:  Procedure Laterality Date  . ABDOMINAL HYSTERECTOMY    . APPENDECTOMY     Family History:  Family History  Problem Relation Age of Onset  . Asthma Other   . COPD Other   . Alcoholism Other   . Heart attack Other   . Depression Other   . Mental illness Other   . Alcohol abuse Mother   . Colon cancer Mother 7  . Alcohol abuse Father   . Depression Father    Family Psychiatric  History:  Noncontributory Tobacco Screening:   Social History:  Social History   Substance and Sexual Activity  Alcohol Use No     Social History   Substance and Sexual Activity  Drug Use Yes  . Types: Marijuana, Cocaine    Additional Social History: Marital status: Married What types of issues is patient dealing with in the relationship?: Husband is in federal prison out in [place]. Additional relationship information: Hasn't seen him since 1990 Are you sexually active?: No What is your sexual orientation?: Straight Has your sexual activity been affected by drugs, alcohol, medication, or emotional stress?: Has been married 3 times. Does patient have children?: Yes How many children?: 5 How is patient's relationship with their children?: 5 adult children: [phone]. Reports she is working on the relationships.                         Allergies:   Allergies  Allergen Reactions  . Tramadol Nausea And Vomiting   Lab Results:  Results for orders placed or performed during the hospital encounter of 06/29/19 (from the past 48 hour(s))  Pregnancy, urine     Status: None   Collection Time: 06/29/19  6:54 PM  Result Value Ref Range   Preg Test, Ur NEGATIVE NEGATIVE    Comment:        THE SENSITIVITY OF THIS METHODOLOGY IS >20 mIU/mL. Performed at [facility], [place] [address] [place], [place] [address]   Hemoglobin A1c     Status: Abnormal   Collection Time: 06/30/19  6:31 AM  Result Value Ref Range   Hgb A1c MFr Bld 6.2 (H) 4.8 - 5.6 %    Comment: (NOTE) Pre diabetes:          5.7%-6.4% Diabetes:              >6.4% Glycemic control for   <7.0% adults with diabetes    Mean Plasma Glucose 131.24 mg/dL    Comment: Performed at [facility] [address], [place], [place] [address]  Lipid panel     Status: Abnormal   Collection Time: 06/30/19  6:31 AM  Result Value Ref Range   Cholesterol 191 0 - 200 mg/dL   Triglycerides 132 <150 mg/dL    HDL 32 (L) >40 mg/dL   Total CHOL/HDL Ratio 6.0 RATIO   VLDL 26 0 - 40 mg/dL   LDL Cholesterol 133 (H) 0 - 99 mg/dL    Comment:        Total Cholesterol/HDL:CHD Risk Coronary Heart Disease Risk Table                     Men   Women  1/2 Average Risk   3.4   3.3  Average Risk       5.0   4.4  2 X Average Risk   9.6   7.1  3 X Average Risk  23.4   11.0        Use the calculated Patient Ratio above and the CHD Risk Table to determine the patient's CHD Risk.        ATP III CLASSIFICATION (LDL):  <100     mg/dL   Optimal  100-129  mg/dL   Near or Above                    Optimal  130-159  mg/dL   Borderline  160-189  mg/dL   High  >190     mg/dL   Very High Performed at [facility] [address], [place], [place] [address]   TSH     Status: Abnormal   Collection Time: 06/30/19  6:31 AM  Result Value Ref Range   TSH 6.057 (H) 0.350 - 4.500 uIU/mL    Comment: Performed by a 3rd Generation assay with a functional sensitivity of <=0.01 uIU/mL. Performed at [facility], [place] [address], [place], [place] [address]     Blood Alcohol level:  Lab Results  Component Value Date   ETH <10 06/28/2019   ETH <10 78/67/5449    Metabolic Disorder Labs:  Lab Results  Component Value Date   HGBA1C 6.2 (H) 06/30/2019   MPG 131.24 06/30/2019   No results found for: PROLACTIN Lab Results  Component Value Date   CHOL 191 06/30/2019   TRIG 132 06/30/2019   HDL 32 (L) 06/30/2019  CHOLHDL 6.0 06/30/2019   VLDL 26 06/30/2019   LDLCALC 133 (H) 06/30/2019    Current Medications: Current Facility-Administered Medications  Medication Dose Route Frequency Provider Last Rate Last Dose  . acetaminophen (TYLENOL) tablet 650 mg  650 mg Oral Q6H PRN Mordecai Maes, NP      . albuterol (VENTOLIN HFA) 108 (90 Base) MCG/ACT inhaler 1-2 puff  1-2 puff Inhalation Q4H PRN Sharma Covert, MD      . albuterol (VENTOLIN HFA) 108 (90 Base) MCG/ACT  inhaler 2 puff  2 puff Inhalation Q4H PRN Mordecai Maes, NP   2 puff at 06/30/19 0316  . alum & mag hydroxide-simeth (MAALOX/MYLANTA) 200-200-20 MG/5ML suspension 30 mL  30 mL Oral Q4H PRN Mordecai Maes, NP      . ARIPiprazole (ABILIFY) tablet 5 mg  5 mg Oral Daily Sharma Covert, MD   5 mg at 06/30/19 1610  . busPIRone (BUSPAR) tablet 5 mg  5 mg Oral TID Sharma Covert, MD      . cloNIDine (CATAPRES) tablet 0.1 mg  0.1 mg Oral TID PRN Sharma Covert, MD      . fluticasone (FLOVENT HFA) 44 MCG/ACT inhaler 2 puff  2 puff Inhalation BID Sharma Covert, MD   2 puff at 06/30/19 0911  . hydrOXYzine (ATARAX/VISTARIL) tablet 25 mg  25 mg Oral Q6H PRN Lindon Romp A, NP   25 mg at 06/30/19 0910  . ipratropium-albuterol (DUONEB) 0.5-2.5 (3) MG/3ML nebulizer solution 3 mL  3 mL Inhalation Q6H PRN Mordecai Maes, NP      . mometasone-formoterol [facility]) 200-5 MCG/ACT inhaler 2 puff  2 puff Inhalation BID Mordecai Maes, NP   2 puff at 06/30/19 0911  . polyethylene glycol (MIRALAX / GLYCOLAX) packet 17 g  17 g Oral Daily Sharma Covert, MD   17 g at 06/30/19 0959  . potassium chloride SA (K-DUR) CR tablet 20 mEq  20 mEq Oral Once Sharma Covert, MD      . traZODone (DESYREL) tablet 50 mg  50 mg Oral QHS Mordecai Maes, NP      . venlafaxine XR (EFFEXOR-XR) 24 hr capsule 225 mg  225 mg Oral Daily Sharma Covert, MD   225 mg at 06/30/19 9604   PTA Medications: Medications Prior to Admission  Medication Sig Dispense Refill Last Dose  . ARIPiprazole (ABILIFY) 2 MG tablet Take 1 tablet (2 mg total) by mouth daily. 30 tablet 0   . budesonide-formoterol (SYMBICORT) 160-4.5 MCG/ACT inhaler Inhale 2 puffs into the lungs 2 (two) times daily.     Marland Kitchen ipratropium-albuterol (DUONEB) 0.5-2.5 (3) MG/3ML SOLN Inhale 3 mLs into the lungs every 6 (six) hours as needed (Shortness of Breath).   2   . LORazepam (ATIVAN) 1 MG tablet 1-1.5 mg daily as needed for anxiety 45 tablet 1   .  polyethylene glycol-electrolytes (TRILYTE) 420 g solution Take 4,000 mLs by mouth as directed. (Patient not taking: Reported on 06/29/2019) 4000 mL 0   . tiZANidine (ZANAFLEX) 4 MG capsule Take 4 mg by mouth daily as needed.      . traZODone (DESYREL) 50 MG tablet 25-50 mg at night as needed for sleep 90 tablet 0   . venlafaxine XR (EFFEXOR-XR) 150 MG 24 hr capsule Take 1 capsule (150 mg total) by mouth daily. 90 capsule 0   . VENTOLIN HFA 108 (90 BASE) MCG/ACT inhaler Inhale 1 puff into the lungs every 6 (six) hours as needed for shortness of breath.  2     Musculoskeletal: Strength & Muscle Tone: within normal limits Gait & Station: normal Patient leans: N/A  Psychiatric Specialty Exam: Physical Exam  Nursing note and vitals reviewed. Constitutional: She is oriented to person, place, and time. She appears well-developed and well-nourished.  HENT:  Head: Normocephalic and atraumatic.  Respiratory: Effort normal.  Neurological: She is alert and oriented to person, place, and time.    ROS  Blood pressure (!) 152/114, pulse (!) 110, temperature 98.5 F (36.9 C), temperature source Oral, resp. rate 20, SpO2 98 %.There is no height or weight on file to calculate BMI.  General Appearance: Disheveled  Eye Contact:  Fair  Speech:  Pressured  Volume:  Increased  Mood:  Anxious, Depressed, Dysphoric and Irritable  Affect:  Congruent  Thought Process:  Goal Directed and Descriptions of Associations: Circumstantial  Orientation:  Full (Time, Place, and Person)  Thought Content:  Hallucinations: Auditory  Suicidal Thoughts:  Yes.  without intent/plan  Homicidal Thoughts:  No  Memory:  Immediate;   Poor Recent;   Poor Remote;   Poor  Judgement:  Impaired  Insight:  Fair  Psychomotor Activity:  Increased  Concentration:  Concentration: Fair and Attention Span: Fair  Recall:  [employer] of Knowledge:  Fair  Language:  Fair  Akathisia:  Negative  Handed:  Right  AIMS (if indicated):      Assets:  Desire for Improvement Resilience  ADL's:  Intact  Cognition:  Impaired,  Mild  Sleep:       Treatment Plan Summary: Daily contact with patient to assess and evaluate symptoms and progress in treatment, Medication management and Plan : Patient is seen and examined.  Patient is a 63 year old female with the above-stated past psychiatric history who was admitted secondary to agitation, depression, anxiety and suicidal ideation.  She is fairly disorganized right now, and it is difficult to say whether it secondary to anxiety or her substance issues.  I will go on and increase her Abilify to 5 mg p.o. daily, and we will also increase her Effexor extended release to 225 mg p.o. daily.  She will be admitted to the hospital.  She will be integrated into the milieu.  She will be encouraged to attend groups.  Review of her laboratories revealed a low potassium at 2.9, and that will be supplemented.  Additionally her hemoglobin and hematocrit are mildly elevated at 16.2 and 50.8.  This may be secondary to her COPD.  Her TSH is mildly elevated at 6.057, and a T4 will be ordered.  Drug screen was positive for cocaine as well as marijuana.  Currently she is tachycardic and her blood pressure is elevated at 152/114.  Given the cocaine beta-blockers would be contraindicated.  We do not have an EKG currently, but we will get one.  Once I have reviewed that I will prescribe clonidine 0.1 mg p.o. 3 times daily for diastolic blood pressure greater then 100.  Observation Level/Precautions:  Detox 15 minute checks  Laboratory:  Chemistry Profile  Psychotherapy:    Medications:    Consultations:    Discharge Concerns:    Estimated LOS:  Other:     Physician Treatment Plan for Primary Diagnosis: <principal problem not specified> Long Term Goal(s): Improvement in symptoms so as ready for discharge  Short Term Goals: Ability to identify changes in lifestyle to reduce recurrence of condition will improve,  Ability to verbalize feelings will improve, Ability to disclose and discuss suicidal ideas, Ability to demonstrate  self-control will improve, Ability to identify and develop effective coping behaviors will improve, Ability to maintain clinical measurements within normal limits will improve, Compliance with prescribed medications will improve and Ability to identify triggers associated with substance abuse/mental health issues will improve  Physician Treatment Plan for Secondary Diagnosis: Active Problems:   MDD (major depressive disorder)  Long Term Goal(s): Improvement in symptoms so as ready for discharge  Short Term Goals: Ability to identify changes in lifestyle to reduce recurrence of condition will improve, Ability to verbalize feelings will improve, Ability to disclose and discuss suicidal ideas, Ability to demonstrate self-control will improve, Ability to identify and develop effective coping behaviors will improve, Ability to maintain clinical measurements within normal limits will improve, Compliance with prescribed medications will improve and Ability to identify triggers associated with substance abuse/mental health issues will improve  I certify that inpatient services furnished can reasonably be expected to improve the patient's condition.    Sharma Covert, MD 7/29/202012:48 PM

## 2019-06-30 NOTE — Progress Notes (Signed)
Patient ID: Jasmine Werner, female   DOB: 03/09/1956, 63 y.o.   MRN: [id number]  [place] NOVEL CORONAVIRUS (COVID-19) DAILY CHECK-OFF SYMPTOMS - answer yes or no to each - every day NO YES  Have you had a fever in the past 24 hours?  . Fever (Temp > 37.80C / 100F) X   Have you had any of these symptoms in the past 24 hours? . New Cough .  Sore Throat  .  Shortness of Breath .  Difficulty Breathing .  Unexplained Body Aches   X   Have you had any one of these symptoms in the past 24 hours not related to allergies?   . Runny Nose .  Nasal Congestion .  Sneezing   X   If you have had runny nose, nasal congestion, sneezing in the past 24 hours, has it worsened?  X   EXPOSURES - check yes or no X   Have you traveled outside the state in the past 14 days?  X   Have you been in contact with someone with a confirmed diagnosis of COVID-19 or PUI in the past 14 days without wearing appropriate PPE?  X   Have you been living in the same home as a person with confirmed diagnosis of COVID-19 or a PUI (household contact)?    X   Have you been diagnosed with COVID-19?    X              What to do next: Answered NO to all: Answered YES to anything:   Proceed with unit schedule Follow the [facility] Inpatient Flowsheet.

## 2019-06-30 NOTE — Tx Team (Signed)
Interdisciplinary Treatment and Diagnostic Plan Update  06/30/2019 Time of Session: 9:00am Jasmine Werner MRN: [id number]  Principal Diagnosis: <principal problem not specified>  Secondary Diagnoses: Active Problems:   MDD (major depressive disorder)   Current Medications:  Current Facility-Administered Medications  Medication Dose Route Frequency Provider Last Rate Last Dose  . acetaminophen (TYLENOL) tablet 650 mg  650 mg Oral Q6H PRN Mordecai Maes, NP      . albuterol (VENTOLIN HFA) 108 (90 Base) MCG/ACT inhaler 1-2 puff  1-2 puff Inhalation Q4H PRN Sharma Covert, MD      . albuterol (VENTOLIN HFA) 108 (90 Base) MCG/ACT inhaler 2 puff  2 puff Inhalation Q4H PRN Mordecai Maes, NP   2 puff at 06/30/19 0316  . alum & mag hydroxide-simeth (MAALOX/MYLANTA) 200-200-20 MG/5ML suspension 30 mL  30 mL Oral Q4H PRN Mordecai Maes, NP      . ARIPiprazole (ABILIFY) tablet 5 mg  5 mg Oral Daily Sharma Covert, MD      . busPIRone (BUSPAR) tablet 5 mg  5 mg Oral TID Sharma Covert, MD      . fluticasone (FLOVENT HFA) 44 MCG/ACT inhaler 2 puff  2 puff Inhalation BID Sharma Covert, MD      . hydrOXYzine (ATARAX/VISTARIL) tablet 25 mg  25 mg Oral Q6H PRN Lindon Romp A, NP   25 mg at 06/30/19 0316  . ipratropium-albuterol (DUONEB) 0.5-2.5 (3) MG/3ML nebulizer solution 3 mL  3 mL Inhalation Q6H PRN Mordecai Maes, NP      . mometasone-formoterol (DULERA) 200-5 MCG/ACT inhaler 2 puff  2 puff Inhalation BID Mordecai Maes, NP      . polyethylene glycol (MIRALAX / GLYCOLAX) packet 17 g  17 g Oral Daily Sharma Covert, MD      . traZODone (DESYREL) tablet 50 mg  50 mg Oral QHS Mordecai Maes, NP      . venlafaxine XR (EFFEXOR-XR) 24 hr capsule 225 mg  225 mg Oral Daily Sharma Covert, MD       PTA Medications: Medications Prior to Admission  Medication Sig Dispense Refill Last Dose  . ARIPiprazole (ABILIFY) 2 MG tablet Take 1 tablet (2 mg total) by mouth daily. 30  tablet 0   . budesonide-formoterol (SYMBICORT) 160-4.5 MCG/ACT inhaler Inhale 2 puffs into the lungs 2 (two) times daily.     Marland Kitchen ipratropium-albuterol (DUONEB) 0.5-2.5 (3) MG/3ML SOLN Inhale 3 mLs into the lungs every 6 (six) hours as needed (Shortness of Breath).   2   . LORazepam (ATIVAN) 1 MG tablet 1-1.5 mg daily as needed for anxiety 45 tablet 1   . polyethylene glycol-electrolytes (TRILYTE) 420 g solution Take 4,000 mLs by mouth as directed. (Patient not taking: Reported on 06/29/2019) 4000 mL 0   . tiZANidine (ZANAFLEX) 4 MG capsule Take 4 mg by mouth daily as needed.      . traZODone (DESYREL) 50 MG tablet 25-50 mg at night as needed for sleep 90 tablet 0   . venlafaxine XR (EFFEXOR-XR) 150 MG 24 hr capsule Take 1 capsule (150 mg total) by mouth daily. 90 capsule 0   . VENTOLIN HFA 108 (90 BASE) MCG/ACT inhaler Inhale 1 puff into the lungs every 6 (six) hours as needed for shortness of breath.   2     Patient Stressors: Financial difficulties Health problems Medication change or noncompliance Substance abuse  Patient Strengths: Capable of independent living Communication skills  Treatment Modalities: Medication Management, Group therapy, Case management,  1 to  1 session with clinician, Psychoeducation, Recreational therapy.   Physician Treatment Plan for Primary Diagnosis: <principal problem not specified> Long Term Goal(s):     Short Term Goals:    Medication Management: Evaluate patient's response, side effects, and tolerance of medication regimen.  Therapeutic Interventions: 1 to 1 sessions, Unit Group sessions and Medication administration.  Evaluation of Outcomes: Progressing  Physician Treatment Plan for Secondary Diagnosis: Active Problems:   MDD (major depressive disorder)  Long Term Goal(s):     Short Term Goals:       Medication Management: Evaluate patient's response, side effects, and tolerance of medication regimen.  Therapeutic Interventions: 1 to 1  sessions, Unit Group sessions and Medication administration.  Evaluation of Outcomes: Progressing   RN Treatment Plan for Primary Diagnosis: <principal problem not specified> Long Term Goal(s): Knowledge of disease and therapeutic regimen to maintain health will improve  Short Term Goals: Ability to remain free from injury will improve, Ability to verbalize frustration and anger appropriately will improve, Ability to identify and develop effective coping behaviors will improve and Compliance with prescribed medications will improve  Medication Management: RN will administer medications as ordered by provider, will assess and evaluate patient's response and provide education to patient for prescribed medication. RN will report any adverse and/or side effects to prescribing provider.  Therapeutic Interventions: 1 on 1 counseling sessions, Psychoeducation, Medication administration, Evaluate responses to treatment, Monitor vital signs and CBGs as ordered, Perform/monitor CIWA, COWS, AIMS and Fall Risk screenings as ordered, Perform wound care treatments as ordered.  Evaluation of Outcomes: Progressing   LCSW Treatment Plan for Primary Diagnosis: <principal problem not specified> Long Term Goal(s): Safe transition to appropriate next level of care at discharge, Engage patient in therapeutic group addressing interpersonal concerns.  Short Term Goals: Engage patient in aftercare planning with referrals and resources, Increase emotional regulation, Identify triggers associated with mental health/substance abuse issues and Increase skills for wellness and recovery  Therapeutic Interventions: Assess for all discharge needs, 1 to 1 time with Social worker, Explore available resources and support systems, Assess for adequacy in community support network, Educate family and significant other(s) on suicide prevention, Complete Psychosocial Assessment, Interpersonal group therapy.  Evaluation of Outcomes:  Progressing   Progress in Treatment: Attending groups: No. New to unit. Participating in groups: No. Taking medication as prescribed: Yes. Toleration medication: Yes. Family/Significant other contact made: No, will contact:  daughter, Deneise Lever Patient understands diagnosis: Yes. Discussing patient identified problems/goals with staff: Yes. Medical problems stabilized or resolved: Yes. Denies suicidal/homicidal ideation: No. Issues/concerns per patient self-inventory: Yes.   New problem(s) identified: Yes, Describe:  home safety concerns, hx of suicide attempts and self harm  New Short Term/Long Term Goal(s): medication management for mood stabilization; elimination of SI thoughts; development of comprehensive mental wellness/sobriety plan.  Patient Goals:  "Become less self destructive."  Discharge Plan or Barriers: Patient is interested in IOP through [facility] if there are digital options. Otherwise, she has an outpatient psychiatrist and would like to be referred to a therapist.  Reason for Continuation of Hospitalization: Anxiety Depression Suicidal ideation  Estimated Length of Stay: 5-7 days  Attendees: Patient: Jasmine Werner 06/30/2019 8:43 AM  Physician: Queen Blossom 06/30/2019 8:43 AM  Nursing: Elberta Fortis RN 06/30/2019 8:43 AM  RN Care Manager: 06/30/2019 8:43 AM  Social Worker: Stephanie Acre, [place] 06/30/2019 8:43 AM  Recreational Therapist:  06/30/2019 8:43 AM  Other:  06/30/2019 8:43 AM  Other:  06/30/2019 8:43 AM  Other: 06/30/2019 8:43 AM    Scribe  for Treatment Team: [name], Latanya Presser 06/30/2019 8:43 AM

## 2019-06-30 NOTE — Plan of Care (Signed)
D: Denies SI, HI, AVH, and verbally contracts for safety.    A: Support provided. Patient educated on safety on the unit and medications. Routine safety checks every 15 minutes. Patient stated understanding to tell nurse about any new physical symptoms. Patient understands to tell staff of any needs.     R: No adverse drug reactions noted. Patient verbally contracts for safety. Patient remains safe at this time and will continue to monitor.   Problem: Education: Goal: Knowledge of [facility] Education information/materials will improve Outcome: Progressing   [place] NOVEL CORONAVIRUS (COVID-19) DAILY CHECK-OFF SYMPTOMS - answer yes or no to each - every day NO YES  Have you had a fever in the past 24 hours?  Fever (Temp > 37.80C / 100F) X   Have you had any of these symptoms in the past 24 hours? New Cough  Sore Throat   Shortness of Breath  Difficulty Breathing  Unexplained Body Aches   X   Have you had any one of these symptoms in the past 24 hours not related to allergies?   Runny Nose  Nasal Congestion  Sneezing   X   If you have had runny nose, nasal congestion, sneezing in the past 24 hours, has it worsened?  X   EXPOSURES - check yes or no X   Have you traveled outside the state in the past 14 days?  X   Have you been in contact with someone with a confirmed diagnosis of COVID-19 or PUI in the past 14 days without wearing appropriate PPE?  X   Have you been living in the same home as a person with confirmed diagnosis of COVID-19 or a PUI (household contact)?    X   Have you been diagnosed with COVID-19?    X              What to do next: Answered NO to all: Answered YES to anything:   Proceed with unit schedule Follow the [facility] Inpatient Flowsheet.

## 2019-07-01 LAB — BASIC METABOLIC PANEL
Anion gap: 10 (ref 5–15)
BUN: 14 mg/dL (ref 6–20)
CO2: 24 mmol/L (ref 22–32)
Calcium: 9.1 mg/dL (ref 8.9–10.3)
Chloride: 107 mmol/L (ref 98–111)
Creatinine, Ser: 0.63 mg/dL (ref 0.44–1.00)
GFR calc Af Amer: >60 mL/min (ref >60)
GFR calc non Af Amer: >60 mL/min (ref >60)
Glucose, Bld: 106 mg/dL — ABNORMAL HIGH (ref 70–99)
Potassium: 3.8 mmol/L (ref 3.5–5.1)
Sodium: 141 mmol/L (ref 135–145)

## 2019-07-01 LAB — GLUCOSE, CAPILLARY
Glucose-Capillary: 107 mg/dL — ABNORMAL HIGH (ref 70–99)
Glucose-Capillary: 129 mg/dL — ABNORMAL HIGH (ref 70–99)
Glucose-Capillary: 91 mg/dL (ref 70–99)

## 2019-07-01 LAB — T3 UPTAKE: T3 Uptake Ratio: 23 % — ABNORMAL LOW (ref 24–39)

## 2019-07-01 MED ORDER — LISINOPRIL 5 MG PO TABS
5.0000 mg | ORAL_TABLET | Freq: Every day | ORAL | Status: DC
  Administered 2019-07-01 – 2019-07-02 (×2): 5 mg via ORAL
  Filled 2019-07-01 (×5): qty 1

## 2019-07-01 MED ORDER — BUSPIRONE HCL 10 MG PO TABS
10.0000 mg | ORAL_TABLET | Freq: Three times a day (TID) | ORAL | Status: DC
  Filled 2019-07-01 (×6): qty 1

## 2019-07-01 MED ORDER — TRAZODONE HCL 100 MG PO TABS
100.0000 mg | ORAL_TABLET | Freq: Every day | ORAL | Status: DC
  Administered 2019-07-01: 100 mg via ORAL
  Filled 2019-07-01 (×3): qty 1

## 2019-07-01 NOTE — Progress Notes (Signed)
[facility] MD Progress Note  07/01/2019 7:54 AM Jasmine Werner  MRN:  [id number] Subjective: Patient is a 63 year old female with a past psychiatric history significant for major depression, generalized anxiety, and recent relapse on cocaine and perhaps heroin.  She was admitted on 06/30/2019 secondary to suicidal ideation.  Objective: Patient is seen and examined.  Patient is a 63 year old female with the above-stated past psychiatric history is seen in follow-up.  She is doing better today.  She is less pressured, less agitated, and less disorganized.  That may be secondary to her medications as well as the cocaine clearing her system.  She denied any suicidal ideation today.  Review of the electronic medical record revealed that her daughter wants her to go to a long-term substance abuse treatment program, but she is not interested in that at this point.  She stated she had maintained sobriety for 20 years, and had a relapse of approximately 6 weeks, and would like to be able to do treatment as an outpatient at this time.  She stated that her anxiety today is still an issue, and that may be because of the lack of Ativan.  She understands why I have stopped that, given the substance abuse issues.  Her blood pressure today remains elevated at 128/115.  She is afebrile and her pulse is 92.  She only slept 5 hours last night.  Principal Problem: <principal problem not specified> Diagnosis: Active Problems:   MDD (major depressive disorder)  Total Time spent with patient: 20 minutes  Past Psychiatric History: See admission H&P  Past Medical History:  Past Medical History:  Diagnosis Date  . Alcohol use   . Allergy   . Anxiety   . Asthma   . Childhood schizophrenia   . Chronic abdominal pain   . Collagen vascular disease ([place])   . COPD (chronic obstructive pulmonary disease) ([place])     Past Surgical History:  Procedure Laterality Date  . ABDOMINAL HYSTERECTOMY    . APPENDECTOMY     Family  History:  Family History  Problem Relation Age of Onset  . Asthma Other   . COPD Other   . Alcoholism Other   . Heart attack Other   . Depression Other   . Mental illness Other   . Alcohol abuse Mother   . Colon cancer Mother 87  . Alcohol abuse Father   . Depression Father    Family Psychiatric  History: See admission H&P Social History:  Social History   Substance and Sexual Activity  Alcohol Use No     Social History   Substance and Sexual Activity  Drug Use Yes  . Types: Marijuana, Cocaine    Social History   Socioeconomic History  . Marital status: Widowed    Spouse name: Not on file  . Number of children: 5  . Years of education: Not on file  . Highest education level: Some college, no degree  Occupational History  . Not on file  Social Needs  . Financial resource strain: Not hard at all  . Food insecurity    Worry: Sometimes true    Inability: Never true  . Transportation needs    Medical: No    Non-medical: No  Tobacco Use  . Smoking status: Current Every Day Smoker    Packs/day: 0.25    Types: Cigarettes  . Smokeless tobacco: Never Used  Substance and Sexual Activity  . Alcohol use: No  . Drug use: Yes    Types: Marijuana, Cocaine  .  Sexual activity: Never  Lifestyle  . Physical activity    Days per week: Not on file    Minutes per session: Not on file  . Stress: Very much  Relationships  . Social Herbalist on phone: Not on file    Gets together: Not on file    Attends religious service: Not on file    Active member of club or organization: Not on file    Attends meetings of clubs or organizations: More than 4 times per year    Relationship status: Widowed  Other Topics Concern  . Not on file  Social History Narrative  . Not on file   Additional Social History:                         Sleep: Fair  Appetite:  Fair  Current Medications: Current Facility-Administered Medications  Medication Dose Route  Frequency Provider Last Rate Last Dose  . acetaminophen (TYLENOL) tablet 650 mg  650 mg Oral Q6H PRN Mordecai Maes, NP   650 mg at 07/01/19 0427  . albuterol (VENTOLIN HFA) 108 (90 Base) MCG/ACT inhaler 1-2 puff  1-2 puff Inhalation Q4H PRN Sharma Covert, MD   2 puff at 06/30/19 1931  . albuterol (VENTOLIN HFA) 108 (90 Base) MCG/ACT inhaler 2 puff  2 puff Inhalation Q4H PRN Mordecai Maes, NP   2 puff at 06/30/19 0316  . alum & mag hydroxide-simeth (MAALOX/MYLANTA) 200-200-20 MG/5ML suspension 30 mL  30 mL Oral Q4H PRN Mordecai Maes, NP      . ARIPiprazole (ABILIFY) tablet 5 mg  5 mg Oral Daily Sharma Covert, MD   5 mg at 06/30/19 2778  . busPIRone (BUSPAR) tablet 5 mg  5 mg Oral TID Sharma Covert, MD      . cloNIDine (CATAPRES) tablet 0.1 mg  0.1 mg Oral TID PRN Sharma Covert, MD      . fluticasone (FLOVENT HFA) 44 MCG/ACT inhaler 2 puff  2 puff Inhalation BID Sharma Covert, MD   2 puff at 06/30/19 2042  . hydrOXYzine (ATARAX/VISTARIL) tablet 25 mg  25 mg Oral Q6H PRN Lindon Romp A, NP   25 mg at 06/30/19 1931  . ipratropium-albuterol (DUONEB) 0.5-2.5 (3) MG/3ML nebulizer solution 3 mL  3 mL Inhalation Q6H PRN Mordecai Maes, NP      . mometasone-formoterol [facility]) 200-5 MCG/ACT inhaler 2 puff  2 puff Inhalation BID Mordecai Maes, NP   2 puff at 06/30/19 1932  . polyethylene glycol (MIRALAX / GLYCOLAX) packet 17 g  17 g Oral Daily Sharma Covert, MD   17 g at 06/30/19 0959  . traZODone (DESYREL) tablet 50 mg  50 mg Oral QHS Mordecai Maes, NP   50 mg at 06/30/19 2056  . venlafaxine XR (EFFEXOR-XR) 24 hr capsule 225 mg  225 mg Oral Daily Sharma Covert, MD   225 mg at 06/30/19 2423    Lab Results:  Results for orders placed or performed during the hospital encounter of 06/29/19 (from the past 48 hour(s))  Pregnancy, urine     Status: None   Collection Time: 06/29/19  6:54 PM  Result Value Ref Range   Preg Test, Ur NEGATIVE NEGATIVE    Comment:         THE SENSITIVITY OF THIS METHODOLOGY IS >20 mIU/mL. Performed at [facility], [place] [address], [place], [place] [address]   Hemoglobin A1c  Status: Abnormal   Collection Time: 06/30/19  6:31 AM  Result Value Ref Range   Hgb A1c MFr Bld 6.2 (H) 4.8 - 5.6 %    Comment: (NOTE) Pre diabetes:          5.7%-6.4% Diabetes:              >6.4% Glycemic control for   <7.0% adults with diabetes    Mean Plasma Glucose 131.24 mg/dL    Comment: Performed at [facility] [address], [place], [place] [address]  Lipid panel     Status: Abnormal   Collection Time: 06/30/19  6:31 AM  Result Value Ref Range   Cholesterol 191 0 - 200 mg/dL   Triglycerides 132 <150 mg/dL   HDL 32 (L) >40 mg/dL   Total CHOL/HDL Ratio 6.0 RATIO   VLDL 26 0 - 40 mg/dL   LDL Cholesterol 133 (H) 0 - 99 mg/dL    Comment:        Total Cholesterol/HDL:CHD Risk Coronary Heart Disease Risk Table                     Men   Women  1/2 Average Risk   3.4   3.3  Average Risk       5.0   4.4  2 X Average Risk   9.6   7.1  3 X Average Risk  23.4   11.0        Use the calculated Patient Ratio above and the CHD Risk Table to determine the patient's CHD Risk.        ATP III CLASSIFICATION (LDL):  <100     mg/dL   Optimal  100-129  mg/dL   Near or Above                    Optimal  130-159  mg/dL   Borderline  160-189  mg/dL   High  >190     mg/dL   Very High Performed at [facility] [address], [place], [place] [address]   TSH     Status: Abnormal   Collection Time: 06/30/19  6:31 AM  Result Value Ref Range   TSH 6.057 (H) 0.350 - 4.500 uIU/mL    Comment: Performed by a 3rd Generation assay with a functional sensitivity of <=0.01 uIU/mL. Performed at [facility], [place] [address], [place], [place] [address]   T3 uptake     Status: Abnormal   Collection Time: 06/30/19  6:31 AM  Result Value Ref Range   T3 Uptake Ratio 23 (L) 24 - 39 %     Comment: (NOTE) Performed At: [facility] [place], [place] [id number] Rush Farmer MD GS:[id number]     Blood Alcohol level:  Lab Results  Component Value Date   [facility] <10 06/28/2019   ETH <10 85/92/9244    Metabolic Disorder Labs: Lab Results  Component Value Date   HGBA1C 6.2 (H) 06/30/2019   MPG 131.24 06/30/2019   No results found for: PROLACTIN Lab Results  Component Value Date   CHOL 191 06/30/2019   TRIG 132 06/30/2019   HDL 32 (L) 06/30/2019   CHOLHDL 6.0 06/30/2019   VLDL 26 06/30/2019   LDLCALC 133 (H) 06/30/2019    Physical Findings: AIMS: Facial and Oral Movements Muscles of Facial Expression: None, normal Lips and Perioral Area: None, normal Jaw: None, normal Tongue: None, normal,Extremity Movements Upper (arms, wrists,  hands, fingers): None, normal Lower (legs, knees, ankles, toes): None, normal, Trunk Movements Neck, shoulders, hips: None, normal, Overall Severity Severity of abnormal movements (highest score from questions above): None, normal Incapacitation due to abnormal movements: None, normal Patient's awareness of abnormal movements (rate only patient's report): No Awareness, Dental Status Current problems with teeth and/or dentures?: Yes Does patient usually wear dentures?: Yes(dentures are home)  CIWA:    COWS:     Musculoskeletal: Strength & Muscle Tone: within normal limits Gait & Station: normal Patient leans: N/A  Psychiatric Specialty Exam: Physical Exam  Nursing note and vitals reviewed. Constitutional: She is oriented to person, place, and time. She appears well-developed and well-nourished.  HENT:  Head: Normocephalic and atraumatic.  Respiratory: Effort normal.  Neurological: She is alert and oriented to person, place, and time.    ROS  Blood pressure (!) 128/115, pulse 92, temperature 98.4 F (36.9 C), temperature source Oral, resp. rate 20, SpO2 98 %.There is no height or weight on file to  calculate BMI.  General Appearance: Disheveled  Eye Contact:  Fair  Speech:  Pressured  Volume:  Increased  Mood:  Anxious  Affect:  Congruent  Thought Process:  Coherent and Descriptions of Associations: Intact  Orientation:  Full (Time, Place, and Person)  Thought Content:  Logical  Suicidal Thoughts:  No  Homicidal Thoughts:  No  Memory:  Immediate;   Fair Recent;   Fair Remote;   Fair  Judgement:  Intact  Insight:  Fair  Psychomotor Activity:  Increased  Concentration:  Concentration: Fair and Attention Span: Fair  Recall:  [employer] of Knowledge:  Fair  Language:  Good  Akathisia:  Negative  Handed:  Right  AIMS (if indicated):     Assets:  Desire for Improvement Resilience  ADL's:  Intact  Cognition:  WNL  Sleep:  Number of Hours: 5     Treatment Plan Summary: Daily contact with patient to assess and evaluate symptoms and progress in treatment, Medication management and Plan : Patient is seen and examined.  Patient is a 63 year old female with the above-stated past psychiatric history who is seen in follow-up.   Diagnosis: #1 major depression, recurrent, severe, #2 cocaine dependence, #3 marijuana dependence, #4 generalized anxiety disorder, #5 COPD, #6 possible hypothyroidism, #7 hypokalemia, #8 diabetes mellitus type 2  Patient is is seen in follow-up.  She is doing better today but still rather anxious.  Her blood pressure remains elevated.  She received clonidine 0.1 mg p.o. 3 times daily as needed for diastolic blood pressure greater then 100.  Given her type 2 diabetes I am going to start an ACE inhibitor today for renal protection.  We will start lisinopril at 10 mg p.o. daily.  Additionally her hemoglobin A1c is 6.2.  We will get her blood sugars and see what the ranges in case we need to add metformin.  Her potassium was low yesterday, and she was supplemented.  I will repeat a metabolic panel in the a.m.  Her T3 is abnormal, and were waiting for the T4 to come  back to confirm hypothyroidism.  She feels like the Abilify was helpful as well as the increase in the Effexor.  I am going to increase her BuSpar to 10 mg p.o. 3 times daily today.  No other changes in her medications at this point. 1.  Continue albuterol HFA 1 to 2 puffs p.o. every 4 hours as needed shortness of breath for COPD. 2.  Continue Abilify 5  mg p.o. daily for mood stability as well as augmentation for depression treatment. 3.  Increase BuSpar to 10 mg p.o. 3 times daily for anxiety. 4.  Add lisinopril 10 mg p.o. daily for hypertension and renal protection. 5.  Continue Catapres 0.1 mg p.o. 3 times daily as needed diastolic blood pressure greater than 100. 6.  Continue Flovent HFA 2 puffs twice daily for COPD. 7.  Increase hydroxyzine to 50 mg p.o. every 6 hours as needed anxiety. 8.  DuoNeb inhalations every 6 hours as needed shortness of breath. 9.  Continue Dulera 2 puffs twice daily for COPD. 10.  Continue MiraLAX daily for constipation. 11.  Recheck potassium this today. 12.  Increase trazodone 200 mg p.o. nightly for sleep. 13.  Continue with Effexor extended release 225 mg p.o. daily for depression and anxiety. 14.  Check blood sugars 3 times daily before meals and at bedtime to determine need for medications for diabetes mellitus type 2. 15.  Await results of T4 to make a determination on necessity of treatment of possible hypothyroidism. 16.  Disposition planning-in progress.  Sharma Covert, MD 07/01/2019, 7:54 AM

## 2019-07-01 NOTE — Progress Notes (Signed)
CSW has spoken with both patient and patient's daughter regarding disposition.  Patient's daughter (and reportedly other children) feel very strongly that the patient should go on to additional inpatient substance use treatment. Daughter has been in contact with [facility] in [place] and wishes for the patient to discharge to [facility] from [facility]. Per daughter, [facility] not have an available bed until August 10th. CSW explained it is very unlikely the patient will remain inpatient for another 10 days. Daughter inquired what programs patient could go to between being discharged from [facility] and until she could enter [facility] explained that the patient could return home, stay with friends or family, or the patient could follow up with shelter resources. However, shelter bed availability is often limited and staying a shelter may be a barrier to patient entering residential treatment. Daughter voices understanding.  Daughter also states that [facility] intake staff told her that the patient's Trazadone would need to be "substituted" for her to be considered for admission.   CSW spoke with patient on unit. Patient is still uncertain of what follow up she wants to pursue. Patient states her plan is to complete a phone interview with [facility] (she has left two voicemails so far), and if [facility] feels that she is appropriate, she will go for residential treatment. If [facility] does not feel that she is appropriate, she wants to return home and follow up with [facility] for IOP or outpatient with her psychiatrist in [place].  CSW following for disposition.  Stephanie Acre, LCSW-A Clinical Social Worker

## 2019-07-01 NOTE — Progress Notes (Signed)
Patient admits to having been sober for 20 years and relapsing. She credited her good day to having had a good conversation with her peer. Her goal for tomorrow is to wash her hair.

## 2019-07-01 NOTE — Progress Notes (Signed)
D:  Patient's self inventory sheet, patient sleeps good, no sleep medication.  Good appetite, low energy level, poor concentration.  Rated depression, hopeless and anxiety 5.  Withdrawals, chilling, cramping, agitation.  Denied SI.  Has experienced blurred vision.  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  Pain decreased from 8 down to 4.  Medication helpful.   A:  Medications administered per MD orders; however, patient did refuse buspar, stated this med makes her head feel like she is receiving radio signals.   R:  Patient denied SI and HI, contracts for safety.  Denied A/V hallucinations.  Safety maintained with 15 minute checks.  Patient stated this morning.that she smokes a little THC before going to bed at night, makes her sleep better.  Also smokes THC before driving which makes her see better.

## 2019-07-01 NOTE — Plan of Care (Signed)
Nurse discussed coping skills, anxiety, depression with patient.  

## 2019-07-01 NOTE — [facility] Group Notes (Signed)
LCSW Group Therapy Note  07/01/2019 2:39 PM  Type of Therapy/Topic: Group Therapy: Feelings about Diagnosis  Participation Level: Active   Description of Group:  This group will allow patients to explore their thoughts and feelings about diagnoses they have received. Patients will be guided to explore their level of understanding and acceptance of these diagnoses. Facilitator will encourage patients to process their thoughts and feelings about the reactions of others to their diagnosis and will guide patients in identifying ways to discuss their diagnosis with significant others in their lives. This group will be process-oriented, with patients participating in exploration of their own experiences, giving and receiving support, and processing challenge from other group members.  Therapeutic Goals: 1. Patient will demonstrate understanding of diagnosis as evidenced by identifying two or more symptoms of the disorder 2. Patient will be able to express two feelings regarding the diagnosis 3. Patient will demonstrate their ability to communicate their needs through discussion and/or role play  Summary of Patient Progress: Patient remained present throughout group and participated actively and appropriately. She shared that she has felt judged and unwelcome at times in her church and her faith based recovery programs. She shared that diagnosis to her is "just another label."  Therapeutic Modalities:  Cognitive Behavioral Therapy Brief Therapy Feelings Identification   Stephanie Acre, MSW, [place] Social Worker

## 2019-07-02 LAB — GLUCOSE, CAPILLARY
Glucose-Capillary: 136 mg/dL — ABNORMAL HIGH (ref 70–99)
Glucose-Capillary: 113 mg/dL — ABNORMAL HIGH (ref 70–99)
Glucose-Capillary: 139 mg/dL — ABNORMAL HIGH (ref 70–99)

## 2019-07-02 MED ORDER — LISINOPRIL 5 MG PO TABS
5.0000 mg | ORAL_TABLET | ORAL | Status: AC
  Administered 2019-07-02: 5 mg via ORAL
  Filled 2019-07-02 (×2): qty 1

## 2019-07-02 MED ORDER — LISINOPRIL 10 MG PO TABS
10.0000 mg | ORAL_TABLET | Freq: Every day | ORAL | Status: DC
  Administered 2019-07-03 – 2019-07-04 (×2): 10 mg via ORAL
  Filled 2019-07-02 (×7): qty 1

## 2019-07-02 MED ORDER — TRAZODONE HCL 150 MG PO TABS
150.0000 mg | ORAL_TABLET | Freq: Every day | ORAL | Status: DC
  Administered 2019-07-02 – 2019-07-04 (×3): 150 mg via ORAL
  Filled 2019-07-02 (×5): qty 1

## 2019-07-02 NOTE — Progress Notes (Signed)
[facility] MD Progress Note  07/02/2019 11:12 AM Drisana Schweickert  MRN:  [id number] Subjective:  Patient is a 63 year old female with a past psychiatric history significant for major depression, generalized anxiety, and recent relapse on cocaine and perhaps heroin.  She was admitted on 06/30/2019 secondary to suicidal ideation.  Objective: Patient is seen and examined.  Patient is a 63 year old female with the above-stated past psychiatric history seen in follow-up.  She continues to slowly improve.  Her mood and affect are significantly better than on admission.  Her psychomotor agitation has decreased as well.  She is much less disorganized.  She has changed her mind and wants to go into a long-term residential treatment program.  She stated that she had contacted a facility, but that they needed a referral from the social worker.  We discussed that this a.m.  She denied any suicidal or homicidal ideation.  She denied any auditory or visual hallucinations.  Her blood pressure is slightly better today at 136/93.  She is still mildly tachycardic with a rate at 112.  She slept 6 hours last night.  Her blood sugar this morning is 91.  Principal Problem: <principal problem not specified> Diagnosis: Active Problems:   MDD (major depressive disorder)  Total Time spent with patient: 15 minutes  Past Psychiatric History: See admission H&P  Past Medical History:  Past Medical History:  Diagnosis Date  . Alcohol use   . Allergy   . Anxiety   . Asthma   . Childhood schizophrenia   . Chronic abdominal pain   . Collagen vascular disease ([place])   . COPD (chronic obstructive pulmonary disease) ([place])     Past Surgical History:  Procedure Laterality Date  . ABDOMINAL HYSTERECTOMY    . APPENDECTOMY     Family History:  Family History  Problem Relation Age of Onset  . Asthma Other   . COPD Other   . Alcoholism Other   . Heart attack Other   . Depression Other   . Mental illness Other   . Alcohol abuse  Mother   . Colon cancer Mother 63  . Alcohol abuse Father   . Depression Father    Family Psychiatric  History: See admission H&P Social History:  Social History   Substance and Sexual Activity  Alcohol Use No     Social History   Substance and Sexual Activity  Drug Use Yes  . Types: Marijuana, Cocaine    Social History   Socioeconomic History  . Marital status: Widowed    Spouse name: Not on file  . Number of children: 5  . Years of education: Not on file  . Highest education level: Some college, no degree  Occupational History  . Not on file  Social Needs  . Financial resource strain: Not hard at all  . Food insecurity    Worry: Sometimes true    Inability: Never true  . Transportation needs    Medical: No    Non-medical: No  Tobacco Use  . Smoking status: Current Every Day Smoker    Packs/day: 0.25    Types: Cigarettes  . Smokeless tobacco: Never Used  Substance and Sexual Activity  . Alcohol use: No  . Drug use: Yes    Types: Marijuana, Cocaine  . Sexual activity: Never  Lifestyle  . Physical activity    Days per week: Not on file    Minutes per session: Not on file  . Stress: Very much  Relationships  . Social connections  Talks on phone: Not on file    Gets together: Not on file    Attends religious service: Not on file    Active member of club or organization: Not on file    Attends meetings of clubs or organizations: More than 4 times per year    Relationship status: Widowed  Other Topics Concern  . Not on file  Social History Narrative  . Not on file   Additional Social History:                         Sleep: Fair  Appetite:  Fair  Current Medications: Current Facility-Administered Medications  Medication Dose Route Frequency Provider Last Rate Last Dose  . acetaminophen (TYLENOL) tablet 650 mg  650 mg Oral Q6H PRN Mordecai Maes, NP   650 mg at 07/01/19 2345  . albuterol (VENTOLIN HFA) 108 (90 Base) MCG/ACT inhaler 1-2  puff  1-2 puff Inhalation Q4H PRN Sharma Covert, MD   2 puff at 07/02/19 [phone]  . albuterol (VENTOLIN HFA) 108 (90 Base) MCG/ACT inhaler 2 puff  2 puff Inhalation Q4H PRN Mordecai Maes, NP   2 puff at 07/01/19 1709  . alum & mag hydroxide-simeth (MAALOX/MYLANTA) 200-200-20 MG/5ML suspension 30 mL  30 mL Oral Q4H PRN Mordecai Maes, NP      . ARIPiprazole (ABILIFY) tablet 5 mg  5 mg Oral Daily Sharma Covert, MD   5 mg at 07/02/19 1696  . cloNIDine (CATAPRES) tablet 0.1 mg  0.1 mg Oral TID PRN Sharma Covert, MD      . fluticasone (FLOVENT HFA) 44 MCG/ACT inhaler 2 puff  2 puff Inhalation BID Sharma Covert, MD   2 puff at 07/02/19 [phone]  . hydrOXYzine (ATARAX/VISTARIL) tablet 25 mg  25 mg Oral Q6H PRN Lindon Romp A, NP   25 mg at 07/02/19 0815  . ipratropium-albuterol (DUONEB) 0.5-2.5 (3) MG/3ML nebulizer solution 3 mL  3 mL Inhalation Q6H PRN Mordecai Maes, NP      . lisinopril (ZESTRIL) tablet 5 mg  5 mg Oral Daily Sharma Covert, MD   5 mg at 07/02/19 0815  . mometasone-formoterol (DULERA) 200-5 MCG/ACT inhaler 2 puff  2 puff Inhalation BID Mordecai Maes, NP   2 puff at 07/02/19 [phone]  . polyethylene glycol (MIRALAX / GLYCOLAX) packet 17 g  17 g Oral Daily Sharma Covert, MD   17 g at 07/02/19 [phone]  . traZODone (DESYREL) tablet 100 mg  100 mg Oral QHS Sharma Covert, MD   100 mg at 07/01/19 2158  . venlafaxine XR (EFFEXOR-XR) 24 hr capsule 225 mg  225 mg Oral Daily Sharma Covert, MD   225 mg at 07/02/19 2585    Lab Results:  Results for orders placed or performed during the hospital encounter of 06/29/19 (from the past 48 hour(s))  Glucose, capillary     Status: Abnormal   Collection Time: 07/01/19 12:02 PM  Result Value Ref Range   Glucose-Capillary 107 (H) 70 - 99 mg/dL   Comment 1 Notify RN    Comment 2 Document in Chart   Glucose, capillary     Status: Abnormal   Collection Time: 07/01/19  5:53 PM  Result Value Ref Range   Glucose-Capillary  129 (H) 70 - 99 mg/dL   Comment 1 Notify RN    Comment 2 Document in Chart   Basic metabolic panel     Status: Abnormal   Collection Time:  07/01/19  6:18 PM  Result Value Ref Range   Sodium 141 135 - 145 mmol/L   Potassium 3.8 3.5 - 5.1 mmol/L   Chloride 107 98 - 111 mmol/L   CO2 24 22 - 32 mmol/L   Glucose, Bld 106 (H) 70 - 99 mg/dL   BUN 14 6 - 20 mg/dL   Creatinine, Ser 0.63 0.44 - 1.00 mg/dL   Calcium 9.1 8.9 - 10.3 mg/dL   GFR calc non Af Amer >60 >60 mL/min   GFR calc Af Amer >60 >60 mL/min   Anion gap 10 5 - 15    Comment: Performed at [facility], [place] [address], [place], [place] [address]  Glucose, capillary     Status: None   Collection Time: 07/01/19  9:50 PM  Result Value Ref Range   Glucose-Capillary 91 70 - 99 mg/dL    Blood Alcohol level:  Lab Results  Component Value Date   ETH <10 06/28/2019   ETH <10 57/97/2820    Metabolic Disorder Labs: Lab Results  Component Value Date   HGBA1C 6.2 (H) 06/30/2019   MPG 131.24 06/30/2019   No results found for: PROLACTIN Lab Results  Component Value Date   CHOL 191 06/30/2019   TRIG 132 06/30/2019   HDL 32 (L) 06/30/2019   CHOLHDL 6.0 06/30/2019   VLDL 26 06/30/2019   LDLCALC 133 (H) 06/30/2019    Physical Findings: AIMS: Facial and Oral Movements Muscles of Facial Expression: None, normal Lips and Perioral Area: None, normal Jaw: None, normal Tongue: None, normal,Extremity Movements Upper (arms, wrists, hands, fingers): None, normal Lower (legs, knees, ankles, toes): None, normal, Trunk Movements Neck, shoulders, hips: None, normal, Overall Severity Severity of abnormal movements (highest score from questions above): None, normal Incapacitation due to abnormal movements: None, normal Patient's awareness of abnormal movements (rate only patient's report): No Awareness, Dental Status Current problems with teeth and/or dentures?: No Does patient usually wear dentures?: No  CIWA:   CIWA-Ar Total: 1 COWS:  COWS Total Score: 2  Musculoskeletal: Strength & Muscle Tone: within normal limits Gait & Station: normal Patient leans: N/A  Psychiatric Specialty Exam: Physical Exam  Nursing note and vitals reviewed. Constitutional: She is oriented to person, place, and time. She appears well-developed and well-nourished.  HENT:  Head: Normocephalic and atraumatic.  Respiratory: Effort normal.  Neurological: She is alert and oriented to person, place, and time.    ROS  Blood pressure (!) 136/93, pulse (!) 112, temperature 98.6 F (37 C), temperature source Oral, resp. rate 20, SpO2 98 %.There is no height or weight on file to calculate BMI.  General Appearance: Casual  Eye Contact:  Fair  Speech:  Normal Rate  Volume:  Normal  Mood:  Anxious  Affect:  Congruent  Thought Process:  Coherent and Descriptions of Associations: Intact  Orientation:  Full (Time, Place, and Person)  Thought Content:  Logical  Suicidal Thoughts:  No  Homicidal Thoughts:  No  Memory:  Immediate;   Fair Recent;   Fair Remote;   Fair  Judgement:  Intact  Insight:  Fair  Psychomotor Activity:  Increased  Concentration:  Concentration: Fair and Attention Span: Fair  Recall:  [employer] of Knowledge:  Fair  Language:  Fair  Akathisia:  Negative  Handed:  Right  AIMS (if indicated):     Assets:  Desire for Improvement Resilience  ADL's:  Intact  Cognition:  WNL  Sleep:  Number of Hours: 6  Treatment Plan Summary: Daily contact with patient to assess and evaluate symptoms and progress in treatment, Medication management and Plan  : Patient is seen and examined.  Patient is a 64 year old female with the above-stated past psychiatric history who is seen in follow-up.   Diagnosis: #1 major depression, recurrent, severe, #2 cocaine dependence, #3 marijuana dependence, #4 generalized anxiety disorder, #5 COPD, #6 possible hypothyroidism, #7 hypokalemia, #8 diabetes mellitus type  2  Patient is seen in follow-up.  She is doing better today.  She did state that BuSpar makes her feel "weird", and that will be stopped today.  She continues on Abilify 5 mg p.o. daily, venlafaxine 24-hour capsule 225 mg p.o. daily.  Her blood sugar is stable.  Her blood pressure still elevated, so I will increase her lisinopril to 10 mg p.o. daily.  The T4 for the evaluation of hypothyroidism has not come back, and I am going to reorder that.  I will also order a basic metabolic panel because of starting the ACE inhibitor's.  I will increase her trazodone to 150 mg p.o. nightly just to improve her sleep.  The patient now has decided she wants to go to a rehabilitation program, and social work will work with her on that issue. 1.  Continue albuterol HFA 1 to 2 puffs p.o. every 4 hours as needed shortness of breath for COPD. 2.  Continue Abilify 5 mg p.o. daily for mood stability as well as augmentation for depression treatment. 3.  Stop BuSpar 4. increase  llisinopril to 10 mg p.o. daily for hypertension and renal protection. 5.  Continue Catapres 0.1 mg p.o. 3 times daily as needed diastolic blood pressure greater than 100. 6.  Continue Flovent HFA 2 puffs twice daily for COPD. 7.  Increase hydroxyzine to 50 mg p.o. every 6 hours as needed anxiety. 8.  DuoNeb inhalations every 6 hours as needed shortness of breath. 9.  Continue Dulera 2 puffs twice daily for COPD. 10.  Continue MiraLAX daily for constipation. 11.    T4 and metabolic panel in a.m. tomorrow 12.  Increase trazodone to 150 mg p.o. nightly for sleep. 13.  Continue with Effexor extended release 225 mg p.o. daily for depression and anxiety. 14.    Change blood sugars to daily for diabetes mellitus type 2. 15.  Await results of T4 to make a determination on necessity of treatment of possible hypothyroidism. 16.  Disposition planning-in progress.  Sharma Covert, MD 07/02/2019, 11:12 AM

## 2019-07-02 NOTE — Plan of Care (Signed)
  Problem: Education: Goal: Knowledge of [facility] Education information/materials will improve Outcome: Progressing   

## 2019-07-02 NOTE — Progress Notes (Addendum)
CSW attempting to follow up with patient's plan to go to [facility] in [place] [phone], [phone].  Patient has called and left 3 voicemails for the intake coordinator. CSW has called and left 3 voicemails. CSW has not received a referral form to complete, and there is not a form on their website.  This was communicated to patient's daughter, Deneise Lever [phone]). CSW provided Deneise Lever with CSW's phone number and fax number to relay to [facility], should she be able to reach their intake coordinator, as the daughter has called [facility] several times a day as well.  Daughter expressed concerns for being being discharged before August 10th. CSW reiterated that [facility] is an acute care facility, and that it is highly unlikely the patient would continue to meet inpatient critieria. CSW explained a second time that placement is not criteria for continuing hospitalization.  Daughter stated that she does not feel that her home is a therapeutic enough environment for the patient and the daughter does not wish for the patient to return to her own home.   Update 2:40pm Patient completed phone interview with Caren Griffins at [facility] called and Caren Griffins verifies she has all necessary information and does not need CSW to fax anything. Patient's admission's decision will be finalized Monday or Tuesday.  Stephanie Acre, LCSW-A Clinical Social Worker

## 2019-07-02 NOTE — Progress Notes (Signed)
Recreation Therapy Notes  Date:  7.31.20 Time: 0930 Location: 300 Hall Dayroom  Group Topic: Stress Management  Goal Area(s) Addresses:  Patient will identify positive stress management techniques. Patient will identify benefits of using stress management post d/c.  Behavioral Response: Engaged  Intervention: Stress Management  Activity : Guided Imagery.  LRT read a script that allowed patients to take a mental vacation.  Patients were to listen as the script was read to engage in the activity.   Education:  Stress Management, Discharge Planning.   Education Outcome: Acknowledges Education  Clinical Observations/Feedback: Pt attended and participated in group.     Victorino Sparrow, LRT/CTRS         Ria Comment, Mya Suell A 07/02/2019 10:45 AM

## 2019-07-02 NOTE — Progress Notes (Signed)
CSW attempting to follow up with patient's plan to go to [facility] in [place] [phone], [phone].  Patient has called and left 3 voicemails for the intake coordinator. CSW has called and left 2 voicemails. CSW has not received a referral form to complete, and there is not a form on their website.  Stephanie Acre, LCSW-A Clinical Social Worker

## 2019-07-02 NOTE — Progress Notes (Signed)
D Pt presents to the 300 hall med window this am. [facility] is well dressed and clean. She endorses a flat, depressed and quite anxious affect. She is talking non stop- from the time she presents tot he med window.     A She completed her daily assessment and on this she wrote she has experienced SI today but contracts with this writer to not hurt herslef. She rated her depression, hopelessness anda xneity " 4/5/4", respectively.      R Safety is in place and poc cont.

## 2019-07-02 NOTE — [facility] Group Notes (Signed)
Adult Psychoeducational Group Note  Date:  07/02/2019 Time:  9:51 PM  Group Topic/Focus:  Wrap-Up Group:   The focus of this group is to help patients review their daily goal of treatment and discuss progress on daily workbooks.  Participation Level:  Active  Participation Quality:  Appropriate  Affect:  Appropriate  Cognitive:  Appropriate  Insight: Appropriate  Engagement in Group:  Supportive  Modes of Intervention:  Support  Additional Comments:   Ronni Rumble 07/02/2019, 9:51 PM

## 2019-07-02 NOTE — Progress Notes (Signed)
[place] NOVEL CORONAVIRUS (COVID-19) DAILY CHECK-OFF SYMPTOMS - answer yes or no to each - every day NO YES  Have you had a fever in the past 24 hours?  . Fever (Temp > 37.80C / 100F) X   Have you had any of these symptoms in the past 24 hours? . New Cough .  Sore Throat  .  Shortness of Breath .  Difficulty Breathing .  Unexplained Body Aches   X   Have you had any one of these symptoms in the past 24 hours not related to allergies?   . Runny Nose .  Nasal Congestion .  Sneezing   X   If you have had runny nose, nasal congestion, sneezing in the past 24 hours, has it worsened?  X   EXPOSURES - check yes or no X   Have you traveled outside the state in the past 14 days?  X   Have you been in contact with someone with a confirmed diagnosis of COVID-19 or PUI in the past 14 days without wearing appropriate PPE?  X   Have you been living in the same home as a person with confirmed diagnosis of COVID-19 or a PUI (household contact)?    X   Have you been diagnosed with COVID-19?    X              What to do next: Answered NO to all: Answered YES to anything:   Proceed with unit schedule Follow the [facility] Inpatient Flowsheet.   

## 2019-07-03 DIAGNOSIS — F332 Major depressive disorder, recurrent severe without psychotic features: Secondary | ICD-10-CM

## 2019-07-03 DIAGNOSIS — R45851 Suicidal ideations: Secondary | ICD-10-CM

## 2019-07-03 DIAGNOSIS — F1494 Cocaine use, unspecified with cocaine-induced mood disorder: Secondary | ICD-10-CM

## 2019-07-03 DIAGNOSIS — F12288 Cannabis dependence with other cannabis-induced disorder: Secondary | ICD-10-CM

## 2019-07-03 DIAGNOSIS — F411 Generalized anxiety disorder: Secondary | ICD-10-CM

## 2019-07-03 LAB — GLUCOSE, CAPILLARY
Glucose-Capillary: 102 mg/dL — ABNORMAL HIGH (ref 70–99)
Glucose-Capillary: 98 mg/dL (ref 70–99)
Glucose-Capillary: 122 mg/dL — ABNORMAL HIGH (ref 70–99)

## 2019-07-03 LAB — BASIC METABOLIC PANEL
Anion gap: 9 (ref 5–15)
BUN: 11 mg/dL (ref 6–20)
CO2: 24 mmol/L (ref 22–32)
Calcium: 8.8 mg/dL — ABNORMAL LOW (ref 8.9–10.3)
Chloride: 106 mmol/L (ref 98–111)
Creatinine, Ser: 0.62 mg/dL (ref 0.44–1.00)
GFR calc Af Amer: >60 mL/min (ref >60)
GFR calc non Af Amer: >60 mL/min (ref >60)
Glucose, Bld: 143 mg/dL — ABNORMAL HIGH (ref 70–99)
Potassium: 4 mmol/L (ref 3.5–5.1)
Sodium: 139 mmol/L (ref 135–145)

## 2019-07-03 LAB — T4, FREE: Free T4: 0.91 ng/dL (ref 0.61–1.12)

## 2019-07-03 MED ORDER — ARIPIPRAZOLE 10 MG PO TABS
10.0000 mg | ORAL_TABLET | Freq: Every day | ORAL | Status: DC
  Administered 2019-07-03: 5 mg via ORAL
  Administered 2019-07-04 – 2019-07-07 (×4): 10 mg via ORAL
  Filled 2019-07-03 (×6): qty 1

## 2019-07-03 MED ORDER — ARIPIPRAZOLE 5 MG PO TABS
5.0000 mg | ORAL_TABLET | Freq: Once | ORAL | Status: DC
  Filled 2019-07-03: qty 1

## 2019-07-03 NOTE — Progress Notes (Signed)
D Patient is seen OOB UAL on the 300 hall today. She tolerates this fairly well. HEr mood remains labile, she frequently presents in crying-like states, does not demonstrate with active tears. More like " episodes". She endorses a flat, depressed, affect. She demonstrates a depressed affect.     A She completed her daily assessment and on this she wrote she has experienced SI today . She contracts with this nurse not to hurt herself today. She rates her depression, hopelessenss and anxiety " 9/3/8", respectively. She attended her LIfe SKills group today, took an active part in the group discussion and shared personal feelings with the group, on identifying her unhealthy behaviors.     R Safety is in place and poc includes continuing to foster therapeutic relationship.

## 2019-07-03 NOTE — [facility] Group Notes (Signed)
[facility] Group Notes:  (Nursing)  Date:  07/03/2019  Time:  115 PM Type of Therapy:  Nurse Education  Participation Level:  Active  Participation Quality:  Appropriate  Affect:  Appropriate  Cognitive:  Appropriate  Insight:  Appropriate  Engagement in Group:  Engaged  Modes of Intervention:  Education  Summary of Progress/Problems: Life Skills Group Waymond Cera 07/03/2019, 2:01 PM

## 2019-07-03 NOTE — Progress Notes (Signed)
[place] NOVEL CORONAVIRUS (COVID-19) DAILY CHECK-OFF SYMPTOMS - answer yes or no to each - every day NO YES  Have you had a fever in the past 24 hours?  . Fever (Temp > 37.80C / 100F) X   Have you had any of these symptoms in the past 24 hours? . New Cough .  Sore Throat  .  Shortness of Breath .  Difficulty Breathing .  Unexplained Body Aches   X   Have you had any one of these symptoms in the past 24 hours not related to allergies?   . Runny Nose .  Nasal Congestion .  Sneezing   X   If you have had runny nose, nasal congestion, sneezing in the past 24 hours, has it worsened?  X   EXPOSURES - check yes or no X   Have you traveled outside the state in the past 14 days?  X   Have you been in contact with someone with a confirmed diagnosis of COVID-19 or PUI in the past 14 days without wearing appropriate PPE?  X   Have you been living in the same home as a person with confirmed diagnosis of COVID-19 or a PUI (household contact)?    X   Have you been diagnosed with COVID-19?    X              What to do next: Answered NO to all: Answered YES to anything:   Proceed with unit schedule Follow the [facility] Inpatient Flowsheet.   

## 2019-07-03 NOTE — Progress Notes (Signed)
D   Pt is pleasant on approach and cooperative   She interacts well with staff and peers   Reports some improvement in her mood and outlook A   Verbal support given    Medications administered and effectiveness monitored    Q 15 min checks R   Pt is safe at this time

## 2019-07-03 NOTE — Progress Notes (Signed)
[place] NOVEL CORONAVIRUS (COVID-19) DAILY CHECK-OFF SYMPTOMS - answer yes or no to each - every day NO YES  Have you had a fever in the past 24 hours?  . Fever (Temp > 37.80C / 100F) X   Have you had any of these symptoms in the past 24 hours? . New Cough .  Sore Throat  .  Shortness of Breath .  Difficulty Breathing .  Unexplained Body Aches   X   Have you had any one of these symptoms in the past 24 hours not related to allergies?   . Runny Nose .  Nasal Congestion .  Sneezing   X   If you have had runny nose, nasal congestion, sneezing in the past 24 hours, has it worsened?  X   EXPOSURES - check yes or no X   Have you traveled outside the state in the past 14 days?  X   Have you been in contact with someone with a confirmed diagnosis of COVID-19 or PUI in the past 14 days without wearing appropriate PPE?  X   Have you been living in the same home as a person with confirmed diagnosis of COVID-19 or a PUI (household contact)?    X   Have you been diagnosed with COVID-19?    X              What to do next: Answered NO to all: Answered YES to anything:   Proceed with unit schedule Follow the [facility] Inpatient Flowsheet.   

## 2019-07-03 NOTE — Plan of Care (Signed)
  Problem: Education: Goal: Mental status will improve Outcome: Progressing   

## 2019-07-03 NOTE — Progress Notes (Addendum)
[facility] MD Progress Note  07/03/2019 12:39 PM Jasmine Werner  MRN:  [id number]  Subjective: Jasmine Werner reports " this morning I was feeling irritable, angry, upset and crying for no reason.  I am just having a lot of agitation and frustration with myself. "   Evaluation: Mckenzie observed sitting in day room interacting with peers.  She reported fleeting suicidal ideations, that is intermittent in nature.  Reports a history of self injures behaviors by cutting.  Rates her depression 7 out of 10 with 10 being the worst.  Patient appears to be future and goal oriented as she states she is hopeful to follow-up with residential treatment in [facility]. Start she is hopeful to get treatment for my drinking habits.  Denies cravings, tremors or withdrawal symptoms.  She reports her daughter has been supportive throughout this admission. Chart reviewed T4 still pending results.   Patient was initiated on Effexor 225 and Abilify.  Discussed titration to Abilify patient was receptive to plan.  Reports fair appetite.  States she is resting okay throughout the night.  Support, encouragement and reassurance provided.  Principal Problem: MDD (major depressive disorder) Diagnosis: Principal Problem:   MDD (major depressive disorder)  Total Time spent with patient: 15 minutes  Past Psychiatric History: See admission H&P  Past Medical History:  Past Medical History:  Diagnosis Date  . Alcohol use   . Allergy   . Anxiety   . Asthma   . Childhood schizophrenia   . Chronic abdominal pain   . Collagen vascular disease ([place])   . COPD (chronic obstructive pulmonary disease) ([place])     Past Surgical History:  Procedure Laterality Date  . ABDOMINAL HYSTERECTOMY    . APPENDECTOMY     Family History:  Family History  Problem Relation Age of Onset  . Asthma Other   . COPD Other   . Alcoholism Other   . Heart attack Other   . Depression Other   . Mental illness Other   . Alcohol abuse Mother   . Colon cancer  Mother 69  . Alcohol abuse Father   . Depression Father    Family Psychiatric  History: See admission H&P Social History:  Social History   Substance and Sexual Activity  Alcohol Use No     Social History   Substance and Sexual Activity  Drug Use Yes  . Types: Marijuana, Cocaine    Social History   Socioeconomic History  . Marital status: Widowed    Spouse name: Not on file  . Number of children: 5  . Years of education: Not on file  . Highest education level: Some college, no degree  Occupational History  . Not on file  Social Needs  . Financial resource strain: Not hard at all  . Food insecurity    Worry: Sometimes true    Inability: Never true  . Transportation needs    Medical: No    Non-medical: No  Tobacco Use  . Smoking status: Current Every Day Smoker    Packs/day: 0.25    Types: Cigarettes  . Smokeless tobacco: Never Used  Substance and Sexual Activity  . Alcohol use: No  . Drug use: Yes    Types: Marijuana, Cocaine  . Sexual activity: Never  Lifestyle  . Physical activity    Days per week: Not on file    Minutes per session: Not on file  . Stress: Very much  Relationships  . Social Herbalist on phone: Not  on file    Gets together: Not on file    Attends religious service: Not on file    Active member of club or organization: Not on file    Attends meetings of clubs or organizations: More than 4 times per year    Relationship status: Widowed  Other Topics Concern  . Not on file  Social History Narrative  . Not on file   Additional Social History:                         Sleep: Fair  Appetite:  Fair  Current Medications: Current Facility-Administered Medications  Medication Dose Route Frequency Provider Last Rate Last Dose  . acetaminophen (TYLENOL) tablet 650 mg  650 mg Oral Q6H PRN Mordecai Maes, NP   650 mg at 07/02/19 1346  . albuterol (VENTOLIN HFA) 108 (90 Base) MCG/ACT inhaler 1-2 puff  1-2 puff  Inhalation Q4H PRN Sharma Covert, MD   2 puff at 07/03/19 [phone]  . alum & mag hydroxide-simeth (MAALOX/MYLANTA) 200-200-20 MG/5ML suspension 30 mL  30 mL Oral Q4H PRN Mordecai Maes, NP      . Derrill Memo ON 07/04/2019] ARIPiprazole (ABILIFY) tablet 10 mg  10 mg Oral Daily Derrill Center, NP      . ARIPiprazole (ABILIFY) tablet 5 mg  5 mg Oral Once Sharma Covert, MD      . cloNIDine (CATAPRES) tablet 0.1 mg  0.1 mg Oral TID PRN Sharma Covert, MD   0.1 mg at 07/03/19 0620  . fluticasone (FLOVENT HFA) 44 MCG/ACT inhaler 2 puff  2 puff Inhalation BID Sharma Covert, MD   2 puff at 07/03/19 0830  . hydrOXYzine (ATARAX/VISTARIL) tablet 25 mg  25 mg Oral Q6H PRN Lindon Romp A, NP   25 mg at 07/02/19 2144  . ipratropium-albuterol (DUONEB) 0.5-2.5 (3) MG/3ML nebulizer solution 3 mL  3 mL Inhalation Q6H PRN Mordecai Maes, NP      . lisinopril (ZESTRIL) tablet 10 mg  10 mg Oral Daily Sharma Covert, MD   10 mg at 07/03/19 6834  . mometasone-formoterol (DULERA) 200-5 MCG/ACT inhaler 2 puff  2 puff Inhalation BID Mordecai Maes, NP   2 puff at 07/03/19 0829  . polyethylene glycol (MIRALAX / GLYCOLAX) packet 17 g  17 g Oral Daily Sharma Covert, MD   17 g at 07/03/19 1962  . traZODone (DESYREL) tablet 150 mg  150 mg Oral QHS Sharma Covert, MD   150 mg at 07/02/19 2144  . venlafaxine XR (EFFEXOR-XR) 24 hr capsule 225 mg  225 mg Oral Daily Sharma Covert, MD   225 mg at 07/03/19 2297    Lab Results:  Results for orders placed or performed during the hospital encounter of 06/29/19 (from the past 48 hour(s))  Glucose, capillary     Status: Abnormal   Collection Time: 07/01/19  5:53 PM  Result Value Ref Range   Glucose-Capillary 129 (H) 70 - 99 mg/dL   Comment 1 Notify RN    Comment 2 Document in Chart   Basic metabolic panel     Status: Abnormal   Collection Time: 07/01/19  6:18 PM  Result Value Ref Range   Sodium 141 135 - 145 mmol/L   Potassium 3.8 3.5 - 5.1 mmol/L    Chloride 107 98 - 111 mmol/L   CO2 24 22 - 32 mmol/L   Glucose, Bld 106 (H) 70 - 99 mg/dL   BUN  14 6 - 20 mg/dL   Creatinine, Ser 0.63 0.44 - 1.00 mg/dL   Calcium 9.1 8.9 - 10.3 mg/dL   GFR calc non Af Amer >60 >60 mL/min   GFR calc Af Amer >60 >60 mL/min   Anion gap 10 5 - 15    Comment: Performed at [facility], [place] [address] [place], [place] [address]  Glucose, capillary     Status: None   Collection Time: 07/01/19  9:50 PM  Result Value Ref Range   Glucose-Capillary 91 70 - 99 mg/dL  Glucose, capillary     Status: Abnormal   Collection Time: 07/02/19 11:57 AM  Result Value Ref Range   Glucose-Capillary 136 (H) 70 - 99 mg/dL  Glucose, capillary     Status: Abnormal   Collection Time: 07/02/19  5:26 PM  Result Value Ref Range   Glucose-Capillary 113 (H) 70 - 99 mg/dL  Glucose, capillary     Status: Abnormal   Collection Time: 07/02/19  9:18 PM  Result Value Ref Range   Glucose-Capillary 139 (H) 70 - 99 mg/dL   Comment 1 Notify RN    Comment 2 Document in Chart   Basic metabolic panel     Status: Abnormal   Collection Time: 07/03/19  7:53 AM  Result Value Ref Range   Sodium 139 135 - 145 mmol/L   Potassium 4.0 3.5 - 5.1 mmol/L   Chloride 106 98 - 111 mmol/L   CO2 24 22 - 32 mmol/L   Glucose, Bld 143 (H) 70 - 99 mg/dL   BUN 11 6 - 20 mg/dL   Creatinine, Ser 0.62 0.44 - 1.00 mg/dL   Calcium 8.8 (L) 8.9 - 10.3 mg/dL   GFR calc non Af Amer >60 >60 mL/min   GFR calc Af Amer >60 >60 mL/min   Anion gap 9 5 - 15    Comment: Performed at [facility], [place] [address] [place], [place] [address]  Glucose, capillary     Status: Abnormal   Collection Time: 07/03/19 12:07 PM  Result Value Ref Range   Glucose-Capillary 102 (H) 70 - 99 mg/dL   Comment 1 Notify RN    Comment 2 Document in Chart     Blood Alcohol level:  Lab Results  Component Value Date   ETH <10 06/28/2019   ETH <10 41/63/8453    Metabolic Disorder Labs: Lab Results   Component Value Date   HGBA1C 6.2 (H) 06/30/2019   MPG 131.24 06/30/2019   No results found for: PROLACTIN Lab Results  Component Value Date   CHOL 191 06/30/2019   TRIG 132 06/30/2019   HDL 32 (L) 06/30/2019   CHOLHDL 6.0 06/30/2019   VLDL 26 06/30/2019   LDLCALC 133 (H) 06/30/2019    Physical Findings: AIMS: Facial and Oral Movements Muscles of Facial Expression: None, normal Lips and Perioral Area: None, normal Jaw: None, normal Tongue: None, normal,Extremity Movements Upper (arms, wrists, hands, fingers): None, normal Lower (legs, knees, ankles, toes): None, normal, Trunk Movements Neck, shoulders, hips: None, normal, Overall Severity Severity of abnormal movements (highest score from questions above): None, normal Incapacitation due to abnormal movements: None, normal Patient's awareness of abnormal movements (rate only patient's report): No Awareness, Dental Status Current problems with teeth and/or dentures?: No Does patient usually wear dentures?: No  CIWA:  CIWA-Ar Total: 1 COWS:  COWS Total Score: 2  Musculoskeletal: Strength & Muscle Tone: within normal limits Gait & Station: normal Patient leans: N/A  Psychiatric Specialty Exam: Physical Exam  Nursing note and vitals reviewed. Constitutional: She is oriented to person, place, and time. She appears well-developed and well-nourished.  HENT:  Head: Normocephalic and atraumatic.  Respiratory: Effort normal.  Neurological: She is alert and oriented to person, place, and time.  Psychiatric: She has a normal mood and affect. Her behavior is normal.    Review of Systems  Psychiatric/Behavioral: Positive for depression and suicidal ideas. The patient is nervous/anxious.   All other systems reviewed and are negative.   Blood pressure 108/78, pulse 98, temperature 98.9 F (37.2 C), temperature source Oral, resp. rate 20, SpO2 98 %.There is no height or weight on file to calculate BMI.  General Appearance:  Casual  Eye Contact:  Fair  Speech:  Normal Rate  Volume:  Normal  Mood:  Anxious  Affect:  Congruent  Thought Process:  Coherent and Descriptions of Associations: Intact  Orientation:  Full (Time, Place, and Person)  Thought Content:  Logical  Suicidal Thoughts:  No  Homicidal Thoughts:  No  Memory:  Immediate;   Fair Recent;   Fair Remote;   Fair  Judgement:  Intact  Insight:  Fair  Psychomotor Activity:  Increased  Concentration:  Concentration: Fair and Attention Span: Fair  Recall:  [employer] of Knowledge:  Fair  Language:  Fair  Akathisia:  Negative  Handed:  Right  AIMS (if indicated):     Assets:  Desire for Improvement Resilience  ADL's:  Intact  Cognition:  WNL  Sleep:  Number of Hours: 6.5     Treatment Plan Summary: Daily contact with patient to assess and evaluate symptoms and progress in treatment and Medication management   Diagnosis: #1 major depression, recurrent, severe, #2 cocaine dependence, #3 marijuana dependence, #4 generalized anxiety disorder, #5 COPD, #6 possible hypothyroidism, #7 hypokalemia, #8 diabetes mellitus type 2   Increased  Abilify 5 mg p.o. to 10 mg  daily for mood stability as well as augmentation for depression treatment. Discontinued  BuSpar on 07/02/2019 continue  llisinopril to 10 mg p.o. daily for hypertension and renal protection. Continue Catapres 0.1 mg p.o. 3 times daily as needed diastolic blood pressure greater than 100. continue  hydroxyzine to 50 mg p.o. every 6 hours as needed anxiety.  Pending results:  T4 and metabolic panel in a.m.- pending results  Continue  trazodone to 150 mg p.o. nightly for sleep.  Continue with Effexor extended release 225 mg p.o. daily for depression and anxiety.  Other medical concerns addressed:  Change blood sugars to daily for diabetes mellitus type 2.  Await results of T4 to make a determination on necessity of treatment of possible hypothyroidism.- results pending   CSW to  continue working on discharge disposition Patient encouraged to participate within the therapeutic milieu  Derrill Center, NP 07/03/2019, 12:39 PM

## 2019-07-04 LAB — GLUCOSE, CAPILLARY: Glucose-Capillary: 109 mg/dL — ABNORMAL HIGH (ref 70–99)

## 2019-07-04 MED ORDER — OXCARBAZEPINE 150 MG PO TABS
75.0000 mg | ORAL_TABLET | Freq: Two times a day (BID) | ORAL | Status: DC
  Filled 2019-07-04 (×5): qty 0.5

## 2019-07-04 MED ORDER — LORAZEPAM 1 MG PO TABS
1.0000 mg | ORAL_TABLET | Freq: Two times a day (BID) | ORAL | Status: DC | PRN
  Administered 2019-07-04 – 2019-07-07 (×5): 1 mg via ORAL
  Filled 2019-07-04 (×5): qty 1

## 2019-07-04 MED ORDER — HYDROXYZINE HCL 25 MG PO TABS
25.0000 mg | ORAL_TABLET | Freq: Once | ORAL | Status: AC
  Administered 2019-07-04: 25 mg via ORAL
  Filled 2019-07-04: qty 1

## 2019-07-04 MED ORDER — HALOPERIDOL 5 MG PO TABS
5.0000 mg | ORAL_TABLET | Freq: Once | ORAL | Status: DC
  Filled 2019-07-04: qty 1

## 2019-07-04 MED ORDER — QUETIAPINE FUMARATE 25 MG PO TABS
25.0000 mg | ORAL_TABLET | Freq: Every day | ORAL | Status: DC
  Administered 2019-07-04: 25 mg via ORAL
  Filled 2019-07-04 (×2): qty 1

## 2019-07-04 MED ORDER — HALOPERIDOL 5 MG PO TABS
ORAL_TABLET | ORAL | Status: AC
  Filled 2019-07-04: qty 1

## 2019-07-04 NOTE — Progress Notes (Signed)
D.  Pt pleasant but anxious on approach, complaint of anxiety.  Pt observed up and appropriately active within milieu.  Pt denies SI/HI/AVH at this time.  Pt states that she is pleased to be starting Seroquel and hopeful that it will help her.  A. Support and encouragement offered, medication given as ordered  R.  Pt remains safe on the unit, will continue to monitor.

## 2019-07-04 NOTE — Progress Notes (Signed)
D Patient has  Endorsed  mood instability and diff modulating her behavior today. She endorses staff splitting this afternoon, when she began to " nurse shop"- going form one staff nurse to another- crying, yelling, speaking loudly out in the open milieu despite this writer's encouragement to speak quietly and privately with staff about her personal problems- she was adamant that " ive got to have my ativan back..I'm going crazy".       A SHe completed her daily assessment this am and on this she wrote she denied having SI today and she rated her depression, hopelessness and anxiety " 7/7/9", respectively. She has spent the early part of this day verbalizing her discomfort and rage at having haldol offered to her by staff RN last night- as a sleep medicine ( this is her interpretation). After she quit talking about that she began to focus on the discord she said she is having with her daughter today- she wanted the daughter to take her ativan prescription to the pharmacy and have it filled and the daughter was reluctant to do so. When she became agitated and  Began escalating  in the 300 hall, refusing to calm down, this Probation officer contacted Dr Mallie Darting and pt was given ativan 1 mg po per MD order. Pt made aware by this writer that she should understand rehab programs do not administer benzodiazepines. She replied " Im not worried about that".     R Safety in place and pt calm by 1815.

## 2019-07-04 NOTE — [facility] Group Notes (Signed)
[facility] Group Notes:  (Nursing)  Date:  07/04/2019  Time: 130 PM Type of Therapy:  Nurse Education  Participation Level:  Active  Participation Quality:  Appropriate  Affect:  Appropriate  Cognitive:  Appropriate  Insight:  Appropriate  Engagement in Group:  Engaged  Modes of Intervention:  Education  Summary of Progress/Problems: Life Skills Group  Waymond Cera 07/04/2019, 6:09 PM

## 2019-07-04 NOTE — Progress Notes (Signed)
[place] NOVEL CORONAVIRUS (COVID-19) DAILY CHECK-OFF SYMPTOMS - answer yes or no to each - every day NO YES  Have you had a fever in the past 24 hours?  . Fever (Temp > 37.80C / 100F) X   Have you had any of these symptoms in the past 24 hours? . New Cough .  Sore Throat  .  Shortness of Breath .  Difficulty Breathing .  Unexplained Body Aches   X   Have you had any one of these symptoms in the past 24 hours not related to allergies?   . Runny Nose .  Nasal Congestion .  Sneezing   X   If you have had runny nose, nasal congestion, sneezing in the past 24 hours, has it worsened?  X   EXPOSURES - check yes or no X   Have you traveled outside the state in the past 14 days?  X   Have you been in contact with someone with a confirmed diagnosis of COVID-19 or PUI in the past 14 days without wearing appropriate PPE?  X   Have you been living in the same home as a person with confirmed diagnosis of COVID-19 or a PUI (household contact)?    X   Have you been diagnosed with COVID-19?    X              What to do next: Answered NO to all: Answered YES to anything:   Proceed with unit schedule Follow the [facility] Inpatient Flowsheet.   

## 2019-07-04 NOTE — Progress Notes (Signed)
Pt actively med seeking, getting up, walking around, switching between being in pain and manic rambling

## 2019-07-04 NOTE — Progress Notes (Addendum)
[facility] MD Progress Note  07/04/2019 8:02 AM Shaquayla Klimas  MRN:  [id number]  Subjective: Erick reports " I am not able to sleep and I need my ativan or klonopin back, just because I slipped up and had a  little weed and alcohol that's no reason to punish me . "   Evaluation: Magdelena present irritably and slightly confrontational during this assessment.  She denies suicidal or homicidal ideation.  She reports worsening anxiety related to medication adjustments.  She reports she is followed by [facility] health provider who writes her for Ativan.  She states that some of her medications may have gotten stolen and she does not abuse medications.  Rates her depression 8 out of 10 with 10 being the worst.  Patient continues to vacillate regarding discharging to a residential treatment facility due to not being able to take prescribed controlled substances.  States she will think about attending residential facility in [place] at this will be an issue for her because she needs her Ativan.  Patient has requested to discontinue Vistaril as she states is not helping her mood.  Reports a good appetite.  States she is not resting throughout the night.  Discussed initiating 25 mg Seroquel p.o. nightly patient was receptive to plan. Patient declined Trileptal at this time. Support, encouragement and reassurance was provided.  Principal Problem: MDD (major depressive disorder) Diagnosis: Principal Problem:   MDD (major depressive disorder)  Total Time spent with patient: 15 minutes  Past Psychiatric History: See admission H&P  Past Medical History:  Past Medical History:  Diagnosis Date  . Alcohol use   . Allergy   . Anxiety   . Asthma   . Childhood schizophrenia   . Chronic abdominal pain   . Collagen vascular disease ([place])   . COPD (chronic obstructive pulmonary disease) ([place])     Past Surgical History:  Procedure Laterality Date  . ABDOMINAL HYSTERECTOMY    . APPENDECTOMY     Family History:  Family  History  Problem Relation Age of Onset  . Asthma Other   . COPD Other   . Alcoholism Other   . Heart attack Other   . Depression Other   . Mental illness Other   . Alcohol abuse Mother   . Colon cancer Mother 63  . Alcohol abuse Father   . Depression Father    Family Psychiatric  History: See admission H&P Social History:  Social History   Substance and Sexual Activity  Alcohol Use No     Social History   Substance and Sexual Activity  Drug Use Yes  . Types: Marijuana, Cocaine    Social History   Socioeconomic History  . Marital status: Widowed    Spouse name: Not on file  . Number of children: 5  . Years of education: Not on file  . Highest education level: Some college, no degree  Occupational History  . Not on file  Social Needs  . Financial resource strain: Not hard at all  . Food insecurity    Worry: Sometimes true    Inability: Never true  . Transportation needs    Medical: No    Non-medical: No  Tobacco Use  . Smoking status: Current Every Day Smoker    Packs/day: 0.25    Types: Cigarettes  . Smokeless tobacco: Never Used  Substance and Sexual Activity  . Alcohol use: No  . Drug use: Yes    Types: Marijuana, Cocaine  . Sexual activity: Never  Lifestyle  .  Physical activity    Days per week: Not on file    Minutes per session: Not on file  . Stress: Very much  Relationships  . Social Herbalist on phone: Not on file    Gets together: Not on file    Attends religious service: Not on file    Active member of club or organization: Not on file    Attends meetings of clubs or organizations: More than 4 times per year    Relationship status: Widowed  Other Topics Concern  . Not on file  Social History Narrative  . Not on file   Additional Social History:                         Sleep: Fair  Appetite:  Fair  Current Medications: Current Facility-Administered Medications  Medication Dose Route Frequency Provider Last  Rate Last Dose  . haloperidol (HALDOL) 5 MG tablet           . acetaminophen (TYLENOL) tablet 650 mg  650 mg Oral Q6H PRN Mordecai Maes, NP   650 mg at 07/04/19 0329  . albuterol (VENTOLIN HFA) 108 (90 Base) MCG/ACT inhaler 1-2 puff  1-2 puff Inhalation Q4H PRN Sharma Covert, MD   2 puff at 07/03/19 1527  . alum & mag hydroxide-simeth (MAALOX/MYLANTA) 200-200-20 MG/5ML suspension 30 mL  30 mL Oral Q4H PRN Mordecai Maes, NP      . ARIPiprazole (ABILIFY) tablet 10 mg  10 mg Oral Daily Derrill Center, NP   5 mg at 07/03/19 2203  . cloNIDine (CATAPRES) tablet 0.1 mg  0.1 mg Oral TID PRN Sharma Covert, MD   0.1 mg at 07/04/19 0313  . fluticasone (FLOVENT HFA) 44 MCG/ACT inhaler 2 puff  2 puff Inhalation BID Sharma Covert, MD   2 puff at 07/03/19 2205  . ipratropium-albuterol (DUONEB) 0.5-2.5 (3) MG/3ML nebulizer solution 3 mL  3 mL Inhalation Q6H PRN Mordecai Maes, NP      . lisinopril (ZESTRIL) tablet 10 mg  10 mg Oral Daily Sharma Covert, MD   10 mg at 07/03/19 2992  . mometasone-formoterol (DULERA) 200-5 MCG/ACT inhaler 2 puff  2 puff Inhalation BID Mordecai Maes, NP   2 puff at 07/03/19 2205  . polyethylene glycol (MIRALAX / GLYCOLAX) packet 17 g  17 g Oral Daily Sharma Covert, MD   17 g at 07/03/19 4268  . QUEtiapine (SEROQUEL) tablet 25 mg  25 mg Oral QHS Derrill Center, NP      . traZODone (DESYREL) tablet 150 mg  150 mg Oral QHS Sharma Covert, MD   150 mg at 07/03/19 2202  . venlafaxine XR (EFFEXOR-XR) 24 hr capsule 225 mg  225 mg Oral Daily Sharma Covert, MD   225 mg at 07/03/19 3419    Lab Results:  Results for orders placed or performed during the hospital encounter of 06/29/19 (from the past 48 hour(s))  Glucose, capillary     Status: Abnormal   Collection Time: 07/02/19 11:57 AM  Result Value Ref Range   Glucose-Capillary 136 (H) 70 - 99 mg/dL  Glucose, capillary     Status: Abnormal   Collection Time: 07/02/19  5:26 PM  Result Value  Ref Range   Glucose-Capillary 113 (H) 70 - 99 mg/dL  Glucose, capillary     Status: Abnormal   Collection Time: 07/02/19  9:18 PM  Result Value Ref Range  Glucose-Capillary 139 (H) 70 - 99 mg/dL   Comment 1 Notify RN    Comment 2 Document in Chart   T4, free     Status: None   Collection Time: 07/03/19  7:53 AM  Result Value Ref Range   Free T4 0.91 0.61 - 1.12 ng/dL    Comment: (NOTE) Biotin ingestion may interfere with free T4 tests. If the results are inconsistent with the TSH level, previous test results, or the clinical presentation, then consider biotin interference. If needed, order repeat testing after stopping biotin. Performed at [facility], [place] [address], [place], [place] [address]   Basic metabolic panel     Status: Abnormal   Collection Time: 07/03/19  7:53 AM  Result Value Ref Range   Sodium 139 135 - 145 mmol/L   Potassium 4.0 3.5 - 5.1 mmol/L   Chloride 106 98 - 111 mmol/L   CO2 24 22 - 32 mmol/L   Glucose, Bld 143 (H) 70 - 99 mg/dL   BUN 11 6 - 20 mg/dL   Creatinine, Ser 0.62 0.44 - 1.00 mg/dL   Calcium 8.8 (L) 8.9 - 10.3 mg/dL   GFR calc non Af Amer >60 >60 mL/min   GFR calc Af Amer >60 >60 mL/min   Anion gap 9 5 - 15    Comment: Performed at [facility], [place] [address] [place], [place] [address]  Glucose, capillary     Status: Abnormal   Collection Time: 07/03/19 12:07 PM  Result Value Ref Range   Glucose-Capillary 102 (H) 70 - 99 mg/dL   Comment 1 Notify RN    Comment 2 Document in Chart   Glucose, capillary     Status: None   Collection Time: 07/03/19  5:05 PM  Result Value Ref Range   Glucose-Capillary 98 70 - 99 mg/dL   Comment 1 Notify RN    Comment 2 Document in Chart   Glucose, capillary     Status: Abnormal   Collection Time: 07/03/19  8:42 PM  Result Value Ref Range   Glucose-Capillary 122 (H) 70 - 99 mg/dL   Comment 1 Notify RN    Comment 2 Document in Chart     Blood Alcohol level:  Lab Results   Component Value Date   ETH <10 06/28/2019   ETH <10 57/12/7791    Metabolic Disorder Labs: Lab Results  Component Value Date   HGBA1C 6.2 (H) 06/30/2019   MPG 131.24 06/30/2019   No results found for: PROLACTIN Lab Results  Component Value Date   CHOL 191 06/30/2019   TRIG 132 06/30/2019   HDL 32 (L) 06/30/2019   CHOLHDL 6.0 06/30/2019   VLDL 26 06/30/2019   LDLCALC 133 (H) 06/30/2019    Physical Findings: AIMS: Facial and Oral Movements Muscles of Facial Expression: None, normal Lips and Perioral Area: None, normal Jaw: None, normal Tongue: None, normal,Extremity Movements Upper (arms, wrists, hands, fingers): None, normal Lower (legs, knees, ankles, toes): None, normal, Trunk Movements Neck, shoulders, hips: None, normal, Overall Severity Severity of abnormal movements (highest score from questions above): None, normal Incapacitation due to abnormal movements: None, normal Patient's awareness of abnormal movements (rate only patient's report): No Awareness, Dental Status Current problems with teeth and/or dentures?: No Does patient usually wear dentures?: No  CIWA:  CIWA-Ar Total: 1 COWS:  COWS Total Score: 2  Musculoskeletal: Strength & Muscle Tone: within normal limits Gait & Station: normal Patient leans: N/A  Psychiatric Specialty Exam: Physical  Exam  Nursing note and vitals reviewed. Constitutional: She is oriented to person, place, and time. She appears well-developed and well-nourished.  HENT:  Head: Normocephalic and atraumatic.  Respiratory: Effort normal.  Neurological: She is alert and oriented to person, place, and time.  Psychiatric: She has a normal mood and affect. Her behavior is normal.    Review of Systems  Psychiatric/Behavioral: Positive for depression and suicidal ideas. The patient is nervous/anxious.   All other systems reviewed and are negative.   Blood pressure 120/75, pulse (!) 109, temperature 98.9 F (37.2 C), temperature  source Oral, resp. rate 18, SpO2 98 %.There is no height or weight on file to calculate BMI.  General Appearance: Casual  Eye Contact:  Fair  Speech:  Normal Rate  Volume:  Normal  Mood:  Anxious  Affect:  Congruent  Thought Process:  Coherent and Descriptions of Associations: Intact  Orientation:  Full (Time, Place, and Person)  Thought Content:  Logical  Suicidal Thoughts:  No  Homicidal Thoughts:  No  Memory:  Immediate;   Fair Recent;   Fair Remote;   Fair  Judgement:  Intact  Insight:  Fair  Psychomotor Activity:  Increased  Concentration:  Concentration: Fair and Attention Span: Fair  Recall:  [employer] of Knowledge:  Fair  Language:  Fair  Akathisia:  Negative  Handed:  Right  AIMS (if indicated):     Assets:  Desire for Improvement Resilience  ADL's:  Intact  Cognition:  WNL  Sleep:  Number of Hours: 3     Treatment Plan Summary: Daily contact with patient to assess and evaluate symptoms and progress in treatment and Medication management   Continue with current treatment plan on 07/04/2019 as listed below except where noted.  Major Depression:   Continue Abilify 10 mg  daily for mood stability as well as augmentation for depression treatment. Discontinued  BuSpar on 07/02/2019 continue  llisinopril to 10 mg p.o. daily for hypertension and renal protection. Continue Catapres 0.1 mg p.o. 3 times daily as needed diastolic blood pressure greater than 100. Discontinue hydroxyzine to 50 mg p.o. every 6 hours as needed anxiety. Initiated-Seroquel 25 mg po nightly  Initiated on trileptal 75mg  po BID - discontinued TSH results:  Free t4= 0.91 Continue  trazodone to 150 mg p.o. nightly for sleep. Continue with Effexor extended release 225 mg p.o. daily for depression and anxiety.  Other medical concerns addressed:  Change blood sugars to daily for diabetes mellitus type 2.  Await results of T4 to make a determination on necessity of treatment of possible  hypothyroidism.- results pending   CSW to continue working on discharge disposition Patient encouraged to participate within the therapeutic milieu  Derrill Center, NP 07/04/2019, 8:02 AM

## 2019-07-05 ENCOUNTER — Inpatient Hospital Stay (HOSPITAL_COMMUNITY)
Admit: 2019-07-05 | Discharge: 2019-07-05 | Disposition: A | Discharge status: Home / Self Care | Payer: Medicare Other | Attending: Psychiatry | Admitting: Psychiatry

## 2019-07-05 LAB — GLUCOSE, CAPILLARY: Glucose-Capillary: 108 mg/dL — ABNORMAL HIGH (ref 70–99)

## 2019-07-05 MED ORDER — OXCARBAZEPINE 150 MG PO TABS
150.0000 mg | ORAL_TABLET | Freq: Two times a day (BID) | ORAL | Status: DC
  Administered 2019-07-05 – 2019-07-07 (×5): 150 mg via ORAL
  Filled 2019-07-05 (×9): qty 1

## 2019-07-05 MED ORDER — QUETIAPINE FUMARATE 100 MG PO TABS
100.0000 mg | ORAL_TABLET | Freq: Every day | ORAL | Status: DC
  Filled 2019-07-05: qty 1

## 2019-07-05 MED ORDER — TRAZODONE HCL 100 MG PO TABS
100.0000 mg | ORAL_TABLET | Freq: Every day | ORAL | Status: DC
  Administered 2019-07-05 – 2019-07-06 (×2): 100 mg via ORAL
  Filled 2019-07-05 (×4): qty 1

## 2019-07-05 MED ORDER — QUETIAPINE FUMARATE 200 MG PO TABS
200.0000 mg | ORAL_TABLET | Freq: Every day | ORAL | Status: DC
  Administered 2019-07-05 – 2019-07-06 (×2): 200 mg via ORAL
  Filled 2019-07-05 (×5): qty 1

## 2019-07-05 NOTE — Progress Notes (Signed)
Patient ID: Jasmine Werner, female   DOB: [birth year], 63 y.o.   MRN: [id number]  Nursing Progress Note [id number]  Patient presents anxious and tangential during interactions. Patient is intrusive and will interrupt Probation officer when she is assisting other patients. Patient requesting a new room because "my roommate likes it too hot". Patient complaining about medication changes but is compliant with scheduled medications. Patient is seen attending groups and visible in the milieu. Patient currently denies SI/HI/AVH.  Patient safety maintained with q15 min safety checks.   Patient remains safe on the unit at this time. Will continue to support and monitor with plan of care.   Patient's self-inventory sheet Rated Energy Level  N/A  Rated Sleep  Good  Rated Appetite  Good  Rated Anxiety (0-10)  0  Rated Hopelessness (0-10)  2  Rated Depression (0-10)  0  Daily Goal  "to have a plan to enter rehab (transportation, family)  Any Additional Comments:  N/A

## 2019-07-05 NOTE — Tx Team (Signed)
Interdisciplinary Treatment and Diagnostic Plan Update  07/05/2019 Time of Session: 9:00am Jasmine Werner MRN: [id number]  Principal Diagnosis: MDD (major depressive disorder)  Secondary Diagnoses: Principal Problem:   MDD (major depressive disorder)   Current Medications:  Current Facility-Administered Medications  Medication Dose Route Frequency Provider Last Rate Last Dose  . acetaminophen (TYLENOL) tablet 650 mg  650 mg Oral Q6H PRN Mordecai Maes, NP   650 mg at 07/05/19 0825  . albuterol (VENTOLIN HFA) 108 (90 Base) MCG/ACT inhaler 1-2 puff  1-2 puff Inhalation Q4H PRN Sharma Covert, MD   2 puff at 07/04/19 [phone]  . alum & mag hydroxide-simeth (MAALOX/MYLANTA) 200-200-20 MG/5ML suspension 30 mL  30 mL Oral Q4H PRN Mordecai Maes, NP      . ARIPiprazole (ABILIFY) tablet 10 mg  10 mg Oral Daily Derrill Center, NP   10 mg at 07/05/19 0754  . cloNIDine (CATAPRES) tablet 0.1 mg  0.1 mg Oral TID PRN Sharma Covert, MD   0.1 mg at 07/04/19 0313  . fluticasone (FLOVENT HFA) 44 MCG/ACT inhaler 2 puff  2 puff Inhalation BID Sharma Covert, MD   2 puff at 07/05/19 0755  . ipratropium-albuterol (DUONEB) 0.5-2.5 (3) MG/3ML nebulizer solution 3 mL  3 mL Inhalation Q6H PRN Mordecai Maes, NP      . lisinopril (ZESTRIL) tablet 10 mg  10 mg Oral Daily Sharma Covert, MD   10 mg at 07/04/19 8850  . LORazepam (ATIVAN) tablet 1 mg  1 mg Oral BID PRN Sharma Covert, MD   1 mg at 07/04/19 2057  . mometasone-formoterol (DULERA) 200-5 MCG/ACT inhaler 2 puff  2 puff Inhalation BID Mordecai Maes, NP   2 puff at 07/05/19 0755  . polyethylene glycol (MIRALAX / GLYCOLAX) packet 17 g  17 g Oral Daily Sharma Covert, MD   17 g at 07/03/19 2774  . QUEtiapine (SEROQUEL) tablet 25 mg  25 mg Oral QHS Derrill Center, NP   25 mg at 07/04/19 2057  . traZODone (DESYREL) tablet 150 mg  150 mg Oral QHS Sharma Covert, MD   150 mg at 07/04/19 2056  . venlafaxine XR (EFFEXOR-XR) 24 hr  capsule 225 mg  225 mg Oral Daily Sharma Covert, MD   225 mg at 07/05/19 1287   PTA Medications: Medications Prior to Admission  Medication Sig Dispense Refill Last Dose  . ARIPiprazole (ABILIFY) 2 MG tablet Take 1 tablet (2 mg total) by mouth daily. 30 tablet 0   . budesonide-formoterol (SYMBICORT) 160-4.5 MCG/ACT inhaler Inhale 2 puffs into the lungs 2 (two) times daily.     Marland Kitchen ipratropium-albuterol (DUONEB) 0.5-2.5 (3) MG/3ML SOLN Inhale 3 mLs into the lungs every 6 (six) hours as needed (Shortness of Breath).   2   . LORazepam (ATIVAN) 1 MG tablet 1-1.5 mg daily as needed for anxiety 45 tablet 1   . polyethylene glycol-electrolytes (TRILYTE) 420 g solution Take 4,000 mLs by mouth as directed. (Patient not taking: Reported on 06/29/2019) 4000 mL 0   . tiZANidine (ZANAFLEX) 4 MG capsule Take 4 mg by mouth daily as needed.      . traZODone (DESYREL) 50 MG tablet 25-50 mg at night as needed for sleep 90 tablet 0   . venlafaxine XR (EFFEXOR-XR) 150 MG 24 hr capsule Take 1 capsule (150 mg total) by mouth daily. 90 capsule 0   . VENTOLIN HFA 108 (90 BASE) MCG/ACT inhaler Inhale 1 puff into the lungs every 6 (six)  hours as needed for shortness of breath.   2     Patient Stressors: Financial difficulties Health problems Medication change or noncompliance Substance abuse  Patient Strengths: Capable of independent living Communication skills  Treatment Modalities: Medication Management, Group therapy, Case management,  1 to 1 session with clinician, Psychoeducation, Recreational therapy.   Physician Treatment Plan for Primary Diagnosis: MDD (major depressive disorder) Long Term Goal(s): Improvement in symptoms so as ready for discharge Improvement in symptoms so as ready for discharge   Short Term Goals: Ability to identify changes in lifestyle to reduce recurrence of condition will improve Ability to verbalize feelings will improve Ability to disclose and discuss suicidal  ideas Ability to demonstrate self-control will improve Ability to identify and develop effective coping behaviors will improve Ability to maintain clinical measurements within normal limits will improve Compliance with prescribed medications will improve Ability to identify triggers associated with substance abuse/mental health issues will improve Ability to identify changes in lifestyle to reduce recurrence of condition will improve Ability to verbalize feelings will improve Ability to disclose and discuss suicidal ideas Ability to demonstrate self-control will improve Ability to identify and develop effective coping behaviors will improve Ability to maintain clinical measurements within normal limits will improve Compliance with prescribed medications will improve Ability to identify triggers associated with substance abuse/mental health issues will improve  Medication Management: Evaluate patient's response, side effects, and tolerance of medication regimen.  Therapeutic Interventions: 1 to 1 sessions, Unit Group sessions and Medication administration.  Evaluation of Outcomes: Progressing  Physician Treatment Plan for Secondary Diagnosis: Principal Problem:   MDD (major depressive disorder)  Long Term Goal(s): Improvement in symptoms so as ready for discharge Improvement in symptoms so as ready for discharge   Short Term Goals: Ability to identify changes in lifestyle to reduce recurrence of condition will improve Ability to verbalize feelings will improve Ability to disclose and discuss suicidal ideas Ability to demonstrate self-control will improve Ability to identify and develop effective coping behaviors will improve Ability to maintain clinical measurements within normal limits will improve Compliance with prescribed medications will improve Ability to identify triggers associated with substance abuse/mental health issues will improve Ability to identify changes in  lifestyle to reduce recurrence of condition will improve Ability to verbalize feelings will improve Ability to disclose and discuss suicidal ideas Ability to demonstrate self-control will improve Ability to identify and develop effective coping behaviors will improve Ability to maintain clinical measurements within normal limits will improve Compliance with prescribed medications will improve Ability to identify triggers associated with substance abuse/mental health issues will improve     Medication Management: Evaluate patient's response, side effects, and tolerance of medication regimen.  Therapeutic Interventions: 1 to 1 sessions, Unit Group sessions and Medication administration.  Evaluation of Outcomes: Progressing   RN Treatment Plan for Primary Diagnosis: MDD (major depressive disorder) Long Term Goal(s): Knowledge of disease and therapeutic regimen to maintain health will improve  Short Term Goals: Ability to remain free from injury will improve, Ability to verbalize frustration and anger appropriately will improve, Ability to identify and develop effective coping behaviors will improve and Compliance with prescribed medications will improve  Medication Management: RN will administer medications as ordered by provider, will assess and evaluate patient's response and provide education to patient for prescribed medication. RN will report any adverse and/or side effects to prescribing provider.  Therapeutic Interventions: 1 on 1 counseling sessions, Psychoeducation, Medication administration, Evaluate responses to treatment, Monitor vital signs and CBGs as ordered, Perform/monitor  CIWA, COWS, AIMS and Fall Risk screenings as ordered, Perform wound care treatments as ordered.  Evaluation of Outcomes: Progressing   LCSW Treatment Plan for Primary Diagnosis: MDD (major depressive disorder) Long Term Goal(s): Safe transition to appropriate next level of care at discharge, Engage  patient in therapeutic group addressing interpersonal concerns.  Short Term Goals: Engage patient in aftercare planning with referrals and resources, Increase emotional regulation, Identify triggers associated with mental health/substance abuse issues and Increase skills for wellness and recovery  Therapeutic Interventions: Assess for all discharge needs, 1 to 1 time with Social worker, Explore available resources and support systems, Assess for adequacy in community support network, Educate family and significant other(s) on suicide prevention, Complete Psychosocial Assessment, Interpersonal group therapy.  Evaluation of Outcomes: Progressing   Progress in Treatment: Attending groups: No. New to unit. Participating in groups: No. Taking medication as prescribed: Yes. Toleration medication: Yes. Family/Significant other contact made: Yes, individual(s) contacted:  daughter, Webb Silversmith Patient understands diagnosis: Yes. Discussing patient identified problems/goals with staff: Yes. Medical problems stabilized or resolved: Yes. Denies suicidal/homicidal ideation: No. Issues/concerns per patient self-inventory: Yes.   New problem(s) identified: Yes, Describe:  home safety concerns, hx of suicide attempts and self harm  New Short Term/Long Term Goal(s): medication management for mood stabilization; elimination of SI thoughts; development of comprehensive mental wellness/sobriety plan.  Patient Goals:  "Become less self destructive."  Discharge Plan or Barriers: Patient has been referred to [facility] in [place] and is waiting for an admissions decision (expected today or tomorrow), otherwise, she may return home and follow up with her psychiatrist at [facility] outpatient in [place].   Reason for Continuation of Hospitalization: Anxiety Depression Suicidal ideation  Estimated Length of Stay: 1-3 days  Attendees: Patient: Jasmine Werner 07/05/2019 8:51 AM  Physician: Queen Blossom 07/05/2019 8:51  AM  Nursing: Glennie Hawk, RN 07/05/2019 8:51 AM  RN Care Manager: 07/05/2019 8:51 AM  Social Worker: Stephanie Acre, Latanya Presser 07/05/2019 8:51 AM  Recreational Therapist:  07/05/2019 8:51 AM  Other:  07/05/2019 8:51 AM  Other:  07/05/2019 8:51 AM  Other: 07/05/2019 8:51 AM    Scribe for Treatment Team: [name], LCSWA 07/05/2019 8:51 AM

## 2019-07-05 NOTE — Progress Notes (Addendum)
CSW attempting to follow up with patient's plan to go to [facility] in [place] [phone], ext [phone].  Intake Coordinator, Caren Griffins, reports that patient's referral will be reviewed today. An admissions decision has not been made at this time.   Update 3:30pm -CSW left a voicemail for intake coordinator asking for admissions decision update.  Stephanie Acre, LCSW-A Clinical Social Worker

## 2019-07-05 NOTE — Plan of Care (Signed)
D: Patient endorses passive SI. Denies HI, AVH, and verbally contracts for safety.    A: Medications administered per MD order. Support provided. Patient educated on safety on the unit and medications. Routine safety checks every 15 minutes. Patient stated understanding to tell nurse about any new physical symptoms. Patient understands to tell staff of any needs.     R: No adverse drug reactions noted. Patient verbally contracts for safety. Patient remains safe at this time and will continue to monitor.   Problem: Education: Goal: Emotional status will improve Outcome: Progressing   [place] NOVEL CORONAVIRUS (COVID-19) DAILY CHECK-OFF SYMPTOMS - answer yes or no to each - every day NO YES  Have you had a fever in the past 24 hours?  Fever (Temp > 37.80C / 100F) X   Have you had any of these symptoms in the past 24 hours? New Cough  Sore Throat   Shortness of Breath  Difficulty Breathing  Unexplained Body Aches   X   Have you had any one of these symptoms in the past 24 hours not related to allergies?   Runny Nose  Nasal Congestion  Sneezing   X   If you have had runny nose, nasal congestion, sneezing in the past 24 hours, has it worsened?  X   EXPOSURES - check yes or no X   Have you traveled outside the state in the past 14 days?  X   Have you been in contact with someone with a confirmed diagnosis of COVID-19 or PUI in the past 14 days without wearing appropriate PPE?  X   Have you been living in the same home as a person with confirmed diagnosis of COVID-19 or a PUI (household contact)?    X   Have you been diagnosed with COVID-19?    X              What to do next: Answered NO to all: Answered YES to anything:   Proceed with unit schedule Follow the [facility] Inpatient Flowsheet.

## 2019-07-05 NOTE — Progress Notes (Signed)
Patient ID: Jasmine Werner, female   DOB: Apr 06, 1956, 63 y.o.   MRN: [id number]  Patient observed up on the phone yelling at her family. Patient had to be redirected by staff to lower her voice. Patient then approached writer requesting a print out of her Ativan order "so I can show my daughter that I'm taking it here". Patient informed she would not be provided a prescription of Ativan upon discharge from the provider here, but patient states she wants her daughter to refill the prescription she already has at home. Patient irritable, impatient and argumentative with Probation officer. Patient states, "I wasn't even asking for an Ativan now, but I think I will take one now. I'm anxious". Patient tearful and shaking. PRN Ativan provided as ordered. Will continue to support and monitor.

## 2019-07-05 NOTE — Progress Notes (Signed)
Patient rated her day as a 10 out of a possible 10 despite the fact that she had an argument earlier in the day. She states that she was merely standing up for herself and that she will be discharged to a rehab facility soon.  Her goal for tomorrow is to "listen more" than she did today Her visitor this evening was Deneise Lever, her daughter.

## 2019-07-05 NOTE — Progress Notes (Signed)
[facility] MD Progress Note  07/05/2019 10:15 AM Jasmine Werner  MRN:  [id number] Subjective:  Patient is a 63 year old female with a past psychiatric history significant for major depression, generalized anxiety, and recent relapse on cocaine and perhaps heroin. She was admitted on 06/30/2019 secondary to suicidal ideation.  Objective: Patient is seen and examined.  Patient is a 63 year old female with the above-stated past psychiatric history who is seen in follow-up.  She stated she feels good today.  She received Ativan last night, and she stated she slept well.  I explained (in detail) why we had not restarted the lorazepam.  I explained to the patient that most likely if she was to get into a rehabilitation facility that they would not allow her to have that medication, and I thought it best for her not to have it continued while she is in the hospital.  She attempted to talk her way into continuing get the medication.  She stated she had a prescription at home, and that her physician was writing it without problem.  I explained the fact (again) that most likely any substance rehabilitation facility would not allow her to have this medicine, and that by restarting it would merely put her in the position to withdrawal or to be turned away.  She had refused the attempt at using Trileptal yesterday.  She stated that previously in the hospital many years ago she had forced medications on her, and she received Haldol and Thorazine and she did not want any medicine like that.  I explained to her that this is a mood stabilizing medicine and that it would help her anxiety, and that would hopefully decrease her need for having to get the lorazepam.  I told her we would continue the lorazepam for now, but on a as needed basis only.  Reportedly she has been accepted to a facility on August 8 or 10.  Her vital signs are stable, she is afebrile.  She does have a cough.  No fever.  She slept 6.75 hours last night.  Her  laboratories from this a.m. showed her blood sugar to be stable at 108.  Repeat metabolic panel on 8/1 was essentially normal with a mildly decreased calcium.  Her TSH is mildly elevated at 6.057, and a T3 and T4 were ordered.  Her T3 and T4 were both essentially normal.  She denied any suicidal ideation.  She denied any auditory or visual hallucinations.  Principal Problem: MDD (major depressive disorder) Diagnosis: Principal Problem:   MDD (major depressive disorder)  Total Time spent with patient: 20 minutes  Past Psychiatric History: See admission H&P  Past Medical History:  Past Medical History:  Diagnosis Date  . Alcohol use   . Allergy   . Anxiety   . Asthma   . Childhood schizophrenia   . Chronic abdominal pain   . Collagen vascular disease ([place])   . COPD (chronic obstructive pulmonary disease) ([place])     Past Surgical History:  Procedure Laterality Date  . ABDOMINAL HYSTERECTOMY    . APPENDECTOMY     Family History:  Family History  Problem Relation Age of Onset  . Asthma Other   . COPD Other   . Alcoholism Other   . Heart attack Other   . Depression Other   . Mental illness Other   . Alcohol abuse Mother   . Colon cancer Mother 69  . Alcohol abuse Father   . Depression Father    Family Psychiatric  History:  See admission H&P Social History:  Social History   Substance and Sexual Activity  Alcohol Use No     Social History   Substance and Sexual Activity  Drug Use Yes  . Types: Marijuana, Cocaine    Social History   Socioeconomic History  . Marital status: Widowed    Spouse name: Not on file  . Number of children: 5  . Years of education: Not on file  . Highest education level: Some college, no degree  Occupational History  . Not on file  Social Needs  . Financial resource strain: Not hard at all  . Food insecurity    Worry: Sometimes true    Inability: Never true  . Transportation needs    Medical: No    Non-medical: No  Tobacco Use  .  Smoking status: Current Every Day Smoker    Packs/day: 0.25    Types: Cigarettes  . Smokeless tobacco: Never Used  Substance and Sexual Activity  . Alcohol use: No  . Drug use: Yes    Types: Marijuana, Cocaine  . Sexual activity: Never  Lifestyle  . Physical activity    Days per week: Not on file    Minutes per session: Not on file  . Stress: Very much  Relationships  . Social Herbalist on phone: Not on file    Gets together: Not on file    Attends religious service: Not on file    Active member of club or organization: Not on file    Attends meetings of clubs or organizations: More than 4 times per year    Relationship status: Widowed  Other Topics Concern  . Not on file  Social History Narrative  . Not on file   Additional Social History:                         Sleep: Good  Appetite:  Fair  Current Medications: Current Facility-Administered Medications  Medication Dose Route Frequency Provider Last Rate Last Dose  . acetaminophen (TYLENOL) tablet 650 mg  650 mg Oral Q6H PRN Mordecai Maes, NP   650 mg at 07/05/19 0825  . albuterol (VENTOLIN HFA) 108 (90 Base) MCG/ACT inhaler 1-2 puff  1-2 puff Inhalation Q4H PRN Sharma Covert, MD   2 puff at 07/04/19 [id number]  . alum & mag hydroxide-simeth (MAALOX/MYLANTA) 200-200-20 MG/5ML suspension 30 mL  30 mL Oral Q4H PRN Mordecai Maes, NP      . ARIPiprazole (ABILIFY) tablet 10 mg  10 mg Oral Daily Derrill Center, NP   10 mg at 07/05/19 0754  . cloNIDine (CATAPRES) tablet 0.1 mg  0.1 mg Oral TID PRN Sharma Covert, MD   0.1 mg at 07/04/19 0313  . fluticasone (FLOVENT HFA) 44 MCG/ACT inhaler 2 puff  2 puff Inhalation BID Sharma Covert, MD   2 puff at 07/05/19 0755  . ipratropium-albuterol (DUONEB) 0.5-2.5 (3) MG/3ML nebulizer solution 3 mL  3 mL Inhalation Q6H PRN Mordecai Maes, NP      . lisinopril (ZESTRIL) tablet 10 mg  10 mg Oral Daily Sharma Covert, MD   10 mg at 07/04/19 7026  .  LORazepam (ATIVAN) tablet 1 mg  1 mg Oral BID PRN Sharma Covert, MD   1 mg at 07/04/19 2057  . mometasone-formoterol (DULERA) 200-5 MCG/ACT inhaler 2 puff  2 puff Inhalation BID Mordecai Maes, NP   2 puff at 07/05/19 0755  . OXcarbazepine (  TRILEPTAL) tablet 150 mg  150 mg Oral BID Sharma Covert, MD      . polyethylene glycol [facility] / GLYCOLAX) packet 17 g  17 g Oral Daily Sharma Covert, MD   17 g at 07/03/19 9381  . QUEtiapine (SEROQUEL) tablet 100 mg  100 mg Oral QHS Sharma Covert, MD      . traZODone (DESYREL) tablet 150 mg  150 mg Oral QHS Sharma Covert, MD   150 mg at 07/04/19 2056  . venlafaxine XR (EFFEXOR-XR) 24 hr capsule 225 mg  225 mg Oral Daily Sharma Covert, MD   225 mg at 07/05/19 8299    Lab Results:  Results for orders placed or performed during the hospital encounter of 06/29/19 (from the past 48 hour(s))  Glucose, capillary     Status: Abnormal   Collection Time: 07/03/19 12:07 PM  Result Value Ref Range   Glucose-Capillary 102 (H) 70 - 99 mg/dL   Comment 1 Notify RN    Comment 2 Document in Chart   Glucose, capillary     Status: None   Collection Time: 07/03/19  5:05 PM  Result Value Ref Range   Glucose-Capillary 98 70 - 99 mg/dL   Comment 1 Notify RN    Comment 2 Document in Chart   Glucose, capillary     Status: Abnormal   Collection Time: 07/03/19  8:42 PM  Result Value Ref Range   Glucose-Capillary 122 (H) 70 - 99 mg/dL   Comment 1 Notify RN    Comment 2 Document in Chart   Glucose, capillary     Status: Abnormal   Collection Time: 07/04/19 12:17 PM  Result Value Ref Range   Glucose-Capillary 109 (H) 70 - 99 mg/dL  Glucose, capillary     Status: Abnormal   Collection Time: 07/05/19  6:38 AM  Result Value Ref Range   Glucose-Capillary 108 (H) 70 - 99 mg/dL    Blood Alcohol level:  Lab Results  Component Value Date   ETH <10 06/28/2019   ETH <10 37/16/9678    Metabolic Disorder Labs: Lab Results  Component Value  Date   HGBA1C 6.2 (H) 06/30/2019   MPG 131.24 06/30/2019   No results found for: PROLACTIN Lab Results  Component Value Date   CHOL 191 06/30/2019   TRIG 132 06/30/2019   HDL 32 (L) 06/30/2019   CHOLHDL 6.0 06/30/2019   VLDL 26 06/30/2019   LDLCALC 133 (H) 06/30/2019    Physical Findings: AIMS: Facial and Oral Movements Muscles of Facial Expression: None, normal Lips and Perioral Area: None, normal Jaw: None, normal Tongue: None, normal,Extremity Movements Upper (arms, wrists, hands, fingers): None, normal Lower (legs, knees, ankles, toes): None, normal, Trunk Movements Neck, shoulders, hips: None, normal, Overall Severity Severity of abnormal movements (highest score from questions above): None, normal Incapacitation due to abnormal movements: None, normal Patient's awareness of abnormal movements (rate only patient's report): No Awareness, Dental Status Current problems with teeth and/or dentures?: No Does patient usually wear dentures?: No  CIWA:  CIWA-Ar Total: 1 COWS:  COWS Total Score: 2  Musculoskeletal: Strength & Muscle Tone: within normal limits Gait & Station: normal Patient leans: N/A  Psychiatric Specialty Exam: Physical Exam  Nursing note and vitals reviewed. Constitutional: She is oriented to person, place, and time. She appears well-developed and well-nourished.  HENT:  Head: Normocephalic and atraumatic.  Respiratory: Effort normal.  Neurological: She is alert and oriented to person, place, and time.  ROS  Blood pressure 128/82, pulse 95, temperature 98.4 F (36.9 C), temperature source Oral, resp. rate 18, SpO2 98 %.There is no height or weight on file to calculate BMI.  General Appearance: Casual  Eye Contact:  Fair  Speech:  Normal Rate  Volume:  Increased  Mood:  Anxious  Affect:  Congruent  Thought Process:  Coherent and Descriptions of Associations: Tangential  Orientation:  Full (Time, Place, and Person)  Thought Content:  Logical   Suicidal Thoughts:  No  Homicidal Thoughts:  No  Memory:  Immediate;   Fair Recent;   Fair Remote;   Fair  Judgement:  Intact  Insight:  Lacking  Psychomotor Activity:  Increased  Concentration:  Concentration: Fair and Attention Span: Fair  Recall:  [employer] of Knowledge:  Fair  Language:  Fair  Akathisia:  Negative  Handed:  Right  AIMS (if indicated):     Assets:  Desire for Improvement Resilience Social Support  ADL's:  Intact  Cognition:  WNL  Sleep:  Number of Hours: 6.75     Treatment Plan Summary: Daily contact with patient to assess and evaluate symptoms and progress in treatment, Medication management and Plan : Patient is seen and examined.  Patient is a 63 year old female with the above-stated past psychiatric history who is seen in follow-up.   Diagnosis: #1 cocaine dependence, #2 major depression, #3 generalized anxiety disorder, #4 COPD, #5 hypertension, #6 diabetes mellitus type 2  Patient is seen in follow-up.  She is very much focused on the Ativan.  I have tried to explain to her that any rehabilitation facility would not allow her to have that, but she has a hard time accepting that.  I will continue the lorazepam at 1 mg p.o. twice daily as needed anxiety.  She has agreed to a trial of Trileptal, and will start that 150 mg p.o. twice daily.  Hopefully that will help her sleep as well.  I will also increase his Seroquel to 200 mg p.o. nightly, and reduce the trazodone to 100 mg p.o. nightly.  No change in her Effexor, Zestril, clonidine or Abilify at this point. 1.  Continue Abilify 10 mg p.o. daily for mood and anxiety. 2.  Continue clonidine 0.1 mg p.o. 3 times daily PRN a diastolic pressure greater than 100. 3.  Continue Flovent 2 puffs twice daily for COPD. 4.  Continue Zestril 10 mg p.o. daily for hypertension. 5.  Continue lorazepam 1 mg p.o. twice daily as needed anxiety. 6.  Continue Dulera 2 puffs inhalation twice daily for COPD. 7.  Start  Trileptal 150 mg p.o. twice daily for anxiety, mood stability. 8.  Increase Seroquel to 200 mg p.o. nightly for mood stability and sleep. 9.  Decrease trazodone to 100 mg p.o. nightly for sleep. 10.  Continue venlafaxine extended release 225 mg p.o. daily for anxiety and depression. 11.  Chest x-ray today for worsening cough. 12.  Disposition planning-in progress.  Sharma Covert, MD 07/05/2019, 10:15 AM

## 2019-07-06 ENCOUNTER — Ambulatory Visit (HOSPITAL_COMMUNITY): Payer: Medicare Other | Admitting: Psychiatry

## 2019-07-06 LAB — GLUCOSE, CAPILLARY: Glucose-Capillary: 93 mg/dL (ref 70–99)

## 2019-07-06 NOTE — Progress Notes (Addendum)
CSW attempting to follow up with patient's plan to go to [facility] in [place] [phone], ext [phone].  CSW has left two voicemails this morning to follow up with referral.   Patient reports she has been accepted to [facility] and has a bed on 08/12 or 08/13.   Stephanie Acre, LCSW-A Clinical Social Worker

## 2019-07-06 NOTE — Progress Notes (Signed)
Bindi observe interacting with her peers in the dayroom. She denies SI/HI/AVH at this time. She states she is waiting for a bed at a long term treatment center in [place]. Pt states the bed won't be available until 07/15/19. Upon d/c, Pt states she will stay with a lady from her church. No other issues noted. Support given and safety maintained.

## 2019-07-06 NOTE — Progress Notes (Addendum)
D:  Patient denied SI and HI, contracts for safety.  Denied A/V hallucinations.  Denied pain. A:  Patient refused lisinopril and miralax this morning, stated she did not need these medications.  Emotional support and encouragement given patient. R:  Safety maintained with 15 minute checks.

## 2019-07-06 NOTE — Progress Notes (Signed)
[facility] MD Progress Note  07/06/2019 9:02 AM Jasmine Werner  MRN:  [id number] Subjective:   Patient seen she is generally pleasant she does not have current thoughts of harming her self she discusses her discharge planning. Mood is generally contained she is conversant, alert and oriented again focused on her discharge plans Principal Problem: MDD (major depressive disorder) Diagnosis: Principal Problem:   MDD (major depressive disorder)  Total Time spent with patient: 20 minutes  Past Psychiatric History: no new data  Past Medical History:  Past Medical History:  Diagnosis Date  . Alcohol use   . Allergy   . Anxiety   . Asthma   . Childhood schizophrenia   . Chronic abdominal pain   . Collagen vascular disease ([place])   . COPD (chronic obstructive pulmonary disease) ([place])     Past Surgical History:  Procedure Laterality Date  . ABDOMINAL HYSTERECTOMY    . APPENDECTOMY     Family History:  Family History  Problem Relation Age of Onset  . Asthma Other   . COPD Other   . Alcoholism Other   . Heart attack Other   . Depression Other   . Mental illness Other   . Alcohol abuse Mother   . Colon cancer Mother 4  . Alcohol abuse Father   . Depression Father    Family Psychiatric  History: no new data Social History:  Social History   Substance and Sexual Activity  Alcohol Use No     Social History   Substance and Sexual Activity  Drug Use Yes  . Types: Marijuana, Cocaine    Social History   Socioeconomic History  . Marital status: Widowed    Spouse name: Not on file  . Number of children: 5  . Years of education: Not on file  . Highest education level: Some college, no degree  Occupational History  . Not on file  Social Needs  . Financial resource strain: Not hard at all  . Food insecurity    Worry: Sometimes true    Inability: Never true  . Transportation needs    Medical: No    Non-medical: No  Tobacco Use  . Smoking status: Current Every Day Smoker   Packs/day: 0.25    Types: Cigarettes  . Smokeless tobacco: Never Used  Substance and Sexual Activity  . Alcohol use: No  . Drug use: Yes    Types: Marijuana, Cocaine  . Sexual activity: Never  Lifestyle  . Physical activity    Days per week: Not on file    Minutes per session: Not on file  . Stress: Very much  Relationships  . Social Herbalist on phone: Not on file    Gets together: Not on file    Attends religious service: Not on file    Active member of club or organization: Not on file    Attends meetings of clubs or organizations: More than 4 times per year    Relationship status: Widowed  Other Topics Concern  . Not on file  Social History Narrative  . Not on file   Additional Social History:                         Sleep: Fair  Appetite:  Fair  Current Medications: Current Facility-Administered Medications  Medication Dose Route Frequency Provider Last Rate Last Dose  . acetaminophen (TYLENOL) tablet 650 mg  650 mg Oral Q6H PRN Mordecai Maes, NP  650 mg at 07/06/19 0546  . albuterol (VENTOLIN HFA) 108 (90 Base) MCG/ACT inhaler 1-2 puff  1-2 puff Inhalation Q4H PRN Sharma Covert, MD   2 puff at 07/06/19 0546  . alum & mag hydroxide-simeth (MAALOX/MYLANTA) 200-200-20 MG/5ML suspension 30 mL  30 mL Oral Q4H PRN Mordecai Maes, NP      . ARIPiprazole (ABILIFY) tablet 10 mg  10 mg Oral Daily Derrill Center, NP   10 mg at 07/06/19 0742  . cloNIDine (CATAPRES) tablet 0.1 mg  0.1 mg Oral TID PRN Sharma Covert, MD   0.1 mg at 07/04/19 0313  . fluticasone (FLOVENT HFA) 44 MCG/ACT inhaler 2 puff  2 puff Inhalation BID Sharma Covert, MD   2 puff at 07/06/19 0741  . ipratropium-albuterol (DUONEB) 0.5-2.5 (3) MG/3ML nebulizer solution 3 mL  3 mL Inhalation Q6H PRN Mordecai Maes, NP      . lisinopril (ZESTRIL) tablet 10 mg  10 mg Oral Daily Sharma Covert, MD   10 mg at 07/04/19 1601  . LORazepam (ATIVAN) tablet 1 mg  1 mg Oral BID  PRN Sharma Covert, MD   1 mg at 07/05/19 1700  . mometasone-formoterol (DULERA) 200-5 MCG/ACT inhaler 2 puff  2 puff Inhalation BID Mordecai Maes, NP   2 puff at 07/06/19 0742  . OXcarbazepine (TRILEPTAL) tablet 150 mg  150 mg Oral BID Sharma Covert, MD   150 mg at 07/06/19 0743  . polyethylene glycol (MIRALAX / GLYCOLAX) packet 17 g  17 g Oral Daily Sharma Covert, MD   17 g at 07/03/19 0932  . QUEtiapine (SEROQUEL) tablet 200 mg  200 mg Oral QHS Sharma Covert, MD   200 mg at 07/05/19 2144  . traZODone (DESYREL) tablet 100 mg  100 mg Oral QHS Sharma Covert, MD   100 mg at 07/05/19 2145  . venlafaxine XR (EFFEXOR-XR) 24 hr capsule 225 mg  225 mg Oral Daily Sharma Covert, MD   225 mg at 07/06/19 3557    Lab Results:  Results for orders placed or performed during the hospital encounter of 06/29/19 (from the past 48 hour(s))  Glucose, capillary     Status: Abnormal   Collection Time: 07/04/19 12:17 PM  Result Value Ref Range   Glucose-Capillary 109 (H) 70 - 99 mg/dL  Glucose, capillary     Status: Abnormal   Collection Time: 07/05/19  6:38 AM  Result Value Ref Range   Glucose-Capillary 108 (H) 70 - 99 mg/dL  Glucose, capillary     Status: None   Collection Time: 07/06/19  5:43 AM  Result Value Ref Range   Glucose-Capillary 93 70 - 99 mg/dL    Blood Alcohol level:  Lab Results  Component Value Date   ETH <10 06/28/2019   ETH <10 32/20/2542    Metabolic Disorder Labs: Lab Results  Component Value Date   HGBA1C 6.2 (H) 06/30/2019   MPG 131.24 06/30/2019   No results found for: PROLACTIN Lab Results  Component Value Date   CHOL 191 06/30/2019   TRIG 132 06/30/2019   HDL 32 (L) 06/30/2019   CHOLHDL 6.0 06/30/2019   VLDL 26 06/30/2019   LDLCALC 133 (H) 06/30/2019    Physical Findings: AIMS: Facial and Oral Movements Muscles of Facial Expression: None, normal Lips and Perioral Area: None, normal Jaw: None, normal Tongue: None,  normal,Extremity Movements Upper (arms, wrists, hands, fingers): None, normal Lower (legs, knees, ankles, toes): None, normal, Trunk Movements  Neck, shoulders, hips: None, normal, Overall Severity Severity of abnormal movements (highest score from questions above): None, normal Incapacitation due to abnormal movements: None, normal Patient's awareness of abnormal movements (rate only patient's report): No Awareness, Dental Status Current problems with teeth and/or dentures?: No Does patient usually wear dentures?: No  CIWA:  CIWA-Ar Total: 1 COWS:  COWS Total Score: 2  Musculoskeletal: Strength & Muscle Tone: within normal limits Gait & Station: normal Patient leans: N/A  Psychiatric Specialty Exam: Physical Exam  ROS  Blood pressure (!) 115/95, pulse (!) 105, temperature 98 F (36.7 C), temperature source Oral, resp. rate 18, SpO2 91 %.There is no height or weight on file to calculate BMI.  General Appearance: Guarded  Eye Contact:  Fair  Speech:  Clear and Coherent  Volume:  Normal  Mood:  Euthymic  Affect:  Congruent  Thought Process:  Goal Directed and Descriptions of Associations: Circumstantial  Orientation:  Full (Time, Place, and Person)  Thought Content:  Logical  Suicidal Thoughts:  No  Homicidal Thoughts:  No  Memory:  Immediate;   Fair  Judgement:  Fair  Insight:  Fair  Psychomotor Activity:  Normal  Concentration:  Concentration: Fair  Recall:  [employer] of Knowledge:  Fair  Language:  Fair  Akathisia:  Negative  Handed:  Right  AIMS (if indicated):     Assets:  Physical Health Resilience  ADL's:  Intact  Cognition:  WNL  Sleep:  Number of Hours: 6.5     Treatment Plan Summary: Daily contact with patient to assess and evaluate symptoms and progress in treatment, Medication management and Plan Continue current antidepressant therapy with augmentation strategies to include aripiprazole/quetiapine, continue antihypertensives continue mood stabilizer  therapy continue cognitive therapy probable discharge tomorrow or within 48 hours  Shantia Sanford, MD 07/06/2019, 9:02 AM

## 2019-07-06 NOTE — Progress Notes (Signed)
Adult Psychoeducational Group Note  Date:  07/06/2019 Time:  9:05 PM  Group Topic/Focus:  Wrap-Up Group:   The focus of this group is to help patients review their daily goal of treatment and discuss progress on daily workbooks.  Participation Level:  Active  Participation Quality:  Appropriate  Affect:  Appropriate  Cognitive:  Appropriate  Insight: Appropriate  Engagement in Group:  Engaged  Modes of Intervention:  Discussion  Additional Comments:  Patient attended group and said that her day was a 7.  Her goals for today were to be kind, listen and be total honest with herself. Patient said she accomplished her goals.    Tomasina Keasling W Tenessa Marsee 0/03/7532, 9:05 PM

## 2019-07-06 NOTE — Progress Notes (Signed)
[place] NOVEL CORONAVIRUS (COVID-19) DAILY CHECK-OFF SYMPTOMS - answer yes or no to each - every day NO YES  Have you had a fever in the past 24 hours?  . Fever (Temp > 37.80C / 100F) X   Have you had any of these symptoms in the past 24 hours? . New Cough .  Sore Throat  .  Shortness of Breath .  Difficulty Breathing .  Unexplained Body Aches   X   Have you had any one of these symptoms in the past 24 hours not related to allergies?   . Runny Nose .  Nasal Congestion .  Sneezing   X   If you have had runny nose, nasal congestion, sneezing in the past 24 hours, has it worsened?  X   EXPOSURES - check yes or no X   Have you traveled outside the state in the past 14 days?  X   Have you been in contact with someone with a confirmed diagnosis of COVID-19 or PUI in the past 14 days without wearing appropriate PPE?  X   Have you been living in the same home as a person with confirmed diagnosis of COVID-19 or a PUI (household contact)?    X   Have you been diagnosed with COVID-19?    X              What to do next: Answered NO to all: Answered YES to anything:   Proceed with unit schedule Follow the [facility] Inpatient Flowsheet.   

## 2019-07-06 NOTE — Progress Notes (Signed)
CSW spoke with intake coordinator at [facility], [place]. Intake coordinator confirms that the earliest the patient would be able to enter their program is 08/13. CSW has scheduled an appointment with patient's outpatient psychiatrist for 08/12  Stephanie Acre, [place] Social Worker

## 2019-07-06 NOTE — Plan of Care (Signed)
Nurse discussed anxiety, depression, coping skills with patient. 

## 2019-07-07 LAB — GLUCOSE, CAPILLARY: Glucose-Capillary: 99 mg/dL (ref 70–99)

## 2019-07-07 MED ORDER — ARIPIPRAZOLE 10 MG PO TABS: 10.0000 mg | ORAL_TABLET | Freq: Every day | ORAL | 0 refills | Status: DC

## 2019-07-07 MED ORDER — VENLAFAXINE HCL ER 75 MG PO CP24: 225.0000 mg | ORAL_CAPSULE | Freq: Every day | ORAL | 2 refills | Status: DC

## 2019-07-07 MED ORDER — VENLAFAXINE HCL ER 75 MG PO CP24: 225.0000 mg | ORAL_CAPSULE | Freq: Every day | ORAL | 0 refills | Status: DC

## 2019-07-07 MED ORDER — OXCARBAZEPINE 150 MG PO TABS: 150.0000 mg | ORAL_TABLET | Freq: Two times a day (BID) | ORAL | 2 refills | Status: DC

## 2019-07-07 MED ORDER — QUETIAPINE FUMARATE 200 MG PO TABS: 200.0000 mg | ORAL_TABLET | Freq: Every day | ORAL | 0 refills | Status: DC

## 2019-07-07 MED ORDER — OXCARBAZEPINE 150 MG PO TABS: 150.0000 mg | ORAL_TABLET | Freq: Two times a day (BID) | ORAL | 0 refills | Status: DC

## 2019-07-07 MED ORDER — LISINOPRIL 10 MG PO TABS: 10.0000 mg | ORAL_TABLET | Freq: Every day | ORAL | 0 refills | Status: AC

## 2019-07-07 MED ORDER — TRAZODONE HCL 100 MG PO TABS: 100.0000 mg | ORAL_TABLET | Freq: Every evening | ORAL | 1 refills | Status: DC | PRN

## 2019-07-07 MED ORDER — LISINOPRIL 10 MG PO TABS: 10.0000 mg | ORAL_TABLET | Freq: Every day | ORAL | 1 refills | Status: DC

## 2019-07-07 MED ORDER — QUETIAPINE FUMARATE 200 MG PO TABS: 200.0000 mg | ORAL_TABLET | Freq: Every day | ORAL | 1 refills | Status: DC

## 2019-07-07 MED ORDER — ARIPIPRAZOLE 10 MG PO TABS: 10.0000 mg | ORAL_TABLET | Freq: Every day | ORAL | 2 refills | Status: DC

## 2019-07-07 MED ORDER — TRAZODONE HCL 100 MG PO TABS: 100.0000 mg | ORAL_TABLET | Freq: Every evening | ORAL | 0 refills | Status: DC | PRN

## 2019-07-07 NOTE — Progress Notes (Signed)
Patient ID: Jasmine Werner, female   DOB: 1956/10/15, 63 y.o.   MRN: [id number] Pt d/c to home with daughter. D/c instructions and medications reviewed. rx's given. Pt verbalizes understanding. Denies s.i.

## 2019-07-07 NOTE — [facility] Suicide Risk Assessment (Signed)
[facility] Discharge Suicide Risk Assessment   Principal Problem: MDD (major depressive disorder) Discharge Diagnoses: Principal Problem:   MDD (major depressive disorder)   Total Time spent with patient: 45 minutes  Musculoskeletal: Strength & Muscle Tone: within normal limits Gait & Station: normal Patient leans: N/A  Psychiatric Specialty Exam: ROS  Blood pressure 106/82, pulse (!) 106, temperature (!) 97.5 F (36.4 C), temperature source Oral, resp. rate 18, SpO2 95 %.There is no height or weight on file to calculate BMI.  General Appearance: Casual  Eye Contact::  Good  Speech:  Clear and Coherent409  Volume:  Normal  Mood:  Euthymic  Affect:  Appropriate and Congruent  Thought Process:  Coherent and Descriptions of Associations: Tangential  Orientation:  Full (Time, Place, and Person)  Thought Content:  Logical  Suicidal Thoughts:  No  Homicidal Thoughts:  No  Memory:  Recent;   Good  Judgement:  Fair  Insight:  Fair  Psychomotor Activity:  Normal  Concentration:  Good  Recall:  Good  Fund of Knowledge:Good  Language: Good  Akathisia:  Negative  Handed:  Right  AIMS (if indicated):     Assets:  Communication Skills Desire for Improvement  Sleep:  Number of Hours: 5  Cognition: WNL  ADL's:  Intact   Mental Status Per Nursing Assessment::   On Admission:  Suicidal ideation indicated by patient  Demographic Factors:  Caucasian  Loss Factors: Decrease in vocational status  Historical Factors: NA  Risk Reduction Factors:   Sense of responsibility to family, Religious beliefs about death, Positive social support and Positive therapeutic relationship  Continued Clinical Symptoms:  stable  Cognitive Features That Contribute To Risk:  None    Suicide Risk:  Minimal: No identifiable suicidal ideation.  Patients presenting with no risk factors but with morbid ruminations; may be classified as minimal risk based on the severity of the depressive  symptoms  Follow-up Information    [facility] Follow up.   Contact information: [address] [place], [place] [address] [phone]       [facility]. Call on 07/14/2019.   Specialty: Behavioral Health Why: Your medication management appointment is scheduled for 07/14/2019 at 8:20am. The provider will call you.  Contact information: [address] [place] [phone]          Plan Of Care/Follow-up recommendations:  Activity:  full  Jase Reep, MD 07/07/2019, 8:45 AM

## 2019-07-07 NOTE — Progress Notes (Signed)
  [facility] Adult Case Management Discharge Plan :  Will you be returning to the same living situation after discharge:  No. Going to stay with a church friend. At discharge, do you have transportation home?: Yes,  daughter, Deneise Lever, will pick up around 12:00pm. Do you have the ability to pay for your medications: Yes,  yes, Medicare.  Release of information consent forms completed and in the chart.  Patient to Follow up at: Follow-up Information    [facility]. Call on 07/08/2019.   Why: Your referral is processing. Please call to follow up and speak with Forestine Na, intake coordinator at [phone] Ext. [phone]. Contact information: [address] [place], [place] [address] [phone]       [facility]. Call on 07/14/2019.   Specialty: Behavioral Health Why: Your medication management appointment is scheduled for 07/14/2019 at 8:20am. The provider will call you.  Contact information: [address] [place] [phone]          Next level of care provider has access to [place] and Suicide Prevention discussed: Yes,  with daughter.    Has patient been referred to the Quitline?: N/A patient is not a smoker  Patient has been referred for addiction treatment: Yes  [name], [place] 07/07/2019, 8:56 AM

## 2019-07-07 NOTE — Discharge Summary (Signed)
Physician Discharge Summary Note  Patient:  Jasmine Werner is an 63 y.o., female MRN:  [id number] DOB:  Mar 26, 1956 Patient phone:  [phone] (home)  Patient address:   [address] [place] [address],  Total Time spent with patient: Greater than 30 minutes  Date of Admission:  06/29/2019 Date of Discharge: 07-07-19  Reason for Admission: Reports indicated patient had plan to overdose on drugs or jump into the river and drown.   Principal Problem: MDD (major depressive disorder)  Discharge Diagnoses: Principal Problem:   MDD (major depressive disorder)  Past Psychiatric History: Major depressive disorder.  Past Medical History:  Past Medical History:  Diagnosis Date  . Alcohol use   . Allergy   . Anxiety   . Asthma   . Childhood schizophrenia   . Chronic abdominal pain   . Collagen vascular disease ([place])   . COPD (chronic obstructive pulmonary disease) ([place])     Past Surgical History:  Procedure Laterality Date  . ABDOMINAL HYSTERECTOMY    . APPENDECTOMY     Family History:  Family History  Problem Relation Age of Onset  . Asthma Other   . COPD Other   . Alcoholism Other   . Heart attack Other   . Depression Other   . Mental illness Other   . Alcohol abuse Mother   . Colon cancer Mother 23  . Alcohol abuse Father   . Depression Father    Family Psychiatric  History: See H&P Social History:  Social History   Substance and Sexual Activity  Alcohol Use No     Social History   Substance and Sexual Activity  Drug Use Yes  . Types: Marijuana, Cocaine    Social History   Socioeconomic History  . Marital status: Widowed    Spouse name: Not on file  . Number of children: 5  . Years of education: Not on file  . Highest education level: Some college, no degree  Occupational History  . Not on file  Social Needs  . Financial resource strain: Not hard at all  . Food insecurity    Worry: Sometimes true    Inability: Never true  . Transportation needs     Medical: No    Non-medical: No  Tobacco Use  . Smoking status: Current Every Day Smoker    Packs/day: 0.25    Types: Cigarettes  . Smokeless tobacco: Never Used  Substance and Sexual Activity  . Alcohol use: No  . Drug use: Yes    Types: Marijuana, Cocaine  . Sexual activity: Never  Lifestyle  . Physical activity    Days per week: Not on file    Minutes per session: Not on file  . Stress: Very much  Relationships  . Social Herbalist on phone: Not on file    Gets together: Not on file    Attends religious service: Not on file    Active member of club or organization: Not on file    Attends meetings of clubs or organizations: More than 4 times per year    Relationship status: Widowed  Other Topics Concern  . Not on file  Social History Narrative  . Not on file   Hospital Course: (Per Md's admission evaluation): Patient is a 63 year old female with a past psychiatric history significant for anxiety, depression and recent relapse on cocaine or heroin who presented to the [facility] emergency department on 06/28/2019 with suicidal ideation. In the emergency room notes the patient  stated she had plan to overdose on drugs or jump into the river and drown. At that time she stated "I do not see any way out, I am just trying to get to heaven". She is disorganized this morning, and significantly anxious. 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 She had recently relapsed approximately 2 months ago. She has been followed psychiatrically at [facility] in [place], but missed her last 2 appointments. She did admit to using marijuana on basically a daily basis. Her last visit per psychiatry was on 05/27/2019. It was done by tele-visit. The note reflected that the patient had recently started taking Abilify prior to this visit. She was very somatic and was concerned about the possibility of pancreatic  cancer. It was noted she was significantly anxious, but reported that she was sleeping 12 hours a day. Her list of medications at that time included Abilify 2 mg p.o. daily, lorazepam 1 to 1.5 mg p.o. daily as needed anxiety, Zanaflex 4 mg as needed, trazodone 50 mg p.o. nightly and venlafaxine extended release 150 mg p.o. daily. Her compliance with medication was not clear. Her CT scan of the abdomen did not show any evidence of cancer, but did show fatty liver. The patient also had a HIDA scan done which showed normal ejection fraction of the gallbladder. She was having nausea and there was no explanation for that. She recently had a chest x-ray also on 05/14/2019 that showed no active cardiopulmonary disease. She is very anxious and not a great historian at this time. She was admitted to the hospital for evaluation and stabilization.  After evaluation of of the reason for this admission, Jasmine Werner was started on the medication regimen for her presenting symptoms. She received, stabilized & was discharged on the medications as listed below. She was enrolled & participated in the group counseling sessions being offered & held on this unit. She learned coping skills that should help her cope better after discharge. She presented other significant pre-existing medical issues that required treatment & or monitoring. She was resumed/discharged on those medications. She tolerated her treatment regimen without any adverse effects or reactions reported. Jasmine Werner's symptoms responded well to her treatment regimen. This is evidenced by her reports of improved mood, symptoms & presentation of good affect. She presents stable for discharge today.   Today upon this discharge evaluation, pt shares, "I'm feeling better" She denies any specific concerns. She is sleeping well. Her appetite is good. She denies any other physical complaints. She denies SI/HI/AH/VH. She is tolerating her medications well, and she is in agreement  to continue his current regimen without changes. She is in agreement to have to follow up on an outpatient basis as noted below. She was able to engage in safety planning including plan to return to [facility] or contact emergency services if he feels unable to maintain his own safety or the safety of others. Pt had no further questions, comments, or concerns.   As noted above, [place] is currently being discharged on two separate anti-psychotic medications. This is because his symptoms has not been able to respond appropriately to an antipsychotic monotherapy. However, the combination of Abilify & Seroquel has helped improve her psychotic symptoms. It will benefit patient to continue on these combination antipsychotic therapies as recommended. However, as her symptoms continue to improve, patient may be then titrated down to an antipsychotic monotherapy. This is to prevent the development of  metabolic syndrome & other related adverse effects associated with use of multiple antipsychotic therapies.  [facility] with all personal belongings no apparent distress. Transportation per daughter.   Physical Findings: AIMS: Facial and Oral Movements Muscles of Facial Expression: None, normal Lips and Perioral Area: None, normal Jaw: None, normal Tongue: None, normal,Extremity Movements Upper (arms, wrists, hands, fingers): None, normal Lower (legs, knees, ankles, toes): None, normal, Trunk Movements Neck, shoulders, hips: None, normal, Overall Severity Severity of abnormal movements (highest score from questions above): None, normal Incapacitation due to abnormal movements: None, normal Patient's awareness of abnormal movements (rate only patient's report): No Awareness, Dental Status Current problems with teeth and/or dentures?: No Does patient usually wear dentures?: No  CIWA:  CIWA-Ar Total: 1 COWS:  COWS Total Score: 3  Musculoskeletal: Strength & Muscle Tone: within normal limits Gait & Station:  normal Patient leans: N/A  Psychiatric Specialty Exam: Physical Exam  Nursing note and vitals reviewed. Constitutional: She is oriented to person, place, and time. She appears well-developed.  HENT:  Head: Normocephalic.  Eyes: Pupils are equal, round, and reactive to light.  Neck: Normal range of motion.  Cardiovascular: Normal rate.  Respiratory: Effort normal.  GI: Soft.  Genitourinary:    Genitourinary Comments: Deferred   Musculoskeletal: Normal range of motion.  Neurological: She is alert and oriented to person, place, and time.  Skin: Skin is warm.    Review of Systems  Constitutional: Negative for chills and fever.  HENT: Negative.   Eyes: Negative.   Respiratory: Negative for cough, shortness of breath and wheezing.   Cardiovascular: Negative for chest pain, palpitations and leg swelling.  Gastrointestinal: Negative for heartburn, nausea and vomiting.  Genitourinary: Negative.   Musculoskeletal: Negative.   Skin: Negative.   Neurological: Negative for dizziness, seizures and headaches.  Endo/Heme/Allergies: Negative.   Psychiatric/Behavioral: Positive for depression (Stabilized with medication prior to discharge) and substance abuse (Hx. Cocaine & THC use disorders). Negative for hallucinations, memory loss and suicidal ideas. The patient has insomnia (Stabilized with medication prior to discharge). The patient is not nervous/anxious (Stable).     Blood pressure 106/82, pulse (!) 106, temperature (!) 97.5 F (36.4 C), temperature source Oral, resp. rate 18, SpO2 95 %.There is no height or weight on file to calculate BMI.  See Md's discharge SRA    Has this patient used any form of tobacco in the last 30 days? (Cigarettes, Smokeless Tobacco, Cigars, and/or Pipes): N/A  Blood Alcohol level:  Lab Results  Component Value Date   ETH <10 06/28/2019   ETH <10 09/62/8366   Metabolic Disorder Labs:  Lab Results  Component Value Date   HGBA1C 6.2 (H) 06/30/2019   MPG  131.24 06/30/2019   No results found for: PROLACTIN Lab Results  Component Value Date   CHOL 191 06/30/2019   TRIG 132 06/30/2019   HDL 32 (L) 06/30/2019   CHOLHDL 6.0 06/30/2019   VLDL 26 06/30/2019   LDLCALC 133 (H) 06/30/2019   See Psychiatric Specialty Exam and Suicide Risk Assessment completed by Attending Physician prior to discharge.  Discharge destination:  Home  Is patient on multiple antipsychotic therapies at discharge:  Yes,   Do you recommend tapering to monotherapy for antipsychotics?  Yes   Has Patient had three or more failed trials of antipsychotic monotherapy by history: Unsure  Recommended Plan for Multiple Antipsychotic Therapies: Additional reason(s) for multiple antispychotic treatment:  Patient's symptoms has failed to respond adequately to an antipsychotic Monotherapy.  Allergies as  of 07/07/2019      Reactions   Tramadol Nausea And Vomiting      Medication List    STOP taking these medications   tiZANidine 4 MG capsule Commonly known as: ZANAFLEX     TAKE these medications     Indication  ARIPiprazole 10 MG tablet Commonly known as: ABILIFY Take 1 tablet (10 mg total) by mouth daily. Start taking on: July 08, 2019 What changed:   medication strength  how much to take  Indication: MIXED BIPOLAR AFFECTIVE DISORDER   budesonide-formoterol 160-4.5 MCG/ACT inhaler Commonly known as: SYMBICORT Inhale 2 puffs into the lungs 2 (two) times daily.  Indication: Acute Asthma   ipratropium-albuterol 0.5-2.5 (3) MG/3ML Soln Commonly known as: DUONEB Inhale 3 mLs into the lungs every 6 (six) hours as needed (Shortness of Breath).  Indication: Chronic Obstructive Lung Disease   lisinopril 10 MG tablet Commonly known as: ZESTRIL Take 1 tablet (10 mg total) by mouth daily. Start taking on: July 08, 2019  Indication: High Blood Pressure Disorder   LORazepam 1 MG tablet Commonly known as: ATIVAN 1-1.5 mg daily as needed for anxiety  Indication:  Feeling Anxious   OXcarbazepine 150 MG tablet Commonly known as: TRILEPTAL Take 1 tablet (150 mg total) by mouth 2 (two) times daily.  Indication: Diabetes with Nerve Disease   polyethylene glycol-electrolytes 420 g solution Commonly known as: TriLyte Take 4,000 mLs by mouth as directed.  Indication: Evacuation of Material from the Bowel   QUEtiapine 200 MG tablet Commonly known as: SEROQUEL Take 1 tablet (200 mg total) by mouth at bedtime.  Indication: Depressive Phase of Manic-Depression   traZODone 100 MG tablet Commonly known as: DESYREL Take 1 tablet (100 mg total) by mouth at bedtime as needed for sleep. What changed:   medication strength  how much to take  how to take this  when to take this  reasons to take this  additional instructions  Indication: Anxiety Disorder   venlafaxine XR 75 MG 24 hr capsule Commonly known as: EFFEXOR-XR Take 3 capsules (225 mg total) by mouth daily. Start taking on: July 08, 2019 What changed:   medication strength  how much to take  Indication: Major Depressive Disorder   Ventolin HFA 108 (90 Base) MCG/ACT inhaler Generic drug: albuterol Inhale 1 puff into the lungs every 6 (six) hours as needed for shortness of breath.  Indication: Chronic Obstructive Lung Disease      Follow-up Information    [facility]. Call on 07/08/2019.   Why: Your referral is processing. Please call to follow up and speak with Forestine Na, intake coordinator at [phone] Ext. [phone]. Contact information: [address], [place], [place] [address] [phone]       [facility]. Call on 07/14/2019.   Specialty: Behavioral Health Why: Your medication management appointment is scheduled for 07/14/2019 at 8:20am. The provider will call you.  Contact information: [address] [place] [phone]         Follow-up recommendations: Activity:  As  tolerated Diet: As recommended by your primary care doctor. Keep all scheduled follow-up appointments as recommended.    Comments: Patient is instructed prior to discharge to: Take all medications as prescribed by his/her mental healthcare provider. Report any adverse effects and or reactions from the medicines to his/her outpatient provider promptly. Patient has been instructed & cautioned: To not engage in alcohol and or illegal drug use while on prescription medicines. In the  event of worsening symptoms, patient is instructed to call the crisis hotline, 911 and or go to the nearest ED for appropriate evaluation and treatment of symptoms. To follow-up with his/her primary care provider for your other medical issues, concerns and or health care needs.   Signed: Lindell Spar, NP, PMHNP, FNP-BC 07/07/2019, 9:16 AM

## 2019-07-12 ENCOUNTER — Encounter (HOSPITAL_COMMUNITY): Payer: Self-pay | Admitting: Psychiatry

## 2019-07-12 DIAGNOSIS — F331 Major depressive disorder, recurrent, moderate: Secondary | ICD-10-CM | POA: Diagnosis not present

## 2019-07-12 NOTE — Progress Notes (Signed)
Virtual Visit via Video Note  I connected with Jasmine Werner on 07/14/19 at  8:20 AM EDT by a video enabled telemedicine application and verified that I am speaking with the correct person using two identifiers.   I discussed the limitations of evaluation and management by telemedicine and the availability of in person appointments. The patient expressed understanding and agreed to proceed.    I discussed the assessment and treatment plan with the patient. The patient was provided an opportunity to ask questions and all were answered. The patient agreed with the plan and demonstrated an understanding of the instructions.   The patient was advised to call back or seek an in-person evaluation if the symptoms worsen or if the condition fails to improve as anticipated.  I provided 25 minutes of non-face-to-face time during this encounter.   [name], MD     [facility] MD/PA/NP OP Progress Note  07/14/2019 8:58 AM Jasmine Werner  MRN:  [id number]  Chief Complaint:  Chief Complaint    Depression; Follow-up     HPI:  - The patient was admitted to [facility] in August for SI in the context of cocaine use. She is started on quetiapine in addition to Abilify during the admission. She was also started on Trileptal during the admission.   This is a follow-up appointment for depression and substance use.  She states that she has been feeling much better, and stronger since she was discharged from the hospital. She reports that she had "six weeks of darkest time" preceding to the admission. She states that she found out that her mother adopted two children before she was born, which she found out from [email or web address]. She also had family funeral, where people were drinking and using marijuana.  Although she did not drink alcohol, she has started to use marijuana at that time.  She felt frozen, isolated, and was paranoid, stating that she felt like she was hurting somebody.  She went to the street and bought  drug, although she did not know what she was using.  She has then started to use marijuana every day, and cocaine for four weeks. She learned from this experience. She will go to [facility] on 17th.  Although she feels apprehensive about this, she also feels excited to do something for herself. She has fair sleep.  She feels less depressed.  She has fair concentration.  She has good motivation.  She denies SI, HI, paranoia or hallucinations.  She feels anxious at times.  She denies panic attacks.  She has not used Ativan as she ran out since she was discharged from the hospital.   Visit Diagnosis:    ICD-10-CM   1. Substance use disorder  F19.90   2. MDD (major depressive disorder), recurrent episode, mild ([place])  F33.0     Past Psychiatric History: Please see initial evaluation for full details. I have reviewed the history. No updates at this time.     Past Medical History:  Past Medical History:  Diagnosis Date  . Alcohol use   . Allergy   . Anxiety   . Asthma   . Childhood schizophrenia   . Chronic abdominal pain   . Collagen vascular disease ([place])   . COPD (chronic obstructive pulmonary disease) ([place])     Past Surgical History:  Procedure Laterality Date  . ABDOMINAL HYSTERECTOMY    . APPENDECTOMY      Family Psychiatric History: Please see initial evaluation for full details. I have reviewed the history.  No updates at this time.     Family History:  Family History  Problem Relation Age of Onset  . Asthma Other   . COPD Other   . Alcoholism Other   . Heart attack Other   . Depression Other   . Mental illness Other   . Alcohol abuse Mother   . Colon cancer Mother 34  . Alcohol abuse Father   . Depression Father     Social History:  Social History   Socioeconomic History  . Marital status: Widowed    Spouse name: Not on file  . Number of children: 5  . Years of education: Not on file  . Highest education level: Some college, no degree  Occupational History   . Not on file  Social Needs  . Financial resource strain: Not hard at all  . Food insecurity    Worry: Sometimes true    Inability: Never true  . Transportation needs    Medical: No    Non-medical: No  Tobacco Use  . Smoking status: Current Every Day Smoker    Packs/day: 0.25    Types: Cigarettes  . Smokeless tobacco: Never Used  Substance and Sexual Activity  . Alcohol use: No  . Drug use: Yes    Types: Marijuana, Cocaine  . Sexual activity: Never  Lifestyle  . Physical activity    Days per week: Not on file    Minutes per session: Not on file  . Stress: Very much  Relationships  . Social Herbalist on phone: Not on file    Gets together: Not on file    Attends religious service: Not on file    Active member of club or organization: Not on file    Attends meetings of clubs or organizations: More than 4 times per year    Relationship status: Widowed  Other Topics Concern  . Not on file  Social History Narrative  . Not on file    Allergies:  Allergies  Allergen Reactions  . Tramadol Nausea And Vomiting    Metabolic Disorder Labs: Lab Results  Component Value Date   HGBA1C 6.2 (H) 06/30/2019   MPG 131.24 06/30/2019   No results found for: PROLACTIN Lab Results  Component Value Date   CHOL 191 06/30/2019   TRIG 132 06/30/2019   HDL 32 (L) 06/30/2019   CHOLHDL 6.0 06/30/2019   VLDL 26 06/30/2019   LDLCALC 133 (H) 06/30/2019   Lab Results  Component Value Date   TSH 6.057 (H) 06/30/2019    Therapeutic Level Labs: No results found for: LITHIUM No results found for: VALPROATE No components found for:  CBMZ  Current Medications: Current Outpatient Medications  Medication Sig Dispense Refill  . polyethylene glycol-electrolytes (TRILYTE) 420 g solution Take 4,000 mLs by mouth as directed. 4000 mL 0  . [START ON 08/07/2019] ARIPiprazole (ABILIFY) 5 MG tablet Take 1 tablet (5 mg total) by mouth daily. 30 tablet 1  . budesonide-formoterol  (SYMBICORT) 160-4.5 MCG/ACT inhaler Inhale 2 puffs into the lungs 2 (two) times daily.    Marland Kitchen ipratropium-albuterol (DUONEB) 0.5-2.5 (3) MG/3ML SOLN Inhale 3 mLs into the lungs every 6 (six) hours as needed (Shortness of Breath).   2  . lisinopril (ZESTRIL) 10 MG tablet Take 1 tablet (10 mg total) by mouth daily. 30 tablet 0  . [START ON 08/07/2019] OXcarbazepine (TRILEPTAL) 150 MG tablet Take 1 tablet (150 mg total) by mouth 2 (two) times daily. 60 tablet 1  . [  START ON 08/07/2019] QUEtiapine (SEROQUEL) 200 MG tablet Take 1 tablet (200 mg total) by mouth at bedtime. 30 tablet 1  . [START ON 08/07/2019] traZODone (DESYREL) 100 MG tablet Take 1 tablet (100 mg total) by mouth at bedtime as needed for sleep. 30 tablet 1  . [START ON 08/07/2019] venlafaxine XR (EFFEXOR-XR) 75 MG 24 hr capsule Take 3 capsules (225 mg total) by mouth daily. 90 capsule 1  . VENTOLIN HFA 108 (90 BASE) MCG/ACT inhaler Inhale 1 puff into the lungs every 6 (six) hours as needed for shortness of breath.   2   No current facility-administered medications for this visit.      Musculoskeletal: Strength & Muscle Tone: N/A Gait & Station: N/A Patient leans: N/A  Psychiatric Specialty Exam: Review of Systems  Psychiatric/Behavioral: Positive for depression. Negative for hallucinations, memory loss, substance abuse and suicidal ideas. The patient is nervous/anxious. The patient does not have insomnia.   All other systems reviewed and are negative.   There were no vitals taken for this visit.There is no height or weight on file to calculate BMI.  General Appearance: Fairly Groomed  Eye Contact:  Good  Speech:  Clear and Coherent  Volume:  Normal  Mood:  "better:  Affect:  Appropriate, Congruent and euthymic, reactive  Thought Process:  Coherent  Orientation:  Full (Time, Place, and Person)  Thought Content: Logical   Suicidal Thoughts:  No  Homicidal Thoughts:  No  Memory:  Immediate;   Good  Judgement:  Good  Insight:  Fair   Psychomotor Activity:  Normal  Concentration:  Concentration: Good and Attention Span: Good  Recall:  Good  Fund of Knowledge: Good  Language: Good  Akathisia:  No  Handed:  Right  AIMS (if indicated): not done  Assets:  Communication Skills Desire for Improvement  ADL's:  Intact  Cognition: WNL  Sleep:  Fair   Screenings: AIMS     Admission (Discharged) from 06/29/2019 in [place] [facility]  AIMS Total Score  0    AUDIT     Admission (Discharged) from 06/29/2019 in [place] [facility]  Alcohol Use Disorder Identification Test Final Score (AUDIT)  0       Assessment and Plan:  Jasmine Werner is a 63 y.o. year old female with a history of depression, alcohol use disorder in sustained remission,COPD,type II diabetes, who presents for follow up appointment for substance use disorder, depression.  This is aftercare visit after she was discharged from [facility] for [place] in the context of cocaine and marijuana use.   # MDD, mild, recurrent with psychotic features #r/o marijuana induced mood disorder Exam is notable for calmer affect, and the patient reports significant improvement in her mood symptoms.  Will continue venlafaxine to target depression.  Will decrease Abilify to avoid polypharmacy given she was also started on quetiapine in [facility]; she finds quetiapine to be helpful for insomnia.  Will continue quetiapine at the current dose at this time as adjunctive treatment for depression and also to target hallucinations/paranoia, although it was likely occurred in the context of substance use as described below.  Discussed potential risk of drowsiness, metabolic side effect and EPS. Will continue Trileptal at this time for mood dysregulation. Will consider tapering this medication off in the future to avoid polypharmacy. Will continue trazodone as needed for insomnia.  She has not used any Ativan since discharge; she agrees not to be back on  this medication given she will  likely be unable to get this medication at [facility].   # Marijuana use disorder # Cocaine use disorder She has not used any substances since she was discharged from [place].  She is motivated for sobriety and does have a plan to go to [facility]. Will continue motivational interview.   Plan I have reviewed and updated plans as below 1.ContinueEffexor225 mg daily  2. Decrease Abilify 5 mg daily   3. Continue quetiapine 200 mg at night  4. Continue Trileptal 150 mg twice a day  5. ContinueTrazodone 25- 50 mg at night as needed for sleep  6. Hold lorazepam 7. Next appointment: 9/21 at 3:20 for 30 mins, video  The patient demonstrates the following risk factors for suicide: Chronic risk factors for suicide include:psychiatric disorder ofdepression. Acute risk factorsfor suicide include: unemployment. Protective factorsfor this patient include: responsibility to others (children, family), coping skills and hope for the future. Considering these factors, the overall suicide risk at this point appears to bemoderate, but not at imminent risk to self. Patientisappropriate for outpatient follow up. She denies gun access at home.  The duration of this appointment visit was 25 minutes of non face-to-face time with the patient.  Greater than 50% of this time was spent in counseling, explanation of  diagnosis, planning of further management, and coordination of care.   [name], MD 07/14/2019, 8:58 AM

## 2019-07-12 NOTE — Progress Notes (Signed)
This encounter was created in error - please disregard.

## 2019-07-14 ENCOUNTER — Encounter (HOSPITAL_COMMUNITY): Payer: Self-pay | Admitting: Psychiatry

## 2019-07-14 ENCOUNTER — Ambulatory Visit (INDEPENDENT_AMBULATORY_CARE_PROVIDER_SITE_OTHER): Payer: Medicare Other | Admitting: Psychiatry

## 2019-07-14 ENCOUNTER — Other Ambulatory Visit: Payer: Self-pay

## 2019-07-14 DIAGNOSIS — F1990 Other psychoactive substance use, unspecified, uncomplicated: Secondary | ICD-10-CM | POA: Diagnosis not present

## 2019-07-14 DIAGNOSIS — F330 Major depressive disorder, recurrent, mild: Secondary | ICD-10-CM

## 2019-07-14 MED ORDER — TRAZODONE HCL 100 MG PO TABS: 100.0000 mg | ORAL_TABLET | Freq: Every evening | ORAL | 1 refills | Status: DC | PRN

## 2019-07-14 MED ORDER — ARIPIPRAZOLE 5 MG PO TABS: 5.0000 mg | ORAL_TABLET | Freq: Every day | ORAL | 1 refills | Status: DC

## 2019-07-14 MED ORDER — QUETIAPINE FUMARATE 200 MG PO TABS: 200.0000 mg | ORAL_TABLET | Freq: Every day | ORAL | 1 refills | Status: DC

## 2019-07-14 MED ORDER — OXCARBAZEPINE 150 MG PO TABS: 150.0000 mg | ORAL_TABLET | Freq: Two times a day (BID) | ORAL | 1 refills | Status: DC

## 2019-07-14 MED ORDER — VENLAFAXINE HCL ER 75 MG PO CP24: 225.0000 mg | ORAL_CAPSULE | Freq: Every day | ORAL | 1 refills | Status: DC

## 2019-07-14 NOTE — Patient Instructions (Addendum)
1.ContinueEffexor225 mg daily  2. Decrease Abilify 5 mg daily   3. Continue quetiapine 200 mg at night  4. Continue Trileptal 150 mg twice a day  5. ContinueTrazodone 25- 50 mg at night as needed for sleep  6. Hold lorazepam 7. Next appointment: 9/21 at 3:20

## 2019-07-16 ENCOUNTER — Other Ambulatory Visit: Payer: Self-pay

## 2019-07-16 DIAGNOSIS — Z20822 Contact with and (suspected) exposure to covid-19: Secondary | ICD-10-CM

## 2019-07-18 LAB — NOVEL CORONAVIRUS, NAA: SARS-CoV-2, NAA: NOT DETECTED

## 2019-08-16 ENCOUNTER — Other Ambulatory Visit (HOSPITAL_COMMUNITY): Payer: Medicare Other

## 2019-08-19 ENCOUNTER — Encounter (HOSPITAL_COMMUNITY): Payer: Self-pay

## 2019-08-19 ENCOUNTER — Ambulatory Visit (HOSPITAL_COMMUNITY): Admit: 2019-08-19 | Payer: Medicare Other | Admitting: Internal Medicine

## 2019-08-19 SURGERY — COLONOSCOPY WITH PROPOFOL
Anesthesia: Monitor Anesthesia Care

## 2019-08-19 NOTE — Progress Notes (Signed)
Virtual Visit via Video Note  I connected with Jasmine Werner on 08/23/19 at  3:20 PM EDT by a video enabled telemedicine application and verified that I am speaking with the correct person using two identifiers.   I discussed the limitations of evaluation and management by telemedicine and the availability of in person appointments. The patient expressed understanding and agreed to proceed.     I discussed the assessment and treatment plan with the patient. The patient was provided an opportunity to ask questions and all were answered. The patient agreed with the plan and demonstrated an understanding of the instructions.   The patient was advised to call back or seek an in-person evaluation if the symptoms worsen or if the condition fails to improve as anticipated.  I provided 25 minutes of non-face-to-face time during this encounter.   [name], MD    [facility] MD/PA/NP OP Progress Note  08/23/2019 3:47 PM Jasmine Werner  MRN:  [id number]  Chief Complaint:  Chief Complaint    Depression; Follow-up     HPI:  This is a severe follow-up appointment for cocaine/marijuana use disorder and depression.  She states that she is at [facility].  She feels great that she is now 60 days of sobriety. She attends group and spiritual therapy. She is getting a new sponsor at the local area. She is planning to attend AA meeting and have a new sponsor when she returns in January. She states that she was very stressed when she relapsed. She found out that there were her brother, sister who adopted out, and her cousin's husband deceased. There were family members who were using drug. She ended up spending $10,000 on drugs.  Although she still has craving for drug, she has been able to handle it well.  She also reports good support from her daughter, who visits the patient.  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  She denies insomnia.  She has good motivation and energy.  She denies feeling depressed.  She  has fair concentration.  She denies SI.  She denies anxiety or panic attacks. She feels that she may have AH, although she cannot tell what it is. She denies VH. She denies paranoia.  She states that quetiapine was changed to as needed at the facility; she has strong wishes the medication to be managed by this examiner.    Visit Diagnosis: No diagnosis found.  Past Psychiatric History: Please see initial evaluation for full details. I have reviewed the history. No updates at this time.     Past Medical History:  Past Medical History:  Diagnosis Date  . Alcohol use   . Allergy   . Anxiety   . Asthma   . Childhood schizophrenia   . Chronic abdominal pain   . Collagen vascular disease ([place])   . COPD (chronic obstructive pulmonary disease) ([place])     Past Surgical History:  Procedure Laterality Date  . ABDOMINAL HYSTERECTOMY    . APPENDECTOMY      Family Psychiatric History: Please see initial evaluation for full details. I have reviewed the history. No updates at this time.     Family History:  Family History  Problem Relation Age of Onset  . Asthma Other   . COPD Other   . Alcoholism Other   . Heart attack Other   . Depression Other   . Mental illness Other   . Alcohol abuse Mother   . Colon cancer Mother 33  . Alcohol  abuse Father   . Depression Father     Social History:  Social History   Socioeconomic History  . Marital status: Widowed    Spouse name: Not on file  . Number of children: 5  . Years of education: Not on file  . Highest education level: Some college, no degree  Occupational History  . Not on file  Social Needs  . Financial resource strain: Not hard at all  . Food insecurity    Worry: Sometimes true    Inability: Never true  . Transportation needs    Medical: No    Non-medical: No  Tobacco Use  . Smoking status: Current Every Day Smoker    Packs/day: 0.25    Types: Cigarettes  . Smokeless tobacco: Never Used  Substance and Sexual  Activity  . Alcohol use: No  . Drug use: Yes    Types: Marijuana, Cocaine  . Sexual activity: Never  Lifestyle  . Physical activity    Days per week: Not on file    Minutes per session: Not on file  . Stress: Very much  Relationships  . Social Herbalist on phone: Not on file    Gets together: Not on file    Attends religious service: Not on file    Active member of club or organization: Not on file    Attends meetings of clubs or organizations: More than 4 times per year    Relationship status: Widowed  Other Topics Concern  . Not on file  Social History Narrative  . Not on file    Allergies:  Allergies  Allergen Reactions  . Tramadol Nausea And Vomiting    Metabolic Disorder Labs: Lab Results  Component Value Date   HGBA1C 6.2 (H) 06/30/2019   MPG 131.24 06/30/2019   No results found for: PROLACTIN Lab Results  Component Value Date   CHOL 191 06/30/2019   TRIG 132 06/30/2019   HDL 32 (L) 06/30/2019   CHOLHDL 6.0 06/30/2019   VLDL 26 06/30/2019   LDLCALC 133 (H) 06/30/2019   Lab Results  Component Value Date   TSH 6.057 (H) 06/30/2019    Therapeutic Level Labs: No results found for: LITHIUM No results found for: VALPROATE No components found for:  CBMZ  Current Medications: Current Outpatient Medications  Medication Sig Dispense Refill  . ARIPiprazole (ABILIFY) 5 MG tablet Take 1 tablet (5 mg total) by mouth daily. 30 tablet 1  . budesonide-formoterol (SYMBICORT) 160-4.5 MCG/ACT inhaler Inhale 2 puffs into the lungs 2 (two) times daily.    Marland Kitchen ipratropium-albuterol (DUONEB) 0.5-2.5 (3) MG/3ML SOLN Inhale 3 mLs into the lungs every 6 (six) hours as needed (Shortness of Breath).   2  . lisinopril (ZESTRIL) 10 MG tablet Take 1 tablet (10 mg total) by mouth daily. 30 tablet 0  . OXcarbazepine (TRILEPTAL) 150 MG tablet Take 1 tablet (150 mg total) by mouth 2 (two) times daily. 60 tablet 1  . polyethylene glycol-electrolytes (TRILYTE) 420 g solution  Take 4,000 mLs by mouth as directed. 4000 mL 0  . QUEtiapine (SEROQUEL) 200 MG tablet Take 1 tablet (200 mg total) by mouth at bedtime. 30 tablet 1  . traZODone (DESYREL) 100 MG tablet Take 1 tablet (100 mg total) by mouth at bedtime as needed for sleep. 30 tablet 1  . venlafaxine XR (EFFEXOR-XR) 75 MG 24 hr capsule Take 3 capsules (225 mg total) by mouth daily. 90 capsule 1  . VENTOLIN HFA 108 (90 BASE) MCG/ACT  inhaler Inhale 1 puff into the lungs every 6 (six) hours as needed for shortness of breath.   2   No current facility-administered medications for this visit.      Musculoskeletal: Strength & Muscle Tone: N/A Gait & Station: N/A Patient leans: N/A  Psychiatric Specialty Exam: Review of Systems  Psychiatric/Behavioral: Positive for hallucinations. Negative for depression, memory loss, substance abuse and suicidal ideas. The patient is not nervous/anxious and does not have insomnia.   All other systems reviewed and are negative.   There were no vitals taken for this visit.There is no height or weight on file to calculate BMI.  General Appearance: Fairly Groomed  Eye Contact:  Good  Speech:  Clear and Coherent  Volume:  Normal  Mood:  "good"  Affect:  Appropriate, Congruent and Full Range  Thought Process:  Coherent  Orientation:  Full (Time, Place, and Person)  Thought Content: Logical   Suicidal Thoughts:  No  Homicidal Thoughts:  No  Memory:  Immediate;   Good  Judgement:  Good  Insight:  Fair  Psychomotor Activity:  Normal  Concentration:  Concentration: Good and Attention Span: Good  Recall:  Good  Fund of Knowledge: Good  Language: Good  Akathisia:  No  Handed:  Right  AIMS (if indicated): not done  Assets:  Communication Skills Desire for Improvement  ADL's:  Intact  Cognition: WNL  Sleep:  Fair   Screenings: AIMS     Admission (Discharged) from 06/29/2019 in [place] [facility]  AIMS Total Score  0    AUDIT     Admission  (Discharged) from 06/29/2019 in [place] [facility]  Alcohol Use Disorder Identification Test Final Score (AUDIT)  0       Assessment and Plan:  Jasmine Werner is a 63 y.o. year old female with a history of depression, cocaine use disorder, COPD,type II diabetes, who presents for follow up appointment for No diagnosis found.   # Cocaine use disorder # Marijuana use disorder She is at residential facility for substance use (until late Dec), and is at [facility] for sobriety. Will continue motivational interview.   #MDD, recurrent in partial remission  #r/o marijuana induced mood disorder Exam is notable for calmer affect, and she denies significant mood symptoms since her last visit.  Will discontinue quetiapine to avoid polypharmacy (which was started at [facility]).  Continue venlafaxine to target depression.  Will continue Abilify as adjunctive treatment for depression and also to target occasional hallucination.  Gust potential risk of EPS and metabolic side effect differential of hallucination includes marijuana induced.  Will continue Trileptal at this time for mood dysregulation given patient reports good benefit from this medication (started at [facility]). However, will plan to slowly taper down this medication in the future to avoid polypharmacy.   Plan I have reviewed and updated plans as below (She is at [facility] nest. She will contact the office if she needs any refill of the medication) 1.ContinueEffexor225 mg daily  2.Continue Abilify 5 mg daily  3. Continue Trileptal 150 mg twice a day  4. Discontinue quetiapine 5. ContinueTrazodone 100 mg at night as needed for sleep  6. Next appointment:in 4 months  - Informed the patient that this examiner will be on leave starting around mid-November for a few months, and the patient will be seen by a covering provider if necessary during that time.     The patient demonstrates the following risk factors for  suicide: Chronic  risk factors for suicide include:psychiatric disorder ofdepression. Acute risk factorsfor suicide include: unemployment. Protective factorsfor this patient include: responsibility to others (children, family), coping skills and hope for the future. Considering these factors, the overall suicide risk at this point appears to bemoderate, but not at imminent risk to self. Patientisappropriate for outpatient follow up. She denies gun access at home.  The duration of this appointment visit was 25 minutes of non face-to-face time with the patient.  Greater than 50% of this time was spent in counseling, explanation of  diagnosis, planning of further management, and coordination of care.  [name], MD 08/23/2019, 3:47 PM

## 2019-08-23 ENCOUNTER — Other Ambulatory Visit: Payer: Self-pay

## 2019-08-23 ENCOUNTER — Encounter (HOSPITAL_COMMUNITY): Payer: Self-pay | Admitting: Psychiatry

## 2019-08-23 ENCOUNTER — Ambulatory Visit (INDEPENDENT_AMBULATORY_CARE_PROVIDER_SITE_OTHER): Payer: Medicaid Other | Admitting: Psychiatry

## 2019-08-23 DIAGNOSIS — F1994 Other psychoactive substance use, unspecified with psychoactive substance-induced mood disorder: Secondary | ICD-10-CM | POA: Diagnosis not present

## 2019-08-23 DIAGNOSIS — F1990 Other psychoactive substance use, unspecified, uncomplicated: Secondary | ICD-10-CM

## 2019-08-23 DIAGNOSIS — F3341 Major depressive disorder, recurrent, in partial remission: Secondary | ICD-10-CM | POA: Diagnosis not present

## 2019-10-06 IMAGING — DX CHEST - 2 VIEW
2 series · 2 of 2 positions shown · non-contrast
Comparison: 11/28/2017

CLINICAL DATA: Nausea, vomiting

EXAM:
CHEST - 2 VIEW

[chest ap]
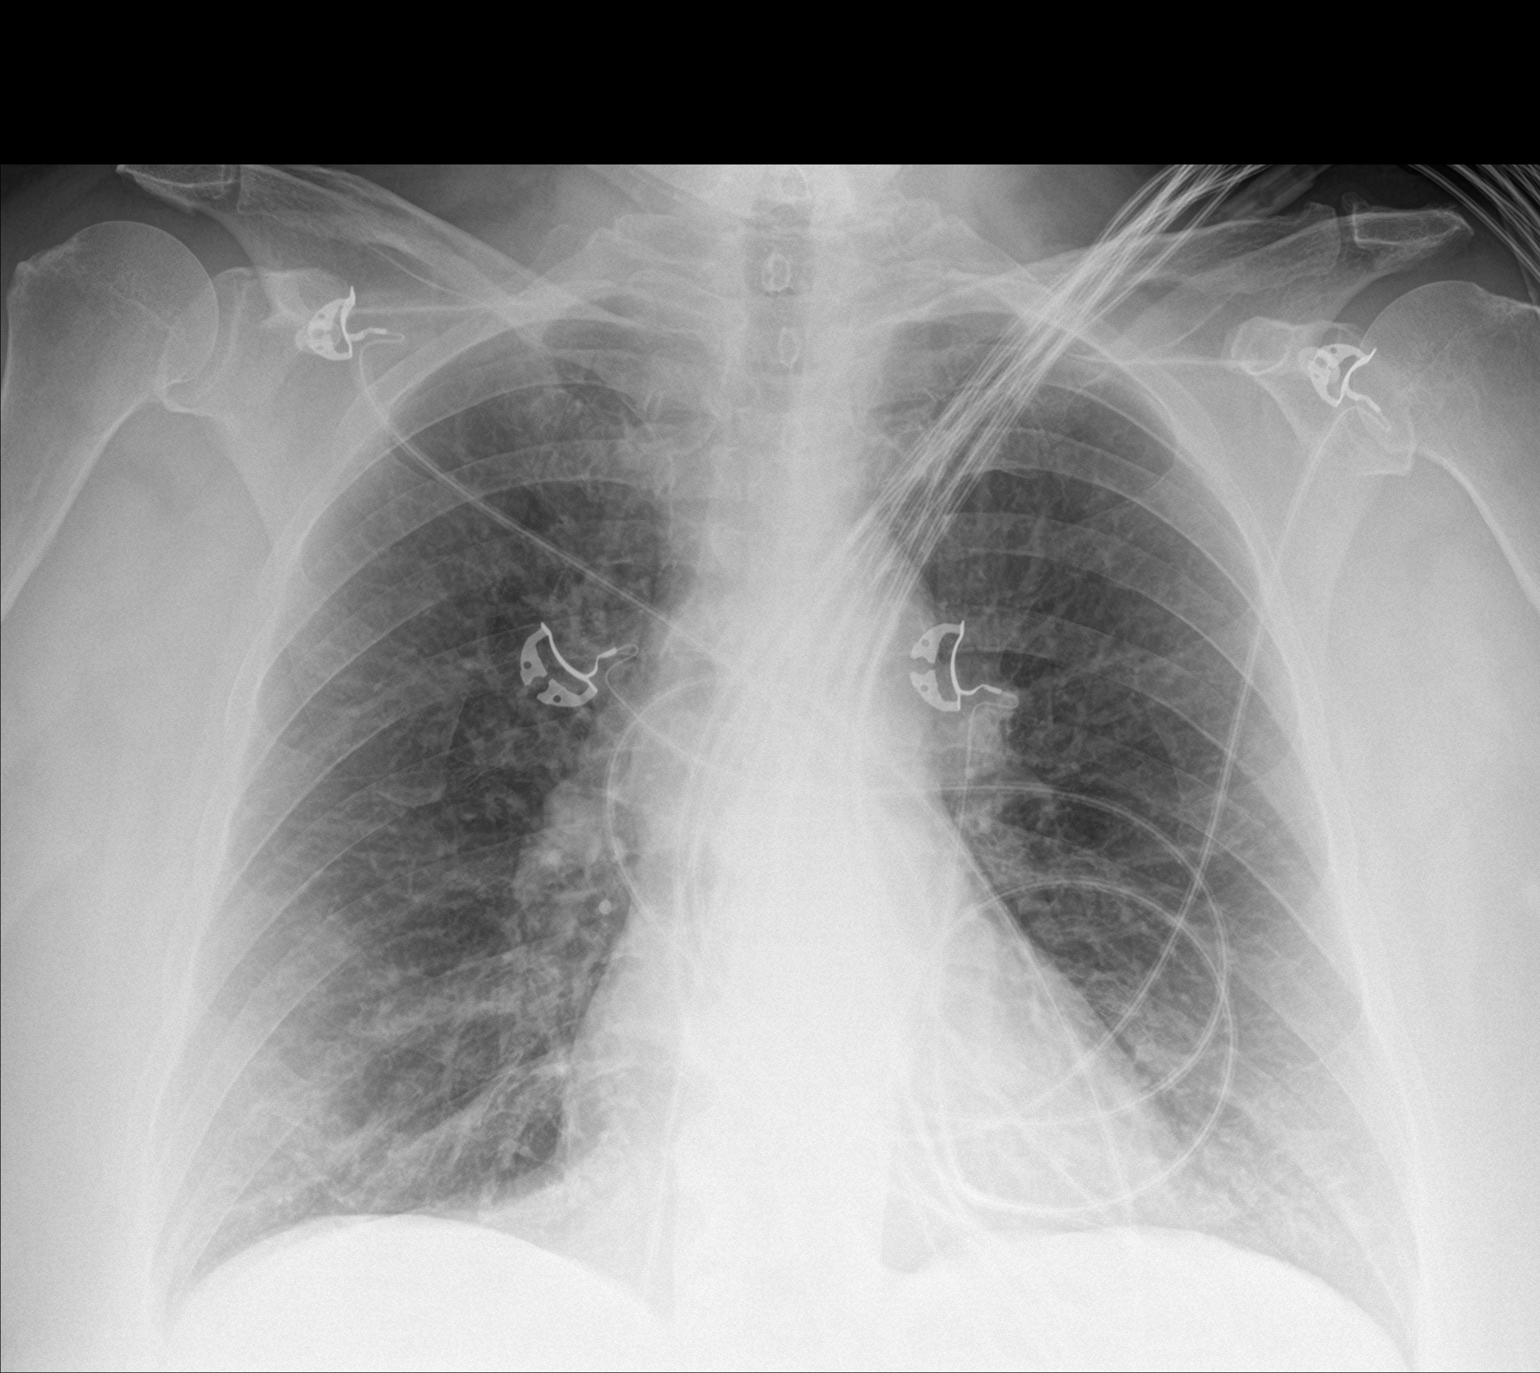

[chest lat]
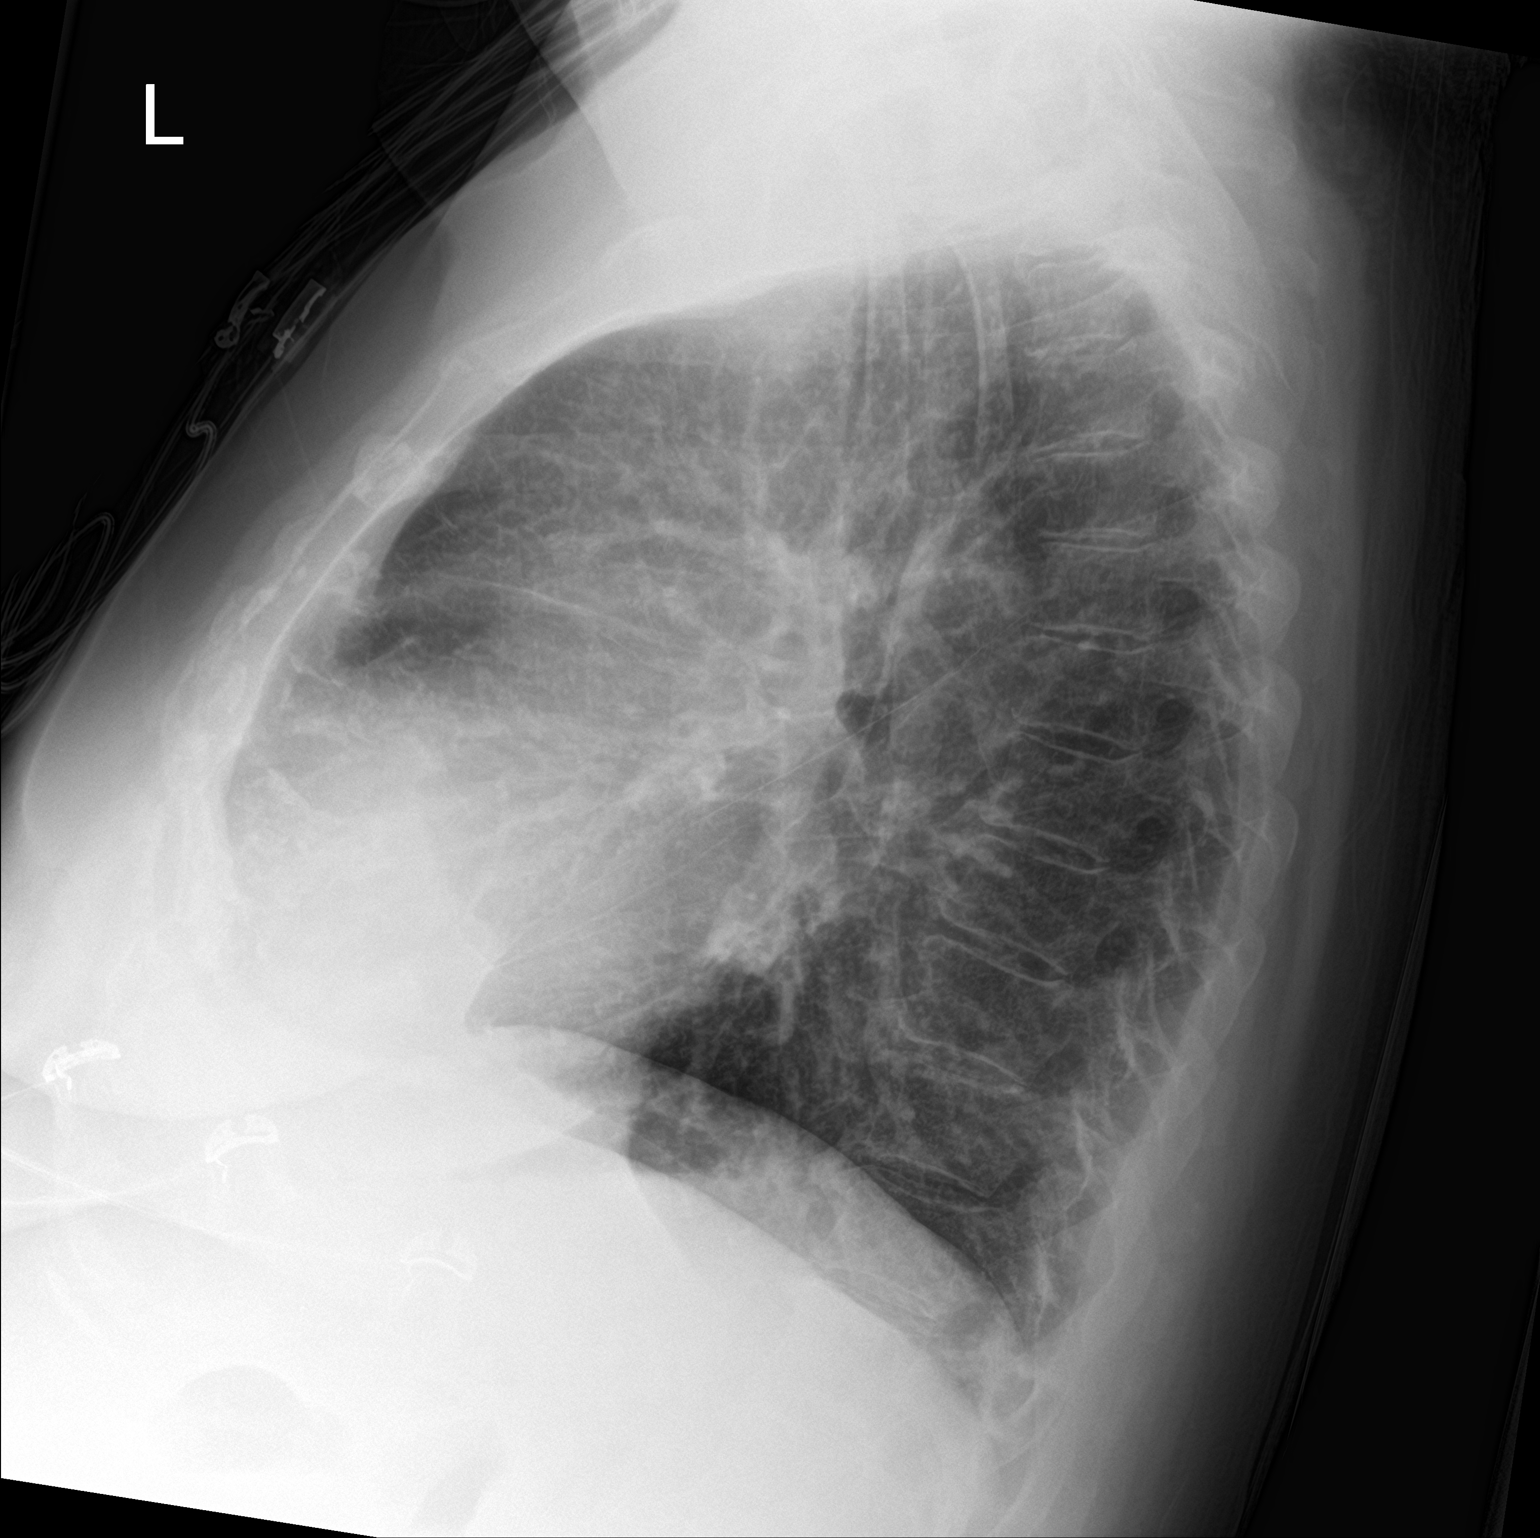

[2 of 2 positions shown; findings below may reference images not displayed]

FINDINGS: Heart and mediastinal contours are within normal limits. No focal
opacities or effusions. No acute bony abnormality.
IMPRESSION: No active cardiopulmonary disease.

## 2019-10-06 IMAGING — CT CT ABDOMEN AND PELVIS WITH CONTRAST
2 of 5 series · 17 of 46 positions shown, 19 images · IV contrast (Isovue)
Comparison: None.

CLINICAL DATA: Upper abdominal pain, nausea, vomiting

EXAM:
CT ABDOMEN AND PELVIS WITH CONTRAST
TECHNIQUE: Multidetector CT imaging of the abdomen and pelvis was performed
using the standard protocol following bolus administration of
intravenous contrast.
CONTRAST:  100mL OMNIPAQUE IOHEXOL 300 MG/ML  SOLN

[Series 2: axial st · axial · 0.77mm/px · z∈[+679,+1044]mm · 14 of 85 slices shown, 16 images]
[im 6/85  soft-tissue]
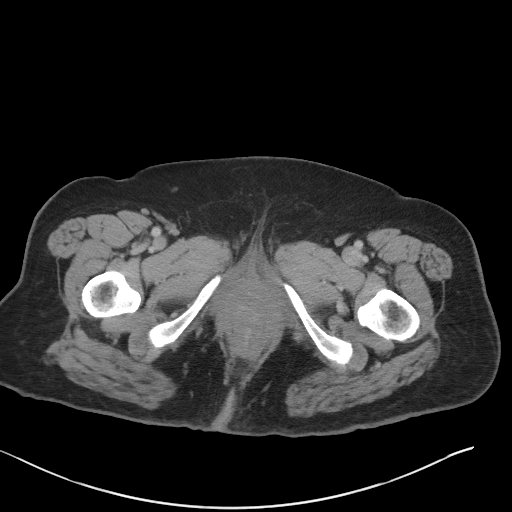
[im 6/85  bone]
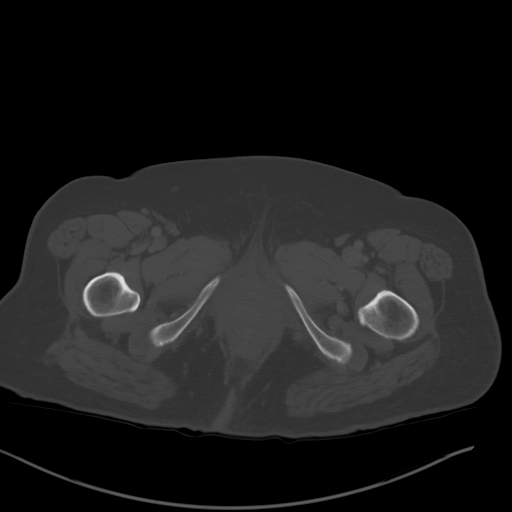
[im 12/85  soft-tissue]
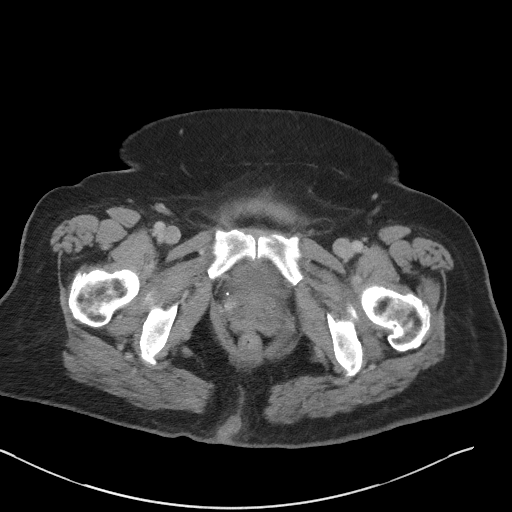
[im 17/85  soft-tissue]
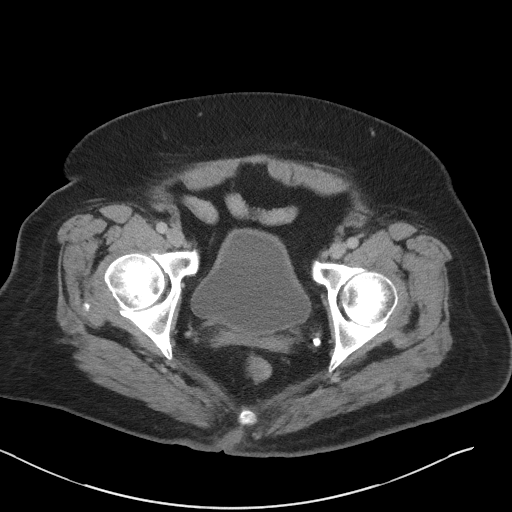
[im 23/85  soft-tissue]
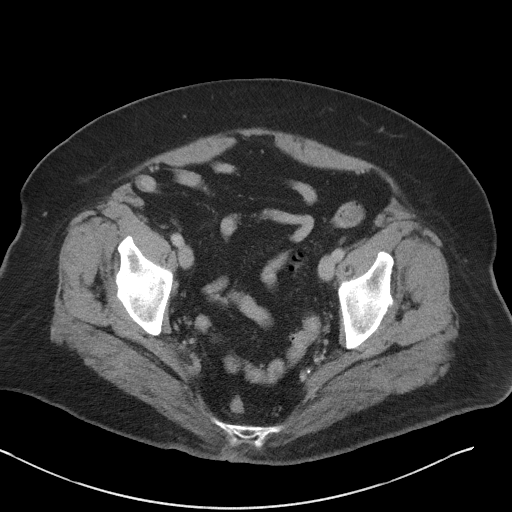
[im 29/85  soft-tissue]
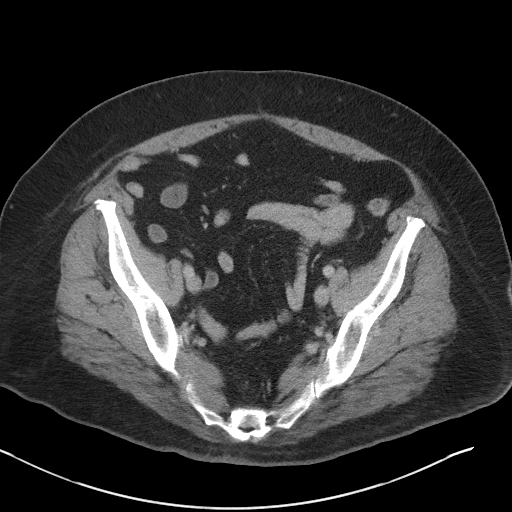
[im 34/85  soft-tissue]
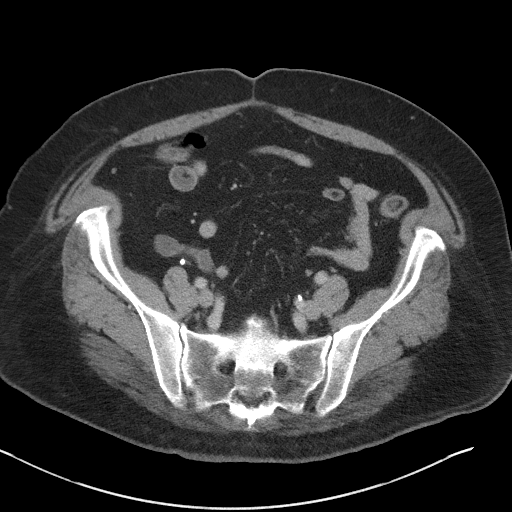
[im 40/85  soft-tissue]
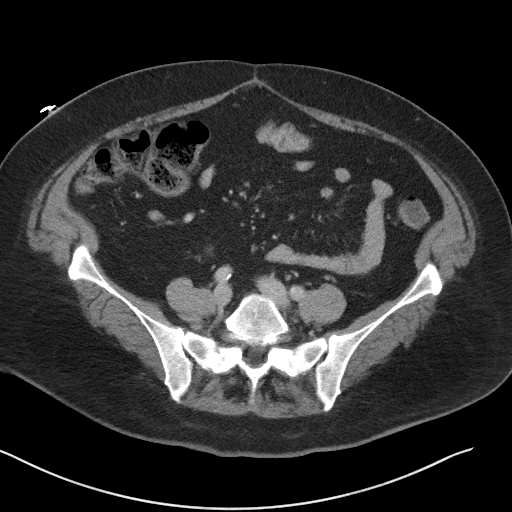
[im 45/85  soft-tissue]
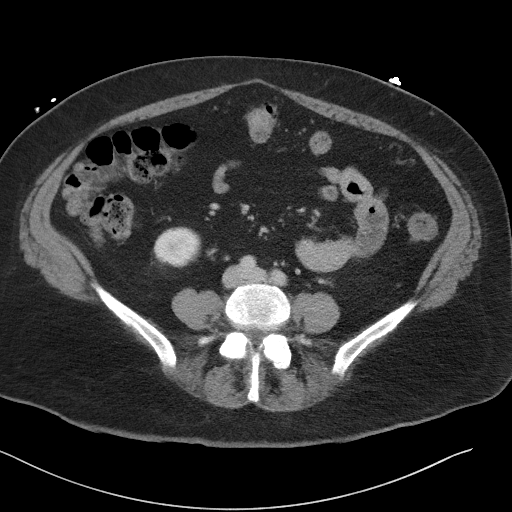
[im 51/85  soft-tissue]
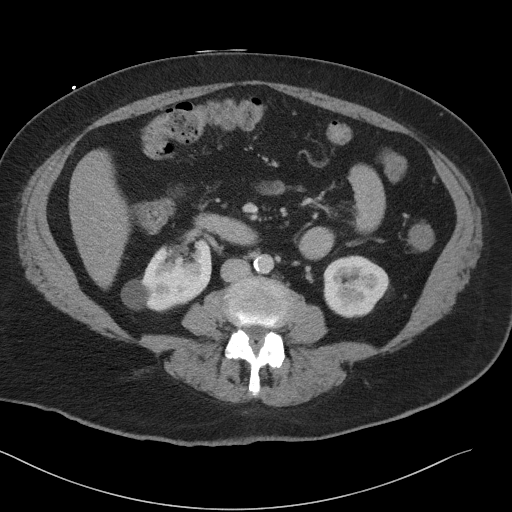
[im 51/85  bone]
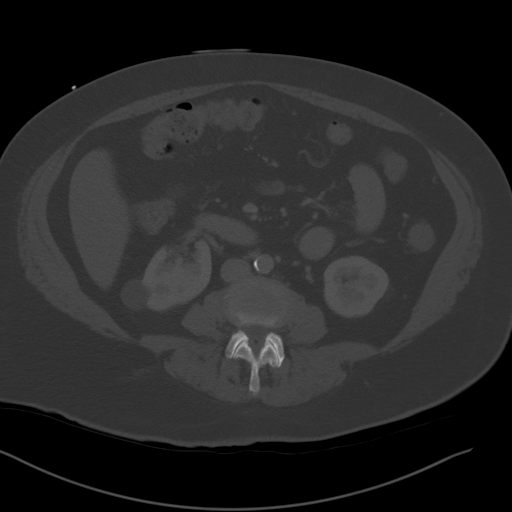
[im 57/85  soft-tissue]
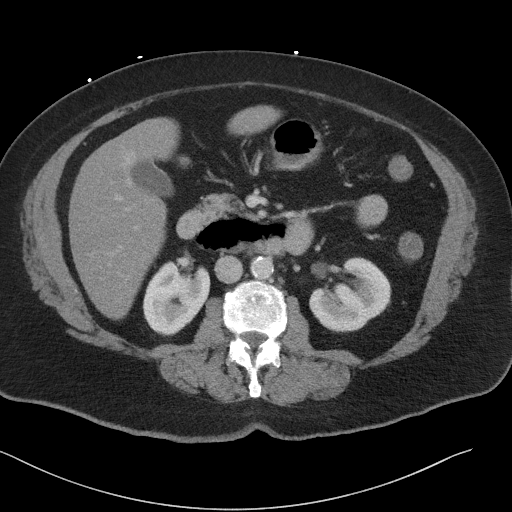
[im 62/85  soft-tissue]
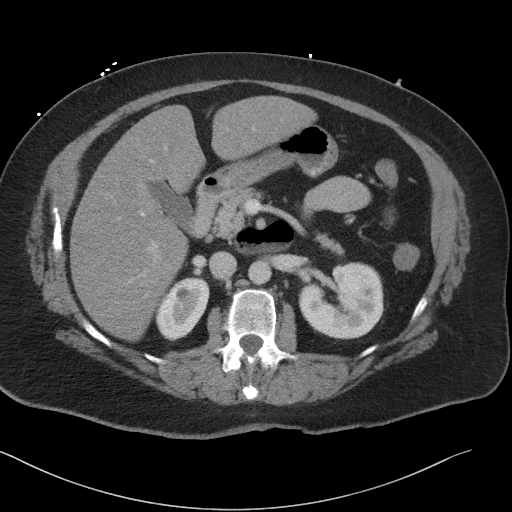
[im 68/85  soft-tissue]
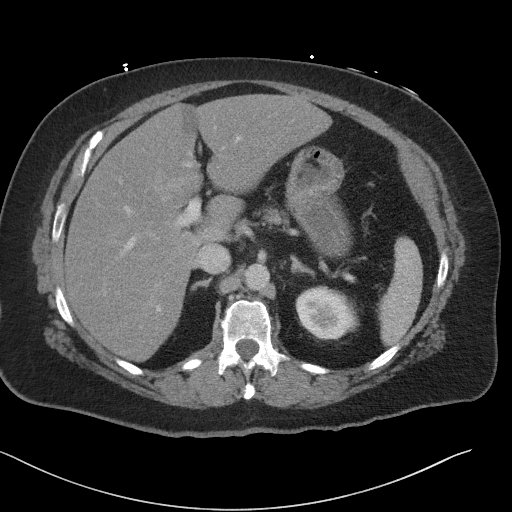
[im 73/85  soft-tissue]
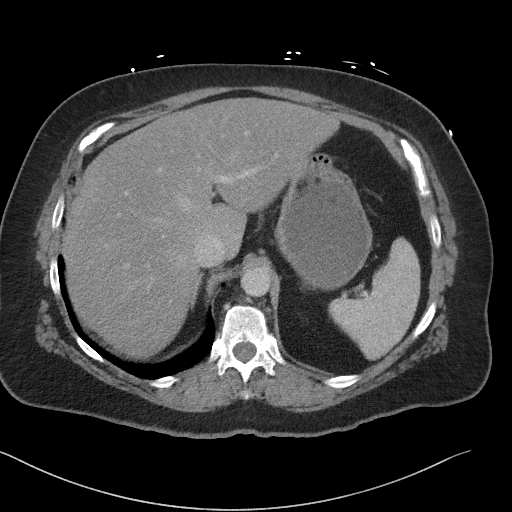
[im 79/85  soft-tissue]
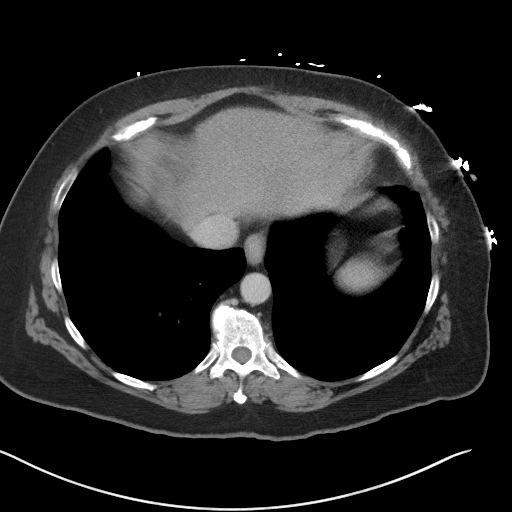

[Series 5: coronal st · coronal · 0.83mm/px · 3 of 102 slices shown]
[im 34/102  soft-tissue]
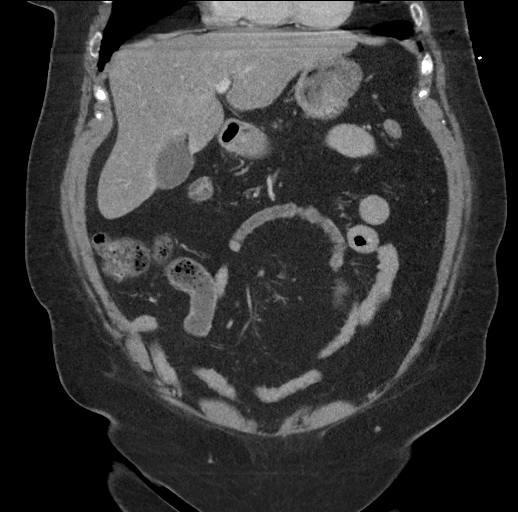
[im 45/102  soft-tissue]
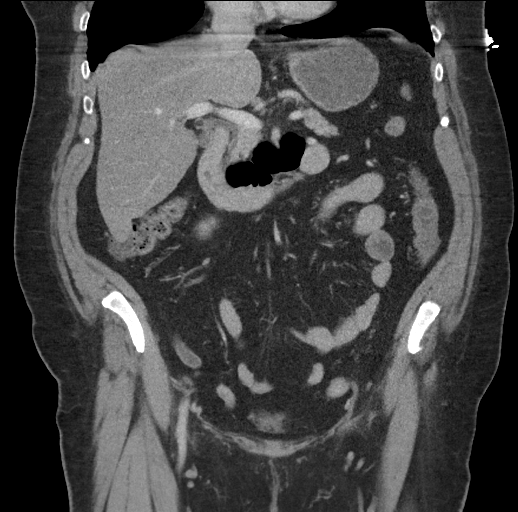
[im 57/102  soft-tissue]
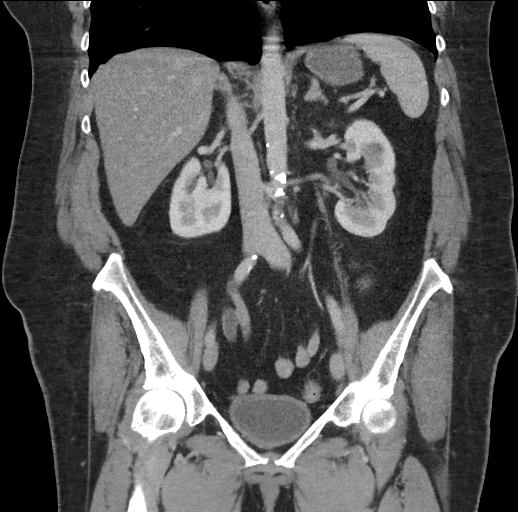

[17 of 46 positions shown; findings below may reference images not displayed]

FINDINGS: Lower chest: Lung bases are clear. No effusions. Heart is normal
size.

Hepatobiliary: Diffuse low-density throughout the liver compatible
with fatty infiltration. No focal abnormality. Gallbladder
unremarkable.

Pancreas: No focal abnormality or ductal dilatation.

Spleen: No focal abnormality.  Normal size.

Adrenals/Urinary Tract: Bilateral renal cysts appear benign. No
hydronephrosis. Adrenal glands and urinary bladder unremarkable.

Stomach/Bowel: Stomach, large and small bowel grossly unremarkable.
Two large duodenal diverticula measuring 4 cm and 5 cm.

Vascular/Lymphatic: Aortic atherosclerosis. No enlarged abdominal or
pelvic lymph nodes.

Reproductive: Prior hysterectomy.  No adnexal masses.

Other: No free fluid or free air.

Musculoskeletal: No acute bony abnormality.
IMPRESSION: Mild fatty infiltration of the liver.

Aortic atherosclerosis.

No acute findings in the abdomen or pelvis.

## 2019-10-16 DIAGNOSIS — R000 Tachycardia, unspecified: Secondary | ICD-10-CM | POA: Diagnosis not present

## 2019-10-16 DIAGNOSIS — Z885 Allergy status to narcotic agent status: Secondary | ICD-10-CM | POA: Diagnosis not present

## 2019-10-16 DIAGNOSIS — R0600 Dyspnea, unspecified: Secondary | ICD-10-CM | POA: Diagnosis not present

## 2019-10-16 DIAGNOSIS — Z20828 Contact with and (suspected) exposure to other viral communicable diseases: Secondary | ICD-10-CM | POA: Diagnosis not present

## 2019-10-16 DIAGNOSIS — J441 Chronic obstructive pulmonary disease with (acute) exacerbation: Secondary | ICD-10-CM | POA: Diagnosis not present

## 2019-10-16 DIAGNOSIS — R002 Palpitations: Secondary | ICD-10-CM | POA: Diagnosis not present

## 2019-10-16 DIAGNOSIS — M79605 Pain in left leg: Secondary | ICD-10-CM | POA: Diagnosis not present

## 2019-12-02 ENCOUNTER — Encounter: Payer: Self-pay | Admitting: Psychiatry

## 2019-12-02 ENCOUNTER — Other Ambulatory Visit: Payer: Self-pay

## 2019-12-02 ENCOUNTER — Ambulatory Visit (INDEPENDENT_AMBULATORY_CARE_PROVIDER_SITE_OTHER): Payer: Medicare Other | Admitting: Psychiatry

## 2019-12-02 DIAGNOSIS — F4310 Post-traumatic stress disorder, unspecified: Secondary | ICD-10-CM | POA: Diagnosis not present

## 2019-12-02 DIAGNOSIS — F1021 Alcohol dependence, in remission: Secondary | ICD-10-CM | POA: Diagnosis not present

## 2019-12-02 DIAGNOSIS — F1421 Cocaine dependence, in remission: Secondary | ICD-10-CM

## 2019-12-02 DIAGNOSIS — F3342 Major depressive disorder, recurrent, in full remission: Secondary | ICD-10-CM | POA: Insufficient documentation

## 2019-12-02 MED ORDER — TRAZODONE HCL 100 MG PO TABS: 100.0000 mg | ORAL_TABLET | Freq: Every evening | ORAL | 1 refills | Status: DC | PRN

## 2019-12-02 MED ORDER — VENLAFAXINE HCL ER 75 MG PO CP24: 225.0000 mg | ORAL_CAPSULE | Freq: Every day | ORAL | 1 refills | Status: DC

## 2019-12-02 NOTE — Progress Notes (Signed)
[facility] MD OP Progress Note  I connected with  Jasmine Werner on 12/02/19 by a video enabled telemedicine application and verified that I am speaking with the correct person using two identifiers.   I discussed the limitations of evaluation and management by telemedicine. The patient expressed understanding and agreed to proceed.    12/02/2019 9:49 AM Jasmine Werner  MRN:  [id number]  Chief Complaint: " I am doing well."  HPI: Patient stated that she was discharged from [facility] substance abuse recovery program yesterday and she just returned back home yesterday.  She stated that she is very happy as she is now sober and feels healthy.  She stated that her mood has been stable and overall she is in a much better place in her life.  She informed that when she was hospitalized to psychiatry unit in July 2020 Abilify and Trileptal were added to her regimen.  She informed that she believes ever since Trileptal was added she started to have involuntary movements of her lower jaw.  She wants this to go away and she asked if she could get off of the Trileptal.  She also stated that she did not think she needed Abilify anymore as Effexor by itself has been very helpful.  She has been on Effexor for more than a year now.  She still takes trazodone at bedtime and helps with sleep. She stated that she is currently in outpatient recovery program and she plans to continue working on her sobriety maintenance.  She feels happy as she is with her family now.  Visit Diagnosis:    ICD-10-CM   1. MDD (major depressive disorder), recurrent, in full remission ([place])  F33.42   2. Alcohol use disorder, moderate, in sustained remission ([facility])  F10.21   3. Posttraumatic stress disorder  F43.10   4. Cocaine use disorder, moderate, in early remission ([place])  F14.21     Past Psychiatric History: MDD, PTSD, substance use disorder  Past Medical History:  Past Medical History:  Diagnosis Date  . Alcohol use   . Allergy    . Anxiety   . Asthma   . Childhood schizophrenia   . Chronic abdominal pain   . Collagen vascular disease ([place])   . COPD (chronic obstructive pulmonary disease) ([place])     Past Surgical History:  Procedure Laterality Date  . ABDOMINAL HYSTERECTOMY    . APPENDECTOMY      Family Psychiatric History: see below  Family History:  Family History  Problem Relation Age of Onset  . Asthma Other   . COPD Other   . Alcoholism Other   . Heart attack Other   . Depression Other   . Mental illness Other   . Alcohol abuse Mother   . Colon cancer Mother 41  . Alcohol abuse Father   . Depression Father     Social History:  Social History   Socioeconomic History  . Marital status: Widowed    Spouse name: Not on file  . Number of children: 5  . Years of education: Not on file  . Highest education level: Some college, no degree  Occupational History  . Not on file  Tobacco Use  . Smoking status: Current Every Day Smoker    Packs/day: 0.25    Types: Cigarettes  . Smokeless tobacco: Never Used  Substance and Sexual Activity  . Alcohol use: No  . Drug use: Yes    Types: Marijuana, Cocaine  . Sexual activity: Never  Other Topics  Concern  . Not on file  Social History Narrative  . Not on file   Social Determinants of Health   Financial Resource Strain:   . Difficulty of Paying Living Expenses: Not on file  Food Insecurity:   . Worried About Charity fundraiser in the Last Year: Not on file  . Ran Out of Food in the Last Year: Not on file  Transportation Needs:   . Lack of Transportation (Medical): Not on file  . Lack of Transportation (Non-Medical): Not on file  Physical Activity:   . Days of Exercise per Week: Not on file  . Minutes of Exercise per Session: Not on file  Stress:   . Feeling of Stress : Not on file  Social Connections:   . Frequency of Communication with Friends and Family: Not on file  . Frequency of Social Gatherings with Friends and Family: Not on  file  . Attends Religious Services: Not on file  . Active Member of Clubs or Organizations: Not on file  . Attends Archivist Meetings: Not on file  . Marital Status: Not on file    Allergies:  Allergies  Allergen Reactions  . Tramadol Nausea And Vomiting    Metabolic Disorder Labs: Lab Results  Component Value Date   HGBA1C 6.2 (H) 06/30/2019   MPG 131.24 06/30/2019   No results found for: PROLACTIN Lab Results  Component Value Date   CHOL 191 06/30/2019   TRIG 132 06/30/2019   HDL 32 (L) 06/30/2019   CHOLHDL 6.0 06/30/2019   VLDL 26 06/30/2019   LDLCALC 133 (H) 06/30/2019   Lab Results  Component Value Date   TSH 6.057 (H) 06/30/2019    Therapeutic Level Labs: No results found for: LITHIUM No results found for: VALPROATE No components found for:  CBMZ  Current Medications: Current Outpatient Medications  Medication Sig Dispense Refill  . ARIPiprazole (ABILIFY) 5 MG tablet Take 1 tablet (5 mg total) by mouth daily. 30 tablet 1  . budesonide-formoterol (SYMBICORT) 160-4.5 MCG/ACT inhaler Inhale 2 puffs into the lungs 2 (two) times daily.    Marland Kitchen ipratropium-albuterol (DUONEB) 0.5-2.5 (3) MG/3ML SOLN Inhale 3 mLs into the lungs every 6 (six) hours as needed (Shortness of Breath).   2  . lisinopril (ZESTRIL) 10 MG tablet Take 1 tablet (10 mg total) by mouth daily. 30 tablet 0  . OXcarbazepine (TRILEPTAL) 150 MG tablet Take 1 tablet (150 mg total) by mouth 2 (two) times daily. 60 tablet 1  . polyethylene glycol-electrolytes (TRILYTE) 420 g solution Take 4,000 mLs by mouth as directed. 4000 mL 0  . traZODone (DESYREL) 100 MG tablet Take 1 tablet (100 mg total) by mouth at bedtime as needed for sleep. 30 tablet 1  . venlafaxine XR (EFFEXOR-XR) 75 MG 24 hr capsule Take 3 capsules (225 mg total) by mouth daily. 90 capsule 1  . VENTOLIN HFA 108 (90 BASE) MCG/ACT inhaler Inhale 1 puff into the lungs every 6 (six) hours as needed for shortness of breath.   2   No  current facility-administered medications for this visit.     Musculoskeletal: Strength & Muscle Tone: unable to assess due to telemed visit Gait & Station: unable to assess due to telemed visit Patient leans: unable to assess due to telemed visit  Psychiatric Specialty Exam: Review of Systems  There were no vitals taken for this visit.There is no height or weight on file to calculate BMI.  General Appearance: Well Groomed  Eye Contact:  Good  Speech:  Clear and Coherent and Normal Rate  Volume:  Normal  Mood:  Euthymic  Affect:  Congruent  Thought Process:  Goal Directed, Linear and Descriptions of Associations: Intact  Orientation:  Full (Time, Place, and Person)  Thought Content: Logical   Suicidal Thoughts:  No  Homicidal Thoughts:  No  Memory:  Recent;   Good Remote;   Good  Judgement:  Fair  Insight:  Fair  Psychomotor Activity:  Involunatry movements of lower jaw noted  Concentration:  Concentration: Good  Recall:  Good  Fund of Knowledge: Good  Language: Good  Akathisia:  Negative  Handed:  Right  AIMS (if indicated): involuntary lower jaw movements present  Assets:  Communication Skills Desire for Improvement Financial Resources/Insurance Housing Social Support  ADL's:  Intact  Cognition: WNL  Sleep:  Good   Screenings: AIMS     Admission (Discharged) from 06/29/2019 in [place] [facility]  AIMS Total Score  0    AUDIT     Admission (Discharged) from 06/29/2019 in [place] [facility]  Alcohol Use Disorder Identification Test Final Score (AUDIT)  0       Assessment and Plan: Patient reported that she is doing much better and asked if she could get off of the augmenting agents Trileptal and Abilify.  She informed that she has noticed involuntary lower jaw movements since Trileptal was added to her regimen.  Patient was informed that these movements could be secondary to Abilify.  Patient stated that she  has been on Effexor for more than a year and she thinks she will do well on monotherapy with it.  1. MDD (major depressive disorder), recurrent, in full remission ([place])  - venlafaxine XR (EFFEXOR-XR) 75 MG 24 hr capsule; Take 3 capsules (225 mg total) by mouth daily.  Dispense: 90 capsule; Refill: 1 - traZODone (DESYREL) 100 MG tablet; Take 1 tablet (100 mg total) by mouth at bedtime as needed for sleep.  Dispense: 30 tablet; Refill: 1 - Discontinue Abilify due to involuntary jaw movements. -Discontinue Trileptal.  2. Alcohol use disorder, moderate, in sustained remission ([place])   3. Posttraumatic stress disorder  - venlafaxine XR (EFFEXOR-XR) 75 MG 24 hr capsule; Take 3 capsules (225 mg total) by mouth daily.  Dispense: 90 capsule; Refill: 1  4. Cocaine use disorder, moderate, in early remission ([place])   F/up in 2 months. Continue out-pt substance abuse recovery program.   [place]evada Crane, MD 12/02/2019, 9:49 AM

## 2019-12-10 DIAGNOSIS — Z720 Tobacco use: Secondary | ICD-10-CM | POA: Diagnosis not present

## 2019-12-23 ENCOUNTER — Ambulatory Visit: Payer: Medicare Other | Admitting: Psychiatry

## 2019-12-31 DIAGNOSIS — E782 Mixed hyperlipidemia: Secondary | ICD-10-CM | POA: Diagnosis not present

## 2019-12-31 DIAGNOSIS — E8881 Metabolic syndrome: Secondary | ICD-10-CM | POA: Diagnosis not present

## 2019-12-31 DIAGNOSIS — J449 Chronic obstructive pulmonary disease, unspecified: Secondary | ICD-10-CM | POA: Diagnosis not present

## 2019-12-31 DIAGNOSIS — I10 Essential (primary) hypertension: Secondary | ICD-10-CM | POA: Diagnosis not present

## 2020-01-12 ENCOUNTER — Other Ambulatory Visit: Payer: Self-pay

## 2020-01-12 ENCOUNTER — Ambulatory Visit (INDEPENDENT_AMBULATORY_CARE_PROVIDER_SITE_OTHER): Payer: Medicare Other | Admitting: Psychiatry

## 2020-01-12 ENCOUNTER — Encounter: Payer: Self-pay | Admitting: Psychiatry

## 2020-01-12 DIAGNOSIS — F1421 Cocaine dependence, in remission: Secondary | ICD-10-CM

## 2020-01-12 DIAGNOSIS — F1021 Alcohol dependence, in remission: Secondary | ICD-10-CM | POA: Diagnosis not present

## 2020-01-12 DIAGNOSIS — F4310 Post-traumatic stress disorder, unspecified: Secondary | ICD-10-CM

## 2020-01-12 DIAGNOSIS — F3342 Major depressive disorder, recurrent, in full remission: Secondary | ICD-10-CM

## 2020-01-12 MED ORDER — VENLAFAXINE HCL ER 75 MG PO CP24: 225.0000 mg | ORAL_CAPSULE | Freq: Every day | ORAL | 0 refills | Status: DC

## 2020-01-12 MED ORDER — TRAZODONE HCL 100 MG PO TABS: 100.0000 mg | ORAL_TABLET | Freq: Every evening | ORAL | 0 refills | Status: DC | PRN

## 2020-01-12 NOTE — Progress Notes (Signed)
[facility] MD OP Progress Note  I connected with  Timesha Brownley on 01/12/20 by a video enabled telemedicine application and verified that I am speaking with the correct person using two identifiers.   I discussed the limitations of evaluation and management by telemedicine. The patient expressed understanding and agreed to proceed.    01/12/2020 2:41 PM Patrisha Mertes  MRN:  [id number]  Chief Complaint: " I am doing well."  HPI: Patient stated that she is continuing her outpatient recovery program.  She has moved out into a personal apartment of her own.  She is getting used to the new routine. She has quit cigarette smoking as well. She is a still staying strong and clean.  She has noticed improvement in her involuntary jaw movements after she stopped Abilify however it is still lingering on.  She was offered [place] to target the same however she stated that she is worried about taking a new medication and then dealing with his potential side effects.  She stated that for now she just cannot wait it out and see how she does over time.  She asked if her dose of Effexor could be reduced.  Writer pointed out that she is undergoing quite a few transitions at this time so would recommend that she continue the same regimen of Effexor to 225 mg daily for now and and maybe we can discuss going down the dose in the near future.   Visit Diagnosis:    ICD-10-CM   1. Cocaine use disorder, moderate, in early remission ([place])  F14.21   2. MDD (major depressive disorder), recurrent, in full remission ([place])  F33.42 venlafaxine XR (EFFEXOR-XR) 75 MG 24 hr capsule    traZODone (DESYREL) 100 MG tablet  3. Posttraumatic stress disorder  F43.10 venlafaxine XR (EFFEXOR-XR) 75 MG 24 hr capsule  4. Alcohol use disorder, moderate, in sustained remission ([place])  F10.21     Past Psychiatric History: MDD, PTSD, substance use disorder in early remission  Past Medical History:  Past Medical History:  Diagnosis Date  .  Alcohol use   . Allergy   . Anxiety   . Asthma   . Childhood schizophrenia ([place])   . Chronic abdominal pain   . Collagen vascular disease ([place])   . COPD (chronic obstructive pulmonary disease) ([place])     Past Surgical History:  Procedure Laterality Date  . ABDOMINAL HYSTERECTOMY    . APPENDECTOMY      Family Psychiatric History: see below  Family History:  Family History  Problem Relation Age of Onset  . Asthma Other   . COPD Other   . Alcoholism Other   . Heart attack Other   . Depression Other   . Mental illness Other   . Alcohol abuse Mother   . Colon cancer Mother 78  . Alcohol abuse Father   . Depression Father     Social History:  Social History   Socioeconomic History  . Marital status: Widowed    Spouse name: Not on file  . Number of children: 5  . Years of education: Not on file  . Highest education level: Some college, no degree  Occupational History  . Not on file  Tobacco Use  . Smoking status: Current Every Day Smoker    Packs/day: 0.25    Types: Cigarettes  . Smokeless tobacco: Never Used  Substance and Sexual Activity  . Alcohol use: No  . Drug use: Yes    Types: Marijuana, Cocaine  . Sexual activity:  Never  Other Topics Concern  . Not on file  Social History Narrative  . Not on file   Social Determinants of Health   Financial Resource Strain:   . Difficulty of Paying Living Expenses: Not on file  Food Insecurity:   . Worried About Charity fundraiser in the Last Year: Not on file  . Ran Out of Food in the Last Year: Not on file  Transportation Needs:   . Lack of Transportation (Medical): Not on file  . Lack of Transportation (Non-Medical): Not on file  Physical Activity:   . Days of Exercise per Week: Not on file  . Minutes of Exercise per Session: Not on file  Stress:   . Feeling of Stress : Not on file  Social Connections:   . Frequency of Communication with Friends and Family: Not on file  . Frequency of Social Gatherings  with Friends and Family: Not on file  . Attends Religious Services: Not on file  . Active Member of Clubs or Organizations: Not on file  . Attends Archivist Meetings: Not on file  . Marital Status: Not on file    Allergies:  Allergies  Allergen Reactions  . Tramadol Nausea And Vomiting    Metabolic Disorder Labs: Lab Results  Component Value Date   HGBA1C 6.2 (H) 06/30/2019   MPG 131.24 06/30/2019   No results found for: PROLACTIN Lab Results  Component Value Date   CHOL 191 06/30/2019   TRIG 132 06/30/2019   HDL 32 (L) 06/30/2019   CHOLHDL 6.0 06/30/2019   VLDL 26 06/30/2019   LDLCALC 133 (H) 06/30/2019   Lab Results  Component Value Date   TSH 6.057 (H) 06/30/2019    Therapeutic Level Labs: No results found for: LITHIUM No results found for: VALPROATE No components found for:  CBMZ  Current Medications: Current Outpatient Medications  Medication Sig Dispense Refill  . budesonide-formoterol (SYMBICORT) 160-4.5 MCG/ACT inhaler Inhale 2 puffs into the lungs 2 (two) times daily.    Marland Kitchen ipratropium-albuterol (DUONEB) 0.5-2.5 (3) MG/3ML SOLN Inhale 3 mLs into the lungs every 6 (six) hours as needed (Shortness of Breath).   2  . lisinopril (ZESTRIL) 10 MG tablet Take 1 tablet (10 mg total) by mouth daily. 30 tablet 0  . polyethylene glycol-electrolytes (TRILYTE) 420 g solution Take 4,000 mLs by mouth as directed. 4000 mL 0  . traZODone (DESYREL) 100 MG tablet Take 1 tablet (100 mg total) by mouth at bedtime as needed for sleep. 90 tablet 0  . venlafaxine XR (EFFEXOR-XR) 75 MG 24 hr capsule Take 3 capsules (225 mg total) by mouth daily. 270 capsule 0  . VENTOLIN HFA 108 (90 BASE) MCG/ACT inhaler Inhale 1 puff into the lungs every 6 (six) hours as needed for shortness of breath.   2   No current facility-administered medications for this visit.     Musculoskeletal: Strength & Muscle Tone: unable to assess due to telemed visit Gait & Station: unable to assess  due to telemed visit Patient leans: unable to assess due to telemed visit  Psychiatric Specialty Exam: Review of Systems  There were no vitals taken for this visit.There is no height or weight on file to calculate BMI.  General Appearance: Well Groomed  Eye Contact:  Good  Speech:  Clear and Coherent and Normal Rate  Volume:  Normal  Mood:  Euthymic  Affect:  Congruent  Thought Process:  Goal Directed, Linear and Descriptions of Associations: Intact  Orientation:  Full (Time, Place, and Person)  Thought Content: Logical   Suicidal Thoughts:  No  Homicidal Thoughts:  No  Memory:  Recent;   Good Remote;   Good  Judgement:  Fair  Insight:  Fair  Psychomotor Activity:  Involunatry movements of lower jaw noted  Concentration:  Concentration: Good  Recall:  Good  Fund of Knowledge: Good  Language: Good  Akathisia:  Negative  Handed:  Right  AIMS (if indicated): involuntary lower jaw movements present  Assets:  Communication Skills Desire for Improvement Financial Resources/Insurance Housing Social Support  ADL's:  Intact  Cognition: WNL  Sleep:  Good   Screenings: AIMS     Admission (Discharged) from 06/29/2019 in [facility]  AIMS Total Score  0    AUDIT     Admission (Discharged) from 06/29/2019 in [place] [facility]  Alcohol Use Disorder Identification Test Final Score (AUDIT)  0       Assessment and Plan: 65 year old female with history of MDD, PTSD, substance use disorder now in remission seen for follow-up.  Patient appears to be stable on her current regimen.  Recommend continuing Effexor and trazodone at the same dose.  1. MDD (major depressive disorder), recurrent, in full remission ([place])  - venlafaxine XR (EFFEXOR-XR) 75 MG 24 hr capsule; Take 3 capsules (225 mg total) by mouth daily.  Dispense: 90 capsule; Refill: 1 - traZODone (DESYREL) 100 MG tablet; Take 1 tablet (100 mg total) by mouth at  bedtime as needed for sleep.  Dispense: 30 tablet; Refill: 1  2. Alcohol use disorder, moderate, in sustained remission ([place])   3. Posttraumatic stress disorder  - venlafaxine XR (EFFEXOR-XR) 75 MG 24 hr capsule; Take 3 capsules (225 mg total) by mouth daily.  Dispense: 90 capsule; Refill: 1  4. Cocaine use disorder, moderate, in early remission ([place])   F/up in 2 months. Continue out-pt substance abuse recovery program.   [place] Crane, MD 01/12/2020, 2:41 PM

## 2020-01-24 ENCOUNTER — Ambulatory Visit (HOSPITAL_COMMUNITY): Payer: Medicare Other | Admitting: Psychiatry

## 2020-02-29 NOTE — Progress Notes (Signed)
Virtual Visit via Video Note  I connected with Jasmine Werner on 03/06/20 at  2:50 PM EDT by a video enabled telemedicine application and verified that I am speaking with the correct person using two identifiers.   I discussed the limitations of evaluation and management by telemedicine and the availability of in person appointments. The patient expressed understanding and agreed to proceed.     I discussed the assessment and treatment plan with the patient. The patient was provided an opportunity to ask questions and all were answered. The patient agreed with the plan and demonstrated an understanding of the instructions.   The patient was advised to call back or seek an in-person evaluation if the symptoms worsen or if the condition fails to improve as anticipated.  I provided 20 minutes of non-face-to-face time during this encounter.   [name], MD    [facility] MD/PA/NP OP Progress Note  03/06/2020 3:28 PM [name]  MRN:  [id number]  Chief Complaint:  Chief Complaint    Depression; Other; Follow-up     HPI:  - She was discharged from [facility], and did outpatient recovery program - Trileptal , Abilify (involuntary jaw movement) were discontinued at the visit with Dr. Toy Care This is a follow-up appointment for depression, anxiety, and substance use disorder in early remission.   She states that she feels more anxious and depressed lately.  She believes it is "seasonal" as she tends to have yearly cycle. She is not aware of any triggers. She completed a program at [facility], and moved into the apartment. She moved out from her previous place as she did not have any association with people who uses drugs. She has gotten a car, and her children have been helpful while they are busy with their own family. She talks with her sponsor every day, and attends [place] meeting.  She has been unemployed for many year, and is not thinking of getting a new job at this time.  She talks about her  neighbor who has cancer.  She talks with this lady and helps her as needed. She denies insomnia. She feels fatigue. She has mild anhedonia. She has fair concentration. She denies SI. She feels anxious, tense and has occasional panic attacks. She has fair appetite. She has not drink alcohol or use drug since 06/27/2019. She denies any craving. She believes ativan helped her in the past, and would like to have this medication.   Visit Diagnosis:    ICD-10-CM   1. MDD (major depressive disorder), recurrent episode, mild ([place])  F33.0   2. Alcohol use disorder, moderate, in sustained remission (HCC)  F10.21   3. Substance use disorder  F19.90   4. Insomnia, unspecified type  G47.00     Past Psychiatric History: Please see initial evaluation for full details. I have reviewed the history. No updates at this time.     Past Medical History:  Past Medical History:  Diagnosis Date  . Alcohol use   . Allergy   . Anxiety   . Asthma   . Childhood schizophrenia ([place])   . Chronic abdominal pain   . Collagen vascular disease ([place])   . COPD (chronic obstructive pulmonary disease) ([place])     Past Surgical History:  Procedure Laterality Date  . ABDOMINAL HYSTERECTOMY    . APPENDECTOMY      Family Psychiatric History: Please see initial evaluation for full details. I have reviewed the history. No updates at this time.     Family  History:  Family History  Problem Relation Age of Onset  . Asthma Other   . COPD Other   . Alcoholism Other   . Heart attack Other   . Depression Other   . Mental illness Other   . Alcohol abuse Mother   . Colon cancer Mother 39  . Alcohol abuse Father   . Depression Father     Social History:  Social History   Socioeconomic History  . Marital status: Widowed    Spouse name: Not on file  . Number of children: 5  . Years of education: Not on file  . Highest education level: Some college, no degree  Occupational History  . Not on file  Tobacco Use  .  Smoking status: Current Every Day Smoker    Packs/day: 0.25    Types: Cigarettes  . Smokeless tobacco: Never Used  Substance and Sexual Activity  . Alcohol use: No  . Drug use: Yes    Types: Marijuana, Cocaine  . Sexual activity: Never  Other Topics Concern  . Not on file  Social History Narrative  . Not on file   Social Determinants of Health   Financial Resource Strain:   . Difficulty of Paying Living Expenses:   Food Insecurity:   . Worried About Charity fundraiser in the Last Year:   . Arboriculturist in the Last Year:   Transportation Needs:   . Film/video editor (Medical):   Marland Kitchen Lack of Transportation (Non-Medical):   Physical Activity:   . Days of Exercise per Week:   . Minutes of Exercise per Session:   Stress:   . Feeling of Stress :   Social Connections:   . Frequency of Communication with Friends and Family:   . Frequency of Social Gatherings with Friends and Family:   . Attends Religious Services:   . Active Member of Clubs or Organizations:   . Attends Archivist Meetings:   Marland Kitchen Marital Status:     Allergies:  Allergies  Allergen Reactions  . Tramadol Nausea And Vomiting    Metabolic Disorder Labs: Lab Results  Component Value Date   HGBA1C 6.2 (H) 06/30/2019   MPG 131.24 06/30/2019   No results found for: PROLACTIN Lab Results  Component Value Date   CHOL 191 06/30/2019   TRIG 132 06/30/2019   HDL 32 (L) 06/30/2019   CHOLHDL 6.0 06/30/2019   VLDL 26 06/30/2019   LDLCALC 133 (H) 06/30/2019   Lab Results  Component Value Date   TSH 6.057 (H) 06/30/2019    Therapeutic Level Labs: No results found for: LITHIUM No results found for: VALPROATE No components found for:  CBMZ  Current Medications: Current Outpatient Medications  Medication Sig Dispense Refill  . budesonide-formoterol (SYMBICORT) 160-4.5 MCG/ACT inhaler Inhale 2 puffs into the lungs 2 (two) times daily.    . busPIRone (BUSPAR) 5 MG tablet Take 1 tablet (5 mg  total) by mouth 2 (two) times daily. 60 tablet 0  . ipratropium-albuterol (DUONEB) 0.5-2.5 (3) MG/3ML SOLN Inhale 3 mLs into the lungs every 6 (six) hours as needed (Shortness of Breath).   2  . lisinopril (ZESTRIL) 10 MG tablet Take 1 tablet (10 mg total) by mouth daily. 30 tablet 0  . polyethylene glycol-electrolytes (TRILYTE) 420 g solution Take 4,000 mLs by mouth as directed. 4000 mL 0  . traZODone (DESYREL) 100 MG tablet Take 1 tablet (100 mg total) by mouth at bedtime as needed for sleep. 90 tablet  0  . venlafaxine XR (EFFEXOR-XR) 75 MG 24 hr capsule Take 3 capsules (225 mg total) by mouth daily. 270 capsule 0  . VENTOLIN HFA 108 (90 BASE) MCG/ACT inhaler Inhale 1 puff into the lungs every 6 (six) hours as needed for shortness of breath.   2   No current facility-administered medications for this visit.     Musculoskeletal: Strength & Muscle Tone: N/A Gait & Station: N/A Patient leans: N/A  Psychiatric Specialty Exam: Review of Systems  Psychiatric/Behavioral: Positive for dysphoric mood. Negative for agitation, behavioral problems, confusion, decreased concentration, hallucinations, self-injury, sleep disturbance and suicidal ideas. The patient is nervous/anxious. The patient is not hyperactive.   All other systems reviewed and are negative.   There were no vitals taken for this visit.There is no height or weight on file to calculate BMI.  General Appearance: Fairly Groomed  Eye Contact:  Good  Speech:  Clear and Coherent  Volume:  Normal  Mood:  Depressed  Affect:  Appropriate, Congruent and slightly down, but reactive  Thought Process:  Coherent  Orientation:  Full (Time, Place, and Person)  Thought Content: Logical   Suicidal Thoughts:  No  Homicidal Thoughts:  No  Memory:  Immediate;   Good  Judgement:  Good  Insight:  Fair  Psychomotor Activity:  Normal  Concentration:  Concentration: Good and Attention Span: Good  Recall:  Good  Fund of Knowledge: Good  Language:  Good  Akathisia:  No  Handed:  Right  AIMS (if indicated): not done  Assets:  Communication Skills Desire for Improvement  ADL's:  Intact  Cognition: WNL  Sleep:  Good   Screenings: AIMS     Admission (Discharged) from 06/29/2019 in [place] [facility]  AIMS Total Score  0    AUDIT     Admission (Discharged) from 06/29/2019 in [place] [facility]  Alcohol Use Disorder Identification Test Final Score (AUDIT)  0       Assessment and Plan:  Leya Plybon is a 64 y.o. year old female with a history of depression, cocaine use disorder, COPD,type II diabetes, who presents for follow up appointment for MDD (major depressive disorder), recurrent episode, mild (HCC)  Alcohol use disorder, moderate, in sustained remission ([place])  Substance use disorder  Insomnia, unspecified type  # Cocaine use disorder # Marijuana use disorder She has been abstinent from any substance use since July 26th, 2020. She denies any craving. She attends virtual AA meeting every day and talks with sponsor. Will continue motivational interview.   She is at residential facility for substance use (until late Dec), and is at action phase for sobriety. Will continue motivational interview.   # MDD, recurrent, mild with anxiety She reports slight worsening depressive symptoms and anxiety since her last visit without significant triggers.  Will add BuSpar to target anxiety.  Will continue venlafaxine to target depression.  Will consider bupropion in the future (she does not recall if this medication caused adverse reaction) as adjunctive treatment of depression. Discussed with the patient that benzodiazepine will NOT be prescribed given its risk of dependence/her history of substance use.  # Insomnia She reports good benefit from trazodone.  Will continue trazodone for insomnia.    Plan I have reviewed and updated plans as below 1.ContinueEffexor225mg   daily  2.Start Buspar 5 mg twice a day  3. ContinueTrazodone 100 mg at night as needed for sleep 4. Next appointment:5/12 at 10 AM for 20 mins, video  Past  trials of medication: sertraline (memory loss),  ?bupropion (she felt "horns come out of my head), lorazepam, clonazepam, lithium, prolixin, haldol, Abilify (jaw movement), oxcarbazepine  The patient demonstrates the following risk factors for suicide: Chronic risk factors for suicide include:psychiatric disorder ofdepression. Acute risk factorsfor suicide include: unemployment. Protective factorsfor this patient include: responsibility to others (children, family), coping skills and hope for the future. Considering these factors, the overall suicide risk at this point appears to bemoderate, but not at imminent risk to self. Patientisappropriate for outpatient follow up. She denies gun access at home.  [name], MD 03/06/2020, 3:28 PM

## 2020-03-06 ENCOUNTER — Ambulatory Visit (INDEPENDENT_AMBULATORY_CARE_PROVIDER_SITE_OTHER): Payer: Medicare Other | Admitting: Psychiatry

## 2020-03-06 ENCOUNTER — Other Ambulatory Visit: Payer: Self-pay

## 2020-03-06 ENCOUNTER — Encounter (HOSPITAL_COMMUNITY): Payer: Self-pay | Admitting: Psychiatry

## 2020-03-06 DIAGNOSIS — G4700 Insomnia, unspecified: Secondary | ICD-10-CM

## 2020-03-06 DIAGNOSIS — F1021 Alcohol dependence, in remission: Secondary | ICD-10-CM | POA: Diagnosis not present

## 2020-03-06 DIAGNOSIS — F330 Major depressive disorder, recurrent, mild: Secondary | ICD-10-CM

## 2020-03-06 DIAGNOSIS — F1990 Other psychoactive substance use, unspecified, uncomplicated: Secondary | ICD-10-CM

## 2020-03-06 MED ORDER — BUSPIRONE HCL 5 MG PO TABS: 5.0000 mg | ORAL_TABLET | Freq: Two times a day (BID) | ORAL | 0 refills | Status: DC

## 2020-03-06 NOTE — Patient Instructions (Signed)
1.ContinueEffexor225mg  daily  2.Start Buspar 5 mg twice a day  3. ContinueTrazodone 100 mg at night as needed for sleep 4. Next appointment:5/12 at 10 AM

## 2020-03-21 DIAGNOSIS — F17210 Nicotine dependence, cigarettes, uncomplicated: Secondary | ICD-10-CM | POA: Diagnosis not present

## 2020-03-21 DIAGNOSIS — I10 Essential (primary) hypertension: Secondary | ICD-10-CM | POA: Diagnosis not present

## 2020-03-21 DIAGNOSIS — E1165 Type 2 diabetes mellitus with hyperglycemia: Secondary | ICD-10-CM | POA: Diagnosis not present

## 2020-03-21 DIAGNOSIS — E782 Mixed hyperlipidemia: Secondary | ICD-10-CM | POA: Diagnosis not present

## 2020-03-21 DIAGNOSIS — J441 Chronic obstructive pulmonary disease with (acute) exacerbation: Secondary | ICD-10-CM | POA: Diagnosis not present

## 2020-03-21 DIAGNOSIS — R5383 Other fatigue: Secondary | ICD-10-CM | POA: Diagnosis not present

## 2020-03-28 DIAGNOSIS — Z23 Encounter for immunization: Secondary | ICD-10-CM | POA: Diagnosis not present

## 2020-03-28 DIAGNOSIS — J449 Chronic obstructive pulmonary disease, unspecified: Secondary | ICD-10-CM | POA: Diagnosis not present

## 2020-03-28 DIAGNOSIS — E1165 Type 2 diabetes mellitus with hyperglycemia: Secondary | ICD-10-CM | POA: Diagnosis not present

## 2020-03-28 DIAGNOSIS — Z0001 Encounter for general adult medical examination with abnormal findings: Secondary | ICD-10-CM | POA: Diagnosis not present

## 2020-03-28 DIAGNOSIS — E782 Mixed hyperlipidemia: Secondary | ICD-10-CM | POA: Diagnosis not present

## 2020-03-28 DIAGNOSIS — F331 Major depressive disorder, recurrent, moderate: Secondary | ICD-10-CM | POA: Diagnosis not present

## 2020-03-28 DIAGNOSIS — M791 Myalgia, unspecified site: Secondary | ICD-10-CM | POA: Diagnosis not present

## 2020-03-28 DIAGNOSIS — F17210 Nicotine dependence, cigarettes, uncomplicated: Secondary | ICD-10-CM | POA: Diagnosis not present

## 2020-04-06 NOTE — Progress Notes (Signed)
Virtual Visit via Video Note  I connected with Jasmine Werner on 04/12/20 at 10:00 AM EDT by a video enabled telemedicine application and verified that I am speaking with the correct person using two identifiers.   I discussed the limitations of evaluation and management by telemedicine and the availability of in person appointments. The patient expressed understanding and agreed to proceed.    I discussed the assessment and treatment plan with the patient. The patient was provided an opportunity to ask questions and all were answered. The patient agreed with the plan and demonstrated an understanding of the instructions.   The patient was advised to call back or seek an in-person evaluation if the symptoms worsen or if the condition fails to improve as anticipated.  I provided 15 minutes of non-face-to-face time during this encounter.   [name], MD    [facility] MD/PA/NP OP Progress Note  04/12/2020 10:25 AM Jasmine Werner  MRN:  [id number]  Chief Complaint:  Chief Complaint    Depression; Follow-up     HPI:  This is a follow-up appointment for depression.  She states that she has been feeling more anxious.  She also reports that she had her time on Mother's Day.  She was stressed and felt down, referring that her child was taken from her at age 8. She had thought of binge eating. She went to church that day, and called her sponsor next day.  She reports good support from her sponsor and her friend.  She is currently at the beach with her friend, who also goes to NA meeting. She has not started buspar as she did not think it was effective when she tried years ago. She wants ativan to be prescribed, although she understands that this medication will not be prescribed.  She has insomnia and hypersomnia.  She takes a nap.  She feels fatigue.  She gained 25 pounds since last June.  She denies SI or SIB.  She did have craving for cocaine and marijuana, although she denies any use. She denies  panic attacks.   Routine- read, watch TV, plays with her cat,   190 lbs, 25 pounds weight gain since last June Wt Readings from Last 3 Encounters:  06/28/19 170 lb (77.1 kg)  05/26/19 190 lb (86.2 kg)  12/25/18 216 lb (98 kg)      Visit Diagnosis:    ICD-10-CM   1. MDD (major depressive disorder), recurrent episode, mild (HCC)  F33.0 venlafaxine XR (EFFEXOR-XR) 75 MG 24 hr capsule    traZODone (DESYREL) 100 MG tablet  2. Substance use disorder  F19.90   3. Insomnia, unspecified type  G47.00 Ambulatory referral to Neurology    Past Psychiatric History: Please see initial evaluation for full details. I have reviewed the history. No updates at this time.     Past Medical History:  Past Medical History:  Diagnosis Date  . Alcohol use   . Allergy   . Anxiety   . Asthma   . Childhood schizophrenia ([place])   . Chronic abdominal pain   . Collagen vascular disease ([place])   . COPD (chronic obstructive pulmonary disease) ([place])     Past Surgical History:  Procedure Laterality Date  . ABDOMINAL HYSTERECTOMY    . APPENDECTOMY      Family Psychiatric History: Please see initial evaluation for full details. I have reviewed the history. No updates at this time.     Family History:  Family History  Problem Relation Age of Onset  .  Asthma Other   . COPD Other   . Alcoholism Other   . Heart attack Other   . Depression Other   . Mental illness Other   . Alcohol abuse Mother   . Colon cancer Mother 29  . Alcohol abuse Father   . Depression Father     Social History:  Social History   Socioeconomic History  . Marital status: Widowed    Spouse name: Not on file  . Number of children: 5  . Years of education: Not on file  . Highest education level: Some college, no degree  Occupational History  . Not on file  Tobacco Use  . Smoking status: Current Every Day Smoker    Packs/day: 0.25    Types: Cigarettes  . Smokeless tobacco: Never Used  Substance and Sexual Activity   . Alcohol use: No  . Drug use: Yes    Types: Marijuana, Cocaine  . Sexual activity: Never  Other Topics Concern  . Not on file  Social History Narrative  . Not on file   Social Determinants of Health   Financial Resource Strain:   . Difficulty of Paying Living Expenses:   Food Insecurity:   . Worried About Charity fundraiser in the Last Year:   . Arboriculturist in the Last Year:   Transportation Needs:   . Film/video editor (Medical):   Marland Kitchen Lack of Transportation (Non-Medical):   Physical Activity:   . Days of Exercise per Week:   . Minutes of Exercise per Session:   Stress:   . Feeling of Stress :   Social Connections:   . Frequency of Communication with Friends and Family:   . Frequency of Social Gatherings with Friends and Family:   . Attends Religious Services:   . Active Member of Clubs or Organizations:   . Attends Archivist Meetings:   Marland Kitchen Marital Status:     Allergies:  Allergies  Allergen Reactions  . Tramadol Nausea And Vomiting    Metabolic Disorder Labs: Lab Results  Component Value Date   HGBA1C 6.2 (H) 06/30/2019   MPG 131.24 06/30/2019   No results found for: PROLACTIN Lab Results  Component Value Date   CHOL 191 06/30/2019   TRIG 132 06/30/2019   HDL 32 (L) 06/30/2019   CHOLHDL 6.0 06/30/2019   VLDL 26 06/30/2019   LDLCALC 133 (H) 06/30/2019   Lab Results  Component Value Date   TSH 6.057 (H) 06/30/2019    Therapeutic Level Labs: No results found for: LITHIUM No results found for: VALPROATE No components found for:  CBMZ  Current Medications: Current Outpatient Medications  Medication Sig Dispense Refill  . budesonide-formoterol (SYMBICORT) 160-4.5 MCG/ACT inhaler Inhale 2 puffs into the lungs 2 (two) times daily.    Marland Kitchen ipratropium-albuterol (DUONEB) 0.5-2.5 (3) MG/3ML SOLN Inhale 3 mLs into the lungs every 6 (six) hours as needed (Shortness of Breath).   2  . lisinopril (ZESTRIL) 10 MG tablet Take 1 tablet (10 mg  total) by mouth daily. 30 tablet 0  . polyethylene glycol-electrolytes (TRILYTE) 420 g solution Take 4,000 mLs by mouth as directed. 4000 mL 0  . traZODone (DESYREL) 100 MG tablet Take 1 tablet (100 mg total) by mouth at bedtime as needed for sleep. 90 tablet 0  . venlafaxine XR (EFFEXOR-XR) 75 MG 24 hr capsule Take 3 capsules (225 mg total) by mouth daily. 270 capsule 0  . VENTOLIN HFA 108 (90 BASE) MCG/ACT inhaler Inhale 1  puff into the lungs every 6 (six) hours as needed for shortness of breath.   2   No current facility-administered medications for this visit.     Musculoskeletal: Strength & Muscle Tone: N/A Gait & Station: N/A Patient leans: N/A  Psychiatric Specialty Exam: Review of Systems  Psychiatric/Behavioral: Positive for sleep disturbance. Negative for agitation, behavioral problems, confusion, decreased concentration, dysphoric mood, hallucinations, self-injury and suicidal ideas. The patient is nervous/anxious. The patient is not hyperactive.   All other systems reviewed and are negative.   There were no vitals taken for this visit.There is no height or weight on file to calculate BMI.  General Appearance: Fairly Groomed  Eye Contact:  Good  Speech:  Clear and Coherent  Volume:  Normal  Mood:  Anxious  Affect:  Appropriate, Congruent and euthymic  Thought Process:  Coherent  Orientation:  Full (Time, Place, and Person)  Thought Content: Logical   Suicidal Thoughts:  No  Homicidal Thoughts:  No  Memory:  Immediate;   Good  Judgement:  Good  Insight:  Good  Psychomotor Activity:  Normal  Concentration:  Concentration: Good and Attention Span: Good  Recall:  Good  Fund of Knowledge: Good  Language: Good  Akathisia:  No  Handed:  Right  AIMS (if indicated): not done  Assets:  Communication Skills Desire for Improvement  ADL's:  Intact  Cognition: WNL  Sleep:  hypersomnia   Screenings: AIMS     Admission (Discharged) from 06/29/2019 in [place] [facility]  AIMS Total Score  0    AUDIT     Admission (Discharged) from 06/29/2019 in [place] [facility]  Alcohol Use Disorder Identification Test Final Score (AUDIT)  0       Assessment and Plan:  Jasmine Werner is a 64 y.o. year old female with a history of depression, cocaine use disorder,COPD,type II diabetes , who presents for follow up appointment for MDD (major depressive disorder), recurrent episode, mild ([place]) - Plan: venlafaxine XR (EFFEXOR-XR) 75 MG 24 hr capsule, traZODone (DESYREL) 100 MG tablet  Substance use disorder  Insomnia, unspecified type - Plan: Ambulatory referral to Neurology  # Cocaine use disorder # Marijuana use disorder Although she reports occasional craving for substance, she has been abstinent since July 26th, 2020. She attends virtual AA meeting and talks with her sponsor.  Will continue motivational interview.   # MDD, recurrent with mild anxiety  She reports occasional worsening in depressive symptoms and anxiety in the context of Mother's Day, missing her daughter.  Although it was discussed to add Buspar, she was not amenable to this plan.  Discussed with the patient that benzodiazepines will not be prescribed given his risk of dependence and her history of substance use.  Will continue venlafaxine to target depression.  Will consider adjunctive treatment in the future if there is any worsening in her mood symptoms.   # Insomnia She continues to have insomnia and daytime fatigue. Will make referral to rule out sleep apnea.    Plan I have reviewed and updated plans as below 1.ContinueEffexor225mg  daily  2. Discontinue Buspar- she did not try this medication 3. ContinueTrazodone100mg  at night as needed for sleep 4.Next appointment:6/21 at 11:30  for 30 mins, video 5. Referral for sleep evaluation  Past trials of medication:sertraline (memory loss),?bupropion (she felt "horns come out of  my head), lorazepam, clonazepam, lithium, prolixin, haldol, Abilify (jaw movement), oxcarbazepine  The patient demonstrates the following risk factors for suicide:  Chronic risk factors for suicide include:psychiatric disorder ofdepression. Acute risk factorsfor suicide include: unemployment. Protective factorsfor this patient include: responsibility to others (children, family), coping skills and hope for the future. Considering these factors, the overall suicide risk at this point appears to bemoderate, but not at imminent risk to self. Patientisappropriate for outpatient follow up. She denies gun access at home.  [name], MD 04/12/2020, 10:24 AM

## 2020-04-12 ENCOUNTER — Telehealth (INDEPENDENT_AMBULATORY_CARE_PROVIDER_SITE_OTHER): Payer: Medicare Other | Admitting: Psychiatry

## 2020-04-12 ENCOUNTER — Encounter (HOSPITAL_COMMUNITY): Payer: Self-pay | Admitting: Psychiatry

## 2020-04-12 ENCOUNTER — Other Ambulatory Visit: Payer: Self-pay

## 2020-04-12 DIAGNOSIS — F1990 Other psychoactive substance use, unspecified, uncomplicated: Secondary | ICD-10-CM | POA: Diagnosis not present

## 2020-04-12 DIAGNOSIS — F330 Major depressive disorder, recurrent, mild: Secondary | ICD-10-CM

## 2020-04-12 DIAGNOSIS — G4700 Insomnia, unspecified: Secondary | ICD-10-CM

## 2020-04-12 MED ORDER — VENLAFAXINE HCL ER 75 MG PO CP24: 225.0000 mg | ORAL_CAPSULE | Freq: Every day | ORAL | 0 refills | Status: DC

## 2020-04-12 MED ORDER — TRAZODONE HCL 100 MG PO TABS: 100.0000 mg | ORAL_TABLET | Freq: Every evening | ORAL | 0 refills | Status: DC | PRN

## 2020-04-12 NOTE — Patient Instructions (Signed)
1.ContinueEffexor225mg  daily  2. Discontinue Buspar- she did not try this medication 3. ContinueTrazodone100mg  at night as needed for sleep 4.Next appointment:6/21 at 11:30  for 30 mins, video 5. Referral for sleep evaluation

## 2020-04-20 DIAGNOSIS — Z1211 Encounter for screening for malignant neoplasm of colon: Secondary | ICD-10-CM | POA: Diagnosis not present

## 2020-04-20 DIAGNOSIS — Z800 Family history of malignant neoplasm of digestive organs: Secondary | ICD-10-CM | POA: Diagnosis not present

## 2020-04-25 ENCOUNTER — Encounter: Payer: Self-pay | Admitting: Neurology

## 2020-04-25 ENCOUNTER — Other Ambulatory Visit: Payer: Self-pay

## 2020-04-25 ENCOUNTER — Ambulatory Visit (INDEPENDENT_AMBULATORY_CARE_PROVIDER_SITE_OTHER): Payer: Medicare Other | Admitting: Neurology

## 2020-04-25 ENCOUNTER — Institutional Professional Consult (permissible substitution): Payer: Medicare Other | Admitting: Neurology

## 2020-04-25 VITALS — BP 114/72 | HR 102 | Ht 65.0 in | Wt 190.0 lb

## 2020-04-25 DIAGNOSIS — F39 Unspecified mood [affective] disorder: Secondary | ICD-10-CM

## 2020-04-25 DIAGNOSIS — E669 Obesity, unspecified: Secondary | ICD-10-CM | POA: Diagnosis not present

## 2020-04-25 DIAGNOSIS — Z820 Family history of epilepsy and other diseases of the nervous system: Secondary | ICD-10-CM

## 2020-04-25 DIAGNOSIS — G4719 Other hypersomnia: Secondary | ICD-10-CM

## 2020-04-25 NOTE — Progress Notes (Signed)
Subjective:    Patient ID: Jasmine Werner is a 64 y.o. female.  HPI     Star Age, MD, PhD [facility] [address] [place], [place] [address]  Dear Dr. Modesta Messing,   I saw your patient, Jasmine Werner, upon your kind request, in my sleep clinic today for Initial consultation of her sleep disorder, in particular, concern for underlying obstructive sleep apnea.  The patient is unaccompanied today.  As you know, Jasmine Werner is a 64 year old right-handed woman with an underlying medical history of COPD, smoking, mood disorder, substance use disorder and obesity, who reports excessive daytime somnolence.  She is not sure if she snores.  She lives alone, her husband passed away in the early 77s.  She has 5 grown children.  She reports feeling sleepy during the day, goes to bed around 9, takes her trazodone at 9 and also takes melatonin 5 or 10 mg each night.  She is not sure how long it takes for her to fall asleep.  She has 1 cat in the household, rise time generally around 8 AM.  She does not have night to night nocturia and denies any recurrent morning headaches.  Her brother has sleep apnea and uses a CPAP machine.  She is in the process of smoking cessation, reports that she smokes about 1 or 2 cigarettes/day.  She drinks no alcohol and denies any illicit drug use.  Caffeine is about 1 large mug per day, she estimates that this is about 2 regular cups.  She has an Epworth sleepiness score of 15 out of 24, fatigue severity score of 34 out of 63.  She reports that she naps in the afternoons about 2 hours.  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  She would be willing to get evaluated for sleep apnea and consider CPAP therapy.   Her Past Medical History Is Significant For: Past Medical History:  Diagnosis Date  . Alcohol use   . Allergy   . Anxiety   . Asthma   . Childhood schizophrenia ([place])   . Chronic abdominal pain   . Collagen  vascular disease ([place])   . COPD (chronic obstructive pulmonary disease) ([place])     Her Past Surgical History Is Significant For: Past Surgical History:  Procedure Laterality Date  . ABDOMINAL HYSTERECTOMY    . APPENDECTOMY      Her Family History Is Significant For: Family History  Problem Relation Age of Onset  . Asthma Other   . COPD Other   . Alcoholism Other   . Heart attack Other   . Depression Other   . Mental illness Other   . Alcohol abuse Mother   . Colon cancer Mother 18  . Alcohol abuse Father   . Depression Father     Her Social History Is Significant For: Social History   Socioeconomic History  . Marital status: Widowed    Spouse name: Not on file  . Number of children: 5  . Years of education: Not on file  . Highest education level: Some college, no degree  Occupational History  . Not on file  Tobacco Use  . Smoking status: Current Every Day Smoker    Packs/day: 0.25    Types: Cigarettes  . Smokeless tobacco: Never Used  Substance and Sexual Activity  . Alcohol use: No  . Drug use: Yes    Types: Marijuana, Cocaine  . Sexual activity: Never  Other Topics  Concern  . Not on file  Social History Narrative  . Not on file   Social Determinants of Health   Financial Resource Strain:   . Difficulty of Paying Living Expenses:   Food Insecurity:   . Worried About Charity fundraiser in the Last Year:   . Arboriculturist in the Last Year:   Transportation Needs:   . Film/video editor (Medical):   Marland Kitchen Lack of Transportation (Non-Medical):   Physical Activity:   . Days of Exercise per Week:   . Minutes of Exercise per Session:   Stress:   . Feeling of Stress :   Social Connections:   . Frequency of Communication with Friends and Family:   . Frequency of Social Gatherings with Friends and Family:   . Attends Religious Services:   . Active Member of Clubs or Organizations:   . Attends Archivist Meetings:   Marland Kitchen Marital Status:      Her Allergies Are:  Allergies  Allergen Reactions  . Tramadol Nausea And Vomiting  :   Her Current Medications Are:  Outpatient Encounter Medications as of 04/25/2020  Medication Sig  . ASPIRIN 81 PO Take by mouth.  . budesonide-formoterol (SYMBICORT) 160-4.5 MCG/ACT inhaler Inhale 2 puffs into the lungs 2 (two) times daily.  Marland Kitchen ipratropium-albuterol (DUONEB) 0.5-2.5 (3) MG/3ML SOLN Inhale 3 mLs into the lungs every 6 (six) hours as needed (Shortness of Breath).   Marland Kitchen lisinopril (ZESTRIL) 10 MG tablet Take 1 tablet (10 mg total) by mouth daily.  Marland Kitchen MELATONIN PO Take by mouth.  . rosuvastatin (CRESTOR) 5 MG tablet Take 5 mg by mouth daily.  . traZODone (DESYREL) 100 MG tablet Take 1 tablet (100 mg total) by mouth at bedtime as needed for sleep.  . varenicline (CHANTIX PAK) 0.5 MG X 11 & 1 MG X 42 tablet Take by mouth 2 (two) times daily.  Marland Kitchen venlafaxine XR (EFFEXOR-XR) 75 MG 24 hr capsule Take 3 capsules (225 mg total) by mouth daily.  . VENTOLIN HFA 108 (90 BASE) MCG/ACT inhaler Inhale 1 puff into the lungs every 6 (six) hours as needed for shortness of breath.   . polyethylene glycol-electrolytes (TRILYTE) 420 g solution Take 4,000 mLs by mouth as directed. (Patient not taking: Reported on 04/25/2020)   No facility-administered encounter medications on file as of 04/25/2020.  :  Review of Systems:  Out of a complete 14 point review of systems, all are reviewed and negative with the exception of these symptoms as listed below: Review of Systems  Neurological:       Here for sleep consult. No prior sleep study. Pt reports she typically sleeps for 12 hours at night and then will be fatigue throughout the day. Pt does not think she snores.  Epworth Sleepiness Scale 0= would never doze 1= slight chance of dozing 2= moderate chance of dozing 3= high chance of dozing  Sitting and reading:1 Watching TV:2 Sitting inactive in a public place (ex. Theater or meeting):2 As a passenger in a  car for an hour without a break:3 Lying down to rest in the afternoon:3 Sitting and talking to someone:1 Sitting quietly after lunch (no alcohol):3 In a car, while stopped in traffic:0 Total:15    Objective:  Neurological Exam  Physical Exam Physical Examination:   Vitals:   04/25/20 1103  BP: 114/72  Pulse: (!) 102    General Examination: The patient is a very pleasant 64 y.o. female in no acute distress.  She appears well-developed and well-nourished and well groomed.   HEENT: Normocephalic, atraumatic, pupils are equal, round and reactive to light, extraocular tracking is good without limitation to gaze excursion or nystagmus noted. Hearing is grossly intact. Face is symmetric with normal facial animation. Speech is clear with no dysarthria noted. There is no hypophonia. There is no lip, neck/head, jaw or voice tremor. Neck is supple with full range of passive and active motion. There are no carotid bruits on auscultation. Oropharynx exam reveals: mild mouth dryness, adequate dental hygiene, with several missing teeth, she reports that she has a partial denture for the top and bottom but forgot to put them in today.  Moderate airway crowding is noted secondary to tonsillar size of 2-3+ and small airway entry, somewhat redundant soft palate.  Neck circumference of about 16 inches.  Tongue protrudes centrally in palate elevates symmetrically.  She has a mild to moderate overbite.  Chest: Clear to auscultation without wheezing, rhonchi or crackles noted.  Heart: S1+S2+0, regular and normal without murmurs, rubs or gallops noted.   Abdomen: Soft, non-tender and non-distended with normal bowel sounds appreciated on auscultation.  Extremities: There is no pitting edema in the distal lower extremities bilaterally.   Skin: Warm and dry without trophic changes noted.   Musculoskeletal: exam reveals no obvious joint deformities, tenderness or joint swelling or erythema.   Neurologically:   Mental status: The patient is awake, alert and oriented in all 4 spheres. Her immediate and remote memory, attention, language skills and fund of knowledge are appropriate. There is no evidence of aphasia, agnosia, apraxia or anomia. Speech is clear with normal prosody and enunciation. Thought process is linear. Mood is normal and affect is normal.  Cranial nerves II - XII are as described above under HEENT exam.  Motor exam: Normal bulk, strength and tone is noted. There is no tremor, Romberg is negative. Fine motor skills and coordination: grossly intact.  Cerebellar testing: No dysmetria or intention tremor. There is no truncal or gait ataxia.  Sensory exam: intact to light touch in the upper and lower extremities.  Gait, station and balance: She stands easily. No veering to one side is noted. No leaning to one side is noted. Posture is age-appropriate and stance is narrow based. Gait shows normal stride length and normal pace. No problems turning are noted.   Assessment and Plan:   In summary, Jasmine Werner is a very pleasant 64 y.o.-year old female with an underlying medical history of COPD, smoking, mood disorder, substance use disorder and obesity, whose history and physical exam are concerning for obstructive sleep apnea (OSA). I had a long chat with the patient about my findings and the diagnosis of OSA, its prognosis and treatment options. We talked about medical treatments, surgical interventions and non-pharmacological approaches. I explained in particular the risks and ramifications of untreated moderate to severe OSA, especially with respect to developing cardiovascular disease down the Road, including congestive heart failure, difficult to treat hypertension, cardiac arrhythmias, or stroke. Even type 2 diabetes has, in part, been linked to untreated OSA. Symptoms of untreated OSA include daytime sleepiness, memory problems, mood irritability and mood disorder such as depression and  anxiety, lack of energy, as well as recurrent headaches, especially morning headaches. We talked about smoking cessation and trying to maintain a healthy lifestyle in general, as well as the importance of weight control. We also talked about the importance of good sleep hygiene. I recommended the following at this time: sleep study.  I explained the sleep test procedure to the patient and also outlined possible surgical and non-surgical treatment options of OSA, including the use of a custom-made dental device (which would require a referral to a specialist dentist or oral surgeon), upper airway surgical options (which would involve a referral to an ENT surgeon). I also explained the CPAP treatment option to the patient, who indicated that she would be willing to try CPAP if the need arises. I explained the importance of being compliant with PAP treatment, not only for insurance purposes but primarily to improve Her symptoms, and for the patient's long term health benefit, including to reduce Her cardiovascular risks. I answered all her questions today and the patient was in agreement. I plan to see her back after the sleep study is completed and encouraged her to call with any interim questions, concerns, problems or updates.   Thank you very much for allowing me to participate in the care of this nice patient. If I can be of any further assistance to you please do not hesitate to call me at [phone].  Sincerely,   Star Age, MD, PhD

## 2020-04-25 NOTE — Patient Instructions (Signed)

## 2020-05-15 ENCOUNTER — Ambulatory Visit (INDEPENDENT_AMBULATORY_CARE_PROVIDER_SITE_OTHER): Payer: Medicare Other | Admitting: Neurology

## 2020-05-15 DIAGNOSIS — G4710 Hypersomnia, unspecified: Secondary | ICD-10-CM | POA: Diagnosis not present

## 2020-05-15 DIAGNOSIS — G4719 Other hypersomnia: Secondary | ICD-10-CM

## 2020-05-15 DIAGNOSIS — G4734 Idiopathic sleep related nonobstructive alveolar hypoventilation: Secondary | ICD-10-CM

## 2020-05-15 DIAGNOSIS — R0683 Snoring: Secondary | ICD-10-CM

## 2020-05-15 DIAGNOSIS — R9431 Abnormal electrocardiogram [ECG] [EKG]: Secondary | ICD-10-CM

## 2020-05-15 DIAGNOSIS — F39 Unspecified mood [affective] disorder: Secondary | ICD-10-CM

## 2020-05-15 DIAGNOSIS — E669 Obesity, unspecified: Secondary | ICD-10-CM

## 2020-05-15 DIAGNOSIS — G4720 Circadian rhythm sleep disorder, unspecified type: Secondary | ICD-10-CM

## 2020-05-15 DIAGNOSIS — G4761 Periodic limb movement disorder: Secondary | ICD-10-CM | POA: Diagnosis not present

## 2020-05-15 DIAGNOSIS — Z820 Family history of epilepsy and other diseases of the nervous system: Secondary | ICD-10-CM

## 2020-05-17 NOTE — Progress Notes (Signed)
Virtual Visit via Video Note  I connected with Jasmine Werner on 05/22/20 at 11:30 AM EDT by a video enabled telemedicine application and verified that I am speaking with the correct person using two identifiers.   I discussed the limitations of evaluation and management by telemedicine and the availability of in person appointments. The patient expressed understanding and agreed to proceed.   I discussed the assessment and treatment plan with the patient. The patient was provided an opportunity to ask questions and all were answered. The patient agreed with the plan and demonstrated an understanding of the instructions.   The patient was advised to call back or seek an in-person evaluation if the symptoms worsen or if the condition fails to improve as anticipated.  Location: patient- home, provider- office   I provided 18 minutes of non-face-to-face time during this encounter.   [name], MD    [facility] MD/PA/NP OP Progress Note  05/22/2020 11:55 AM Jasmine Werner  MRN:  [id number]  Chief Complaint:  Chief Complaint    Follow-up; Depression     HPI:  This is a follow-up appointment for substance use disorder and depression.  She states that she is having "mental anguish." She does not know why she feels this way, although she has been trying not to feel bad.  She asks Ativan or Klonopin to be prescribed, stating that it is to work very well for the patient.  When she is provided psychoeducation about its potential risk of dependence and tolerance, she states that although she understands that, she cannot understand why she cannot be on this medication despite it worked very well for the patient.  She talks with her sponsor every other day.  She attends NA meeting a few times per week.  She agrees to do it every day.  She states that she has craving for any substance to ease her mental pain, although she denies any use since last July.  She has insomnia to hypersomnia.  She feels fatigue.   She has difficulty in concentration.  She has passive SI.  She feels anxious and tense.  She has occasional panic attacks.    Daily routine: reading, cleaning, going outside, sitting in the porch, talks with her sponsor every other day, attends virtual church weekly Household: lives by herself  Wt Readings from Last 3 Encounters:  04/25/20 190 lb (86.2 kg)  06/28/19 170 lb (77.1 kg)  05/26/19 190 lb (86.2 kg)    Visit Diagnosis:    ICD-10-CM   1. Cocaine use disorder, mild, in early remission ([place])  F14.11   2. MDD (major depressive disorder), recurrent episode, moderate ([place])  F33.1     Past Psychiatric History: Please see initial evaluation for full details. I have reviewed the history. No updates at this time.     Past Medical History:  Past Medical History:  Diagnosis Date  . Alcohol use   . Allergy   . Anxiety   . Asthma   . Childhood schizophrenia ([place])   . Chronic abdominal pain   . Collagen vascular disease ([place])   . COPD (chronic obstructive pulmonary disease) ([place])     Past Surgical History:  Procedure Laterality Date  . ABDOMINAL HYSTERECTOMY    . APPENDECTOMY      Family Psychiatric History: Please see initial evaluation for full details. I have reviewed the history. No updates at this time.     Family History:  Family History  Problem Relation Age of Onset  . Asthma  Other   . COPD Other   . Alcoholism Other   . Heart attack Other   . Depression Other   . Mental illness Other   . Alcohol abuse Mother   . Colon cancer Mother 31  . Alcohol abuse Father   . Depression Father     Social History:  Social History   Socioeconomic History  . Marital status: Widowed    Spouse name: Not on file  . Number of children: 5  . Years of education: Not on file  . Highest education level: Some college, no degree  Occupational History  . Not on file  Tobacco Use  . Smoking status: Current Every Day Smoker    Packs/day: 0.25    Types: Cigarettes  .  Smokeless tobacco: Never Used  Vaping Use  . Vaping Use: Never used  Substance and Sexual Activity  . Alcohol use: No  . Drug use: Yes    Types: Marijuana, Cocaine  . Sexual activity: Never  Other Topics Concern  . Not on file  Social History Narrative  . Not on file   Social Determinants of Health   Financial Resource Strain:   . Difficulty of Paying Living Expenses:   Food Insecurity:   . Worried About Charity fundraiser in the Last Year:   . Arboriculturist in the Last Year:   Transportation Needs:   . Film/video editor (Medical):   Marland Kitchen Lack of Transportation (Non-Medical):   Physical Activity:   . Days of Exercise per Week:   . Minutes of Exercise per Session:   Stress:   . Feeling of Stress :   Social Connections:   . Frequency of Communication with Friends and Family:   . Frequency of Social Gatherings with Friends and Family:   . Attends Religious Services:   . Active Member of Clubs or Organizations:   . Attends Archivist Meetings:   Marland Kitchen Marital Status:     Allergies:  Allergies  Allergen Reactions  . Tramadol Nausea And Vomiting    Metabolic Disorder Labs: Lab Results  Component Value Date   HGBA1C 6.2 (H) 06/30/2019   MPG 131.24 06/30/2019   No results found for: PROLACTIN Lab Results  Component Value Date   CHOL 191 06/30/2019   TRIG 132 06/30/2019   HDL 32 (L) 06/30/2019   CHOLHDL 6.0 06/30/2019   VLDL 26 06/30/2019   LDLCALC 133 (H) 06/30/2019   Lab Results  Component Value Date   TSH 6.057 (H) 06/30/2019    Therapeutic Level Labs: No results found for: LITHIUM No results found for: VALPROATE No components found for:  CBMZ  Current Medications: Current Outpatient Medications  Medication Sig Dispense Refill  . ASPIRIN 81 PO Take by mouth.    . budesonide-formoterol (SYMBICORT) 160-4.5 MCG/ACT inhaler Inhale 2 puffs into the lungs 2 (two) times daily.    . busPIRone (BUSPAR) 5 MG tablet Take 1 tablet (5 mg total) by  mouth 3 (three) times daily. 90 tablet 0  . ipratropium-albuterol (DUONEB) 0.5-2.5 (3) MG/3ML SOLN Inhale 3 mLs into the lungs every 6 (six) hours as needed (Shortness of Breath).   2  . lisinopril (ZESTRIL) 10 MG tablet Take 1 tablet (10 mg total) by mouth daily. 30 tablet 0  . MELATONIN PO Take by mouth.    . polyethylene glycol-electrolytes (TRILYTE) 420 g solution Take 4,000 mLs by mouth as directed. (Patient not taking: Reported on 04/25/2020) 4000 mL 0  .  rosuvastatin (CRESTOR) 5 MG tablet Take 5 mg by mouth daily.    . traZODone (DESYREL) 100 MG tablet Take 1 tablet (100 mg total) by mouth at bedtime as needed for sleep. 90 tablet 0  . varenicline (CHANTIX PAK) 0.5 MG X 11 & 1 MG X 42 tablet Take by mouth 2 (two) times daily.    Marland Kitchen venlafaxine XR (EFFEXOR-XR) 75 MG 24 hr capsule Take 3 capsules (225 mg total) by mouth daily. 270 capsule 0  . VENTOLIN HFA 108 (90 BASE) MCG/ACT inhaler Inhale 1 puff into the lungs every 6 (six) hours as needed for shortness of breath.   2   No current facility-administered medications for this visit.     Musculoskeletal: Strength & Muscle Tone: N/A Gait & Station: N/A Patient leans: N/A  Psychiatric Specialty Exam: Review of Systems  Psychiatric/Behavioral: Positive for decreased concentration, dysphoric mood, sleep disturbance and suicidal ideas. Negative for agitation, behavioral problems, confusion, hallucinations and self-injury. The patient is nervous/anxious. The patient is not hyperactive.   All other systems reviewed and are negative.   There were no vitals taken for this visit.There is no height or weight on file to calculate BMI.  General Appearance: Fairly Groomed  Eye Contact:  Good  Speech:  Clear and Coherent  Volume:  Normal  Mood:  Depressed  Affect:  Appropriate, Congruent, Restricted and Tearful  Thought Process:  Coherent  Orientation:  Full (Time, Place, and Person)  Thought Content: Logical   Suicidal Thoughts:  Yes.   without intent/plan  Homicidal Thoughts:  No  Memory:  Immediate;   Good  Judgement:  Good  Insight:  Fair  Psychomotor Activity:  Normal  Concentration:  Concentration: Good and Attention Span: Good  Recall:  Good  Fund of Knowledge: Good  Language: Good  Akathisia:  No  Handed:  Right  AIMS (if indicated): not done  Assets:  Communication Skills Desire for Improvement  ADL's:  Intact  Cognition: WNL  Sleep:  Poor   Screenings: AIMS     Admission (Discharged) from 06/29/2019 in [facility]  AIMS Total Score 0    AUDIT     Admission (Discharged) from 06/29/2019 in [facility]  Alcohol Use Disorder Identification Test Final Score (AUDIT) 0       Assessment and Plan:  Jasmine Werner is a 64 y.o. year old female with a history of depression, cocaine use disorder,COPD,type II diabetes, who presents for follow up appointment for below.   1. 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  She is advised to attend NA meeting every day.  She has a sponsor.  Will continue motivation..   2. MDD (major depressive disorder), recurrent episode, moderate ([facility]) There has been significant worsening in her depressive symptoms and anxiety without known triggers.  Will start BuSpar to target anxiety.  Will continue Effexor for depression and anxiety. Noted that she has limited option for treatment of depression given her past trials of medication/her concern of weight gain/QTc prolongation. Will consider switching from venlafaxine to lexapro at the next visit if she has limited benefit from buspar. Discussed again with the patient that benzodiazepines will not be prescribed given her history of substance use.  She will greatly benefit from CBT; will make referral.   Plan I have reviewed  and updated plans as  below 1.ContinueEffexor225mg  daily  2. Start Buspar 5 mg three times a day  3.ContinueTrazodone100mg  at night as needed for sleep 4. Next appointment- 7/20 at 9 AM, 30 mins, video 5. Referral for therapy  - last EKG in 2020: QTc 484 msec - pending result of sleep study - Emergency resources which includes 911, ED, suicide crisis line [phone]) are discussed.   Past trials of medication:sertraline (memory loss),?bupropion (she felt "horns come out of my head),lithium, prolixin, haldol, Abilify (jaw movement), oxcarbazepine, lorazepam,clonazepam  I have reviewed suicide assessment in detail. No change in the following assessment.   The patient demonstrates the following risk factors for suicide: Chronic risk factors for suicide include:psychiatric disorder ofdepression. Acute risk factorsfor suicide include: unemployment. Protective factorsfor this patient include: responsibility to others (children, family), coping skills and hope for the future. Considering these factors, the overall suicide risk at this point appears to bemoderate, but not at imminent risk to self. Patientisappropriate for outpatient follow up. She denies gun access at home.  [name], MD 05/22/2020, 11:55 AM

## 2020-05-22 ENCOUNTER — Other Ambulatory Visit: Payer: Self-pay

## 2020-05-22 ENCOUNTER — Encounter (HOSPITAL_COMMUNITY): Payer: Self-pay | Admitting: Psychiatry

## 2020-05-22 ENCOUNTER — Telehealth (INDEPENDENT_AMBULATORY_CARE_PROVIDER_SITE_OTHER): Payer: Medicare Other | Admitting: Psychiatry

## 2020-05-22 DIAGNOSIS — F331 Major depressive disorder, recurrent, moderate: Secondary | ICD-10-CM | POA: Diagnosis not present

## 2020-05-22 DIAGNOSIS — F1411 Cocaine abuse, in remission: Secondary | ICD-10-CM | POA: Diagnosis not present

## 2020-05-22 MED ORDER — BUSPIRONE HCL 5 MG PO TABS: 5.0000 mg | ORAL_TABLET | Freq: Three times a day (TID) | ORAL | 0 refills | Status: DC

## 2020-05-22 NOTE — Patient Instructions (Signed)
1.ContinueEffexor225mg  daily  2. Start buspar 5 mg three times a day  3.ContinueTrazodone100mg  at night as needed for sleep 4. Next appointment- 7/20 at 9 AM 5. Referral for therapy   CONTACT INFORMATION  What to do if you need to get in touch with someone regarding a psychiatric issue:  1. EMERGENCY: For psychiatric emergencies (if you are suicidal or if there are any other safety issues) call 911 and/or go to your nearest Emergency Room immediately.   2. IF YOU NEED SOMEONE TO TALK TO RIGHT NOW: Given my clinical responsibilities, I may not be able to speak with you over the phone for a prolonged period of time.  a. You may always call The National Suicide Prevention Lifeline at 1-800-273-TALK (936) 314-1142).  b. Your county of residence will also have local crisis services. For [facility]: [place] at [phone] ([place])

## 2020-05-24 NOTE — Progress Notes (Signed)
Patient referred by Dr. Modesta Messing, seen by me on 04/25/20, diagnostic PSG on 05/15/20.   Please call and notify the patient that the recent sleep study did not show any significant obstructive sleep apnea, but she did not sleep very well, did not sleep on her back and did not achieve dream sleep. She had lower oxygen saturations during sleep, through the night, average of 90%, nadir of 84%. Please inform patient that I recommend she see a lung specialist, if her PCP agrees. I would like for her to talk to PCP first about a referral to pulmonology; she is furthermore advised to quit smoking.  She had severe leg twitching, which may be due to her antidepressant medication, particularly effexor and leg twitching may be seen with restless legs. She had occasional extra beats, called PVCs. She is advised to talk to PCP about getting a full EKG and she may benefit from seeing a cardiologist.  If she would like, we can go over the results during a FU appt.  She does not need CPAP therapy at this point.   Thanks,  Star Age, MD, PhD [facility])

## 2020-05-24 NOTE — Procedures (Signed)
PATIENT'S NAME:  Jasmine Werner, Jasmine Werner DOB:      1956-04-07      MR#:    [id number]     DATE OF RECORDING: 05/15/2020 REFERRING M.D.:  [name], MD Study Performed:   Baseline Polysomnogram HISTORY: 64 year old woman with a history of COPD, smoking, mood disorder, substance use disorder and obesity, who reports excessive daytime somnolence. The patient endorsed the Epworth Sleepiness Scale at 15/24 points. The patient's weight 190 pounds with a height of 65 (inches), resulting in a BMI of 31.6 kg/m2. The patient's neck circumference measured 16 inches.  CURRENT MEDICATIONS: Aspirin, Symbicort, Zestril, Melatonin, Crestor, Desyrel, Chantix, Effexor-XL, Ventolin, Trilyte.   PROCEDURE:  This is a multichannel digital polysomnogram utilizing the Somnostar 11.2 system.  Electrodes and sensors were applied and monitored per AASM Specifications.   EEG, EOG, Chin and Limb EMG, were sampled at 200 Hz.  ECG, Snore and Nasal Pressure, Thermal Airflow, Respiratory Effort, CPAP Flow and Pressure, Oximetry was sampled at 50 Hz. Digital video and audio were recorded.      BASELINE STUDY  Lights Out was at 20:47 and Lights On at 04:52.  Total recording time (TRT) was 485 minutes, with a total sleep time (TST) of 256.5 minutes.   The patient's sleep latency was 41.5 minutes.  REM latency was 0 minutes.  The sleep efficiency was 52.9 %.     SLEEP ARCHITECTURE: WASO (Wake after sleep onset) was 188.5 minutes with one long period of wakefulness.  There were 7.5 minutes in Stage N1, 228.5 minutes Stage N2, 20.5 minutes Stage N3 and 0 minutes in Stage REM.  The percentage of Stage N1 was 2.9%, Stage N2 was 89.1%, which is markedly increased, Stage N3 was 8.% and Stage R (REM sleep) was absent. The arousals were noted as: 4 were spontaneous, 81 were associated with PLMs, 0 were associated with respiratory events.  RESPIRATORY ANALYSIS:  There were a total of 1 respiratory events:  0 obstructive apneas, 0 central apneas and  0 mixed apneas with a total of 0 apneas and an apnea index (AI) of 0 /hour. There were 1 hypopneas with a hypopnea index of .2 /hour. The patient also had 0 respiratory event related arousals (RERAs).      The total APNEA/HYPOPNEA INDEX (AHI) was .2/hour and the total RESPIRATORY DISTURBANCE INDEX was  .2 /hour.  0 events occurred in REM sleep and 2 events in NREM. The REM AHI was  0 /hour, versus a non-REM AHI of .2. The patient spent 0 minutes of total sleep time in the supine position and 257 minutes in non-supine.. The supine AHI was n/a versus a non-supine AHI of 0.2.  OXYGEN SATURATION & C02:  The Wake baseline 02 saturation was 90%, with the lowest being 84%. Time spent below 89% saturation equaled 165 minutes.  PERIODIC LIMB MOVEMENTS: The patient had a total of 571 Periodic Limb Movements.  The Periodic Limb Movement (PLM) index was 133.6 and the PLM Arousal index was 18.9/hour.  Audio and video analysis did not show any abnormal or unusual movements, behaviors, phonations or vocalizations. The patient took 1 bathroom break. Mild snoring was noted. The EKG was in keeping with normal sinus rhythm (NSR) with occasional PVCs noted.  Post-study, the patient indicated that sleep was the same as usual.   IMPRESSION:  1. Periodic Limb Movement Disorder (PLMD) 2. Oxygen desaturation during sleep 3. Primary Snoring 4. Dysfunctions associated with sleep stages or arousal from sleep 5. Non-specific abnormal EKG  RECOMMENDATIONS:  1. This study does not demonstrate any significant obstructive or central sleep disordered breathing with the exception of mild snoring. This study was limited due to reduced sleep efficiency and absence of REM sleep and supine sleep; sleep disordered breathing/OSA may be underestimated for that reason. CPAP therapy is not warranted based on this test.  2. The study showed lower oxygen saturations throughout the night with an average O2 saturation of 90% and nadir of  84%. Given her history of smoking and diagnosis of COPD, she will be advised to work on smoking cessation and follow up or establish care with a pulmonologist.  3. Severe PLMs (periodic limb movements of sleep) were noted during this study with moderate arousals; clinical correlation is recommended. Medication effect from the antidepressant medication should be considered.  4. The study showed occasional PVCs on single lead EKG; clinical correlation is recommended and consultation with cardiology may be feasible.  5. The patient should be cautioned not to drive, work at heights, or operate dangerous or heavy equipment when tired or sleepy. Review and reiteration of good sleep hygiene measures should be pursued with any patient. 6. The patient will be seen in follow-up in the sleep clinic at [facility] for discussion of the test results, symptom and treatment compliance review, further management strategies, etc. The referring provider will be notified of the test results.  I certify that I have reviewed the entire raw data recording prior to the issuance of this report in accordance with the Standards of Accreditation of the American Academy of Sleep Medicine (AASM)   Star Age, MD, PhD Diplomat, American Board of Neurology and Sleep Medicine (Neurology and Sleep Medicine)

## 2020-05-25 ENCOUNTER — Encounter (HOSPITAL_COMMUNITY): Payer: Self-pay

## 2020-05-25 ENCOUNTER — Other Ambulatory Visit: Payer: Self-pay

## 2020-05-25 ENCOUNTER — Emergency Department (HOSPITAL_COMMUNITY)
Admission: EM | Admit: 2020-05-25 | Discharge: 2020-05-26 | Disposition: A | Discharge status: Other Acute Inpatient Hospital | Payer: Medicare Other | Attending: Emergency Medicine | Admitting: Emergency Medicine

## 2020-05-25 DIAGNOSIS — Z887 Allergy status to serum and vaccine status: Secondary | ICD-10-CM | POA: Insufficient documentation

## 2020-05-25 DIAGNOSIS — Z7982 Long term (current) use of aspirin: Secondary | ICD-10-CM | POA: Insufficient documentation

## 2020-05-25 DIAGNOSIS — F1410 Cocaine abuse, uncomplicated: Secondary | ICD-10-CM

## 2020-05-25 DIAGNOSIS — F17210 Nicotine dependence, cigarettes, uncomplicated: Secondary | ICD-10-CM | POA: Insufficient documentation

## 2020-05-25 DIAGNOSIS — Z20822 Contact with and (suspected) exposure to covid-19: Secondary | ICD-10-CM | POA: Insufficient documentation

## 2020-05-25 DIAGNOSIS — F329 Major depressive disorder, single episode, unspecified: Secondary | ICD-10-CM | POA: Insufficient documentation

## 2020-05-25 DIAGNOSIS — R45851 Suicidal ideations: Secondary | ICD-10-CM

## 2020-05-25 LAB — ETHANOL: Alcohol, Ethyl (B): <10 mg/dL (ref <10)

## 2020-05-25 LAB — COMPREHENSIVE METABOLIC PANEL
ALT: 18 U/L (ref 0–44)
AST: 15 U/L (ref 15–41)
Albumin: 4.2 g/dL (ref 3.5–5.0)
Alkaline Phosphatase: 65 U/L (ref 38–126)
Anion gap: 10 (ref 5–15)
BUN: 14 mg/dL (ref 8–23)
CO2: 25 mmol/L (ref 22–32)
Calcium: 8.9 mg/dL (ref 8.9–10.3)
Chloride: 103 mmol/L (ref 98–111)
Creatinine, Ser: 0.68 mg/dL (ref 0.44–1.00)
GFR calc Af Amer: >60 mL/min (ref >60)
GFR calc non Af Amer: >60 mL/min (ref >60)
Glucose, Bld: 133 mg/dL — ABNORMAL HIGH (ref 70–99)
Potassium: 3.9 mmol/L (ref 3.5–5.1)
Sodium: 138 mmol/L (ref 135–145)
Total Bilirubin: 0.4 mg/dL (ref 0.3–1.2)
Total Protein: 7.7 g/dL (ref 6.5–8.1)

## 2020-05-25 LAB — URINALYSIS, ROUTINE W REFLEX MICROSCOPIC
Bacteria, UA: NONE SEEN
Bilirubin Urine: NEGATIVE
Glucose, UA: NEGATIVE mg/dL
Hgb urine dipstick: NEGATIVE
Ketones, ur: 5 mg/dL — AB
Leukocytes,Ua: NEGATIVE
Nitrite: NEGATIVE
Protein, ur: 30 mg/dL — AB
Specific Gravity, Urine: 1.023 (ref 1.005–1.030)
pH: 5 (ref 5.0–8.0)

## 2020-05-25 LAB — CBC
HCT: 48.1 % — ABNORMAL HIGH (ref 36.0–46.0)
Hemoglobin: 15.2 g/dL — ABNORMAL HIGH (ref 12.0–15.0)
MCH: 30.3 pg (ref 26.0–34.0)
MCHC: 31.6 g/dL (ref 30.0–36.0)
MCV: 95.8 fL (ref 80.0–100.0)
Platelets: 332 10*3/uL (ref 150–400)
RBC: 5.02 MIL/uL (ref 3.87–5.11)
RDW: 12.5 % (ref 11.5–15.5)
WBC: 12.3 10*3/uL — ABNORMAL HIGH (ref 4.0–10.5)
nRBC: 0 % (ref 0.0–0.2)

## 2020-05-25 LAB — RAPID URINE DRUG SCREEN, HOSP PERFORMED
Amphetamines: NOT DETECTED
Barbiturates: NOT DETECTED
Benzodiazepines: NOT DETECTED
Cocaine: POSITIVE — AB
Opiates: NOT DETECTED
Tetrahydrocannabinol: NOT DETECTED

## 2020-05-25 LAB — SARS CORONAVIRUS 2 BY RT PCR (HOSPITAL ORDER, PERFORMED IN ~~LOC~~ HOSPITAL LAB): SARS Coronavirus 2: NEGATIVE

## 2020-05-25 LAB — ACETAMINOPHEN LEVEL: Acetaminophen (Tylenol), Serum: <10 ug/mL — ABNORMAL LOW (ref 10–30)

## 2020-05-25 LAB — SALICYLATE LEVEL: Salicylate Lvl: <7 mg/dL — ABNORMAL LOW (ref 7.0–30.0)

## 2020-05-25 MED ORDER — THIAMINE HCL 100 MG/ML IJ SOLN: 100.0000 mg | Freq: Every day | INTRAMUSCULAR | Status: DC

## 2020-05-25 MED ORDER — LORAZEPAM 2 MG/ML IJ SOLN: 0.0000 mg | Freq: Four times a day (QID) | INTRAMUSCULAR | Status: DC

## 2020-05-25 MED ORDER — ALBUTEROL SULFATE HFA 108 (90 BASE) MCG/ACT IN AERS
2.0000 | INHALATION_SPRAY | Freq: Four times a day (QID) | RESPIRATORY_TRACT | Status: DC | PRN
  Administered 2020-05-25: 2 via RESPIRATORY_TRACT
  Filled 2020-05-25: qty 6.7

## 2020-05-25 MED ORDER — LORAZEPAM 1 MG PO TABS
0.0000 mg | ORAL_TABLET | Freq: Four times a day (QID) | ORAL | Status: DC
  Administered 2020-05-25: 1 mg via ORAL
  Filled 2020-05-25: qty 1

## 2020-05-25 MED ORDER — THIAMINE HCL 100 MG PO TABS
100.0000 mg | ORAL_TABLET | Freq: Every day | ORAL | Status: DC
  Administered 2020-05-25: 100 mg via ORAL
  Filled 2020-05-25: qty 1

## 2020-05-25 MED ORDER — TRAZODONE HCL 50 MG PO TABS: 100.0000 mg | ORAL_TABLET | Freq: Every day | ORAL | Status: DC

## 2020-05-25 MED ORDER — ASPIRIN 81 MG PO CHEW
81.0000 mg | CHEWABLE_TABLET | Freq: Every day | ORAL | Status: DC
  Administered 2020-05-25: 81 mg via ORAL
  Filled 2020-05-25: qty 1

## 2020-05-25 MED ORDER — LORAZEPAM 1 MG PO TABS: 0.0000 mg | ORAL_TABLET | Freq: Two times a day (BID) | ORAL | Status: DC

## 2020-05-25 MED ORDER — NICOTINE 14 MG/24HR TD PT24
14.0000 mg | MEDICATED_PATCH | Freq: Once | TRANSDERMAL | Status: DC
  Administered 2020-05-25: 14 mg via TRANSDERMAL
  Filled 2020-05-25: qty 1

## 2020-05-25 MED ORDER — TRAZODONE HCL 50 MG PO TABS
100.0000 mg | ORAL_TABLET | Freq: Every day | ORAL | Status: DC
  Administered 2020-05-25: 100 mg via ORAL
  Filled 2020-05-25: qty 2

## 2020-05-25 MED ORDER — LORAZEPAM 2 MG/ML IJ SOLN: 0.0000 mg | Freq: Two times a day (BID) | INTRAMUSCULAR | Status: DC

## 2020-05-25 MED ORDER — MELATONIN 3 MG PO TABS
9.0000 mg | ORAL_TABLET | Freq: Every day | ORAL | Status: DC
  Administered 2020-05-25: 9 mg via ORAL
  Filled 2020-05-25: qty 3

## 2020-05-25 NOTE — ED Notes (Signed)
Pt given food tray.

## 2020-05-25 NOTE — ED Notes (Signed)
Disposition: Talbot Grumbling, NP, patient meets inpatient criteria.   Rodena Piety, accepted patient to [facility] Adult Unit pending negative COVID test. RN to call [facility] Adult Unit at [phone] after negative COVID test for accepting information. Nira Conn, RN informed of status.

## 2020-05-25 NOTE — ED Notes (Signed)
Pt wanded by security after changing into psych scrubs.
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
## 2020-05-25 NOTE — ED Triage Notes (Addendum)
Pt brought to ER voluntarily by [facility] police dept because daughter says she wasn't comfortable driving with her in the car.  Reports pt has history of crack/cocaine, heroin, etoh abuse 1 year ago.  Daughter says pt relapsed approx 1 month ago.  Reports started using crack again.  Last time was " a couple of days ago."  Pt says has been hearing voices her whole life and says the crack makes the voices go away for a little while.  Daughter says pt has been hearing voices, unable to separate dreams from reality, and reporting suicidal ideation. Denies HI.  PT says was seeing bugs on the sheets on the stretcher in the ED.    Pt started on Buspar yesterday.

## 2020-05-25 NOTE — ED Provider Notes (Signed)
[facility] EMERGENCY DEPARTMENT Provider Note   CSN: [id number] Arrival date & time: 05/25/20  1341     History Chief Complaint  Patient presents with  . V70.1    Jasmine Werner is a 64 y.o. female.  64 year old female presents for behavioral health evaluation.  Patient requests I evaluate the nurses notes for her reason for being here today.  She denies abdominal pain, chest pain, rashes or other medical complaints today.  Patient states that she was self-medicating by smoking crack cocaine, denies IV drug use, reports last alcohol intake of 20 years ago.  Patient states as a child she was treated with multiple medications that just sedated her and left her "drooling."  Also treated with what sounds like ECT.  Patient is afraid of what treatments may be available today.  Pt brought to ER voluntarily by [facility] police dept because daughter says she wasn't comfortable driving with her in the car.  Reports pt has history of crack/cocaine, heroin, etoh abuse 1 year ago.  Daughter says pt relapsed approx 1 month ago.  Reports started using crack again.  Last time was " a couple of days ago."  Pt says has been hearing voices her whole life and says the crack makes the voices go away for a little while.  Daughter says pt has been hearing voices, unable to separate dreams from reality, and reporting suicidal ideation. Denies HI.  PT says was seeing bugs on the sheets on the stretcher in the ED.    Pt started on Buspar yesterday.        Past Medical History:  Diagnosis Date  . Alcohol use   . Allergy   . Anxiety   . Asthma   . Childhood schizophrenia ([place])   . Chronic abdominal pain   . Collagen vascular disease ([place])   . COPD (chronic obstructive pulmonary disease) [facility])     Patient Active Problem List   Diagnosis Date Noted  . MDD (major depressive disorder), recurrent, in full remission ([place]) 12/02/2019  . Posttraumatic stress disorder 12/02/2019  . Cocaine use disorder,  moderate, in early remission ([place]) 12/02/2019  . MDD (major depressive disorder) 06/29/2019  . Unspecified abdominal pain 05/26/2019  . Rectal bleeding 05/26/2019  . Loss of weight 05/26/2019  . MDD (major depressive disorder), recurrent episode, moderate ([place]) 07/31/2018  . Alcohol use disorder, moderate, in sustained remission ([place]) 07/31/2018  . Primary osteoarthritis of right knee 10/08/2014    Past Surgical History:  Procedure Laterality Date  . ABDOMINAL HYSTERECTOMY    . APPENDECTOMY       OB History   No obstetric history on file.     Family History  Problem Relation Age of Onset  . Asthma Other   . COPD Other   . Alcoholism Other   . Heart attack Other   . Depression Other   . Mental illness Other   . Alcohol abuse Mother   . Colon cancer Mother 75  . Alcohol abuse Father   . Depression Father     Social History   Tobacco Use  . Smoking status: Current Every Day Smoker    Packs/day: 0.25    Types: Cigarettes  . Smokeless tobacco: Never Used  Vaping Use  . Vaping Use: Never used  Substance Use Topics  . Alcohol use: Not Currently  . Drug use: Yes    Types: Marijuana, Cocaine    Home Medications Prior to Admission medications   Medication Sig Start Date End Date  Taking? Authorizing Provider  ASPIRIN 81 PO Take 1 tablet by mouth daily.    Yes [provider]  budesonide-formoterol (SYMBICORT) 160-4.5 MCG/ACT inhaler Inhale 2 puffs into the lungs 2 (two) times daily.   Yes [provider]  busPIRone (BUSPAR) 5 MG tablet Take 1 tablet (5 mg total) by mouth 3 (three) times daily. 05/22/20  Yes Hisada, Elie Goody, MD  lisinopril (ZESTRIL) 10 MG tablet Take 1 tablet (10 mg total) by mouth daily. 07/08/19  Yes Connye Burkitt, NP  MELATONIN PO Take 10 mg by mouth daily.    Yes [provider]  rosuvastatin (CRESTOR) 5 MG tablet Take 5 mg by mouth daily.   Yes [provider]  traZODone (DESYREL) 100 MG tablet Take 1 tablet (100 mg  total) by mouth at bedtime as needed for sleep. 04/12/20  Yes Hisada, Elie Goody, MD  varenicline (CHANTIX PAK) 0.5 MG X 11 & 1 MG X 42 tablet Take 1 mg by mouth 2 (two) times daily.    Yes [provider]  venlafaxine XR (EFFEXOR-XR) 75 MG 24 hr capsule Take 3 capsules (225 mg total) by mouth daily. 04/12/20  Yes Hisada, Elie Goody, MD  VENTOLIN HFA 108 (90 BASE) MCG/ACT inhaler Inhale 1 puff into the lungs every 6 (six) hours as needed for shortness of breath.  07/22/15  Yes [provider]  ipratropium-albuterol (DUONEB) 0.5-2.5 (3) MG/3ML SOLN Inhale 3 mLs into the lungs every 6 (six) hours as needed (Shortness of Breath).  Patient not taking: Reported on 05/25/2020 07/21/15   [provider]  polyethylene glycol-electrolytes (TRILYTE) 420 g solution Take 4,000 mLs by mouth as directed. Patient not taking: Reported on 04/25/2020 05/26/19   Rourk, Cristopher Estimable, MD    Allergies    Tramadol  Review of Systems   Review of Systems  Constitutional: Negative for fever.  Respiratory: Negative for shortness of breath.   Cardiovascular: Negative for chest pain.  Gastrointestinal: Negative for abdominal pain, nausea and vomiting.  Musculoskeletal: Negative for arthralgias and myalgias.  Skin: Negative for rash and wound.  Neurological: Negative for weakness.  Psychiatric/Behavioral: Positive for hallucinations and suicidal ideas. Negative for confusion. The patient is nervous/anxious.   All other systems reviewed and are negative.   Physical Exam Updated Vital Signs BP 110/77 (BP Location: Right Arm)   Pulse 93   Temp 98.4 F (36.9 C) (Oral)   Resp 18   Ht 5\' 5"  (1.651 m)   Wt 85.3 kg   SpO2 94%   BMI 31.28 kg/m   Physical Exam Vitals and nursing note reviewed.  Constitutional:      General: She is not in acute distress.    Appearance: She is well-developed. She is not diaphoretic.  HENT:     Head: Normocephalic and atraumatic.  Cardiovascular:     Rate and Rhythm:  Normal rate and regular rhythm.     Pulses: Normal pulses.     Heart sounds: Normal heart sounds.  Pulmonary:     Effort: Pulmonary effort is normal.     Breath sounds: Normal breath sounds.  Abdominal:     Palpations: Abdomen is soft.     Tenderness: There is no abdominal tenderness.  Musculoskeletal:     Right lower leg: No edema.     Left lower leg: No edema.  Skin:    General: Skin is warm and dry.     Findings: No erythema or rash.  Neurological:     Mental Status: She is alert  and oriented to person, place, and time.  Psychiatric:        Mood and Affect: Mood is anxious and depressed. Affect is tearful.        Behavior: Behavior is withdrawn.        Thought Content: Thought content is paranoid. Thought content includes suicidal ideation.     ED Results / Procedures / Treatments   Labs (all labs ordered are listed, but only abnormal results are displayed) Labs Reviewed  COMPREHENSIVE METABOLIC PANEL - Abnormal; Notable for the following components:      Result Value   Glucose, Bld 133 (*)    All other components within normal limits  CBC - Abnormal; Notable for the following components:   WBC 12.3 (*)    Hemoglobin 15.2 (*)    HCT 48.1 (*)    All other components within normal limits  RAPID URINE DRUG SCREEN, HOSP PERFORMED - Abnormal; Notable for the following components:   Cocaine POSITIVE (*)    All other components within normal limits  SALICYLATE LEVEL - Abnormal; Notable for the following components:   Salicylate Lvl <4.3 (*)    All other components within normal limits  ACETAMINOPHEN LEVEL - Abnormal; Notable for the following components:   Acetaminophen (Tylenol), Serum <10 (*)    All other components within normal limits  URINALYSIS, ROUTINE W REFLEX MICROSCOPIC - Abnormal; Notable for the following components:   APPearance HAZY (*)    Ketones, ur 5 (*)    Protein, ur 30 (*)    All other components within normal limits  SARS CORONAVIRUS 2 BY RT PCR  [facility] ORDER, [place] LAB)  ETHANOL    EKG EKG Interpretation  Date/Time:  Thursday May 25 2020 15:08:06 EDT Ventricular Rate:  92 PR Interval:  156 QRS Duration: 88 QT Interval:  392 QTC Calculation: 484 R Axis:   64 Text Interpretation: Normal sinus rhythm Normal ECG Confirmed by Noemi Chapel [phone]) on 05/25/2020 3:15:53 PM   Radiology No results found.  Procedures Procedures (including critical care time)  Medications Ordered in ED Medications  LORazepam (ATIVAN) injection 0-4 mg ( Intravenous See Alternative 05/25/20 1517)    Or  LORazepam (ATIVAN) tablet 0-4 mg (1 mg Oral Given 05/25/20 1517)  LORazepam (ATIVAN) injection 0-4 mg (has no administration in time range)    Or  LORazepam (ATIVAN) tablet 0-4 mg (has no administration in time range)  thiamine tablet 100 mg (100 mg Oral Given 05/25/20 1517)    Or  thiamine (B-1) injection 100 mg ( Intravenous See Alternative 05/25/20 1517)  nicotine (NICODERM CQ - dosed in mg/24 hours) patch 14 mg (14 mg Transdermal Patch Applied 05/25/20 1641)    ED Course  I have reviewed the triage vital signs and the nursing notes.  Pertinent labs & imaging results that were available during my care of the patient were reviewed by me and considered in my medical decision making (see chart for details).  Clinical Course as of May 25 1842  Thu May 25, 2020  [place] Patient presents voluntarily for behavioral health evaluation.  Vague report of suicidal ideation however patient will not discuss this further with me.  Does report visual hallucinations, states seeing bugs on the sheets. Plan is to complete behavioral health evaluation.  If patient tries to leave prior to evaluation and treatment planning, she may need to be IVC.   [LM]  1843 Review of labs, patient is medically cleared for behavioral health evaluation.   [  LM]    Clinical Course User Index [LM] Roque Lias   MDM Rules/Calculators/A&P                           Final Clinical Impression(s) / ED Diagnoses Final diagnoses:  None    Rx / DC Orders ED Discharge Orders    None       Roque Lias 05/25/20 1843    Daleen Bo, MD 05/26/20 [phone]

## 2020-05-25 NOTE — ED Notes (Signed)
Pt attempting to leave. Pt wants to smoke. EDP informed. Pt agrees to stay at this time. EDP made aware.

## 2020-05-25 NOTE — ED Notes (Signed)
Pt was informed that we need a urine sample. 

## 2020-05-26 ENCOUNTER — Ambulatory Visit (HOSPITAL_COMMUNITY)
Admit: 2020-05-26 | Discharge: 2020-05-26 | Disposition: A | Discharge status: Home / Self Care | Payer: Medicare Other | Attending: Psychiatry | Admitting: Psychiatry

## 2020-05-26 ENCOUNTER — Inpatient Hospital Stay (HOSPITAL_COMMUNITY)
Admission: AD | Admit: 2020-05-26 | Discharge: 2020-06-01 | DRG: 885 | Disposition: A | Discharge status: Home / Self Care | Payer: Medicare Other | Source: Intra-hospital | Attending: Psychiatry | Admitting: Psychiatry

## 2020-05-26 ENCOUNTER — Encounter (HOSPITAL_COMMUNITY): Payer: Self-pay | Admitting: Behavioral Health

## 2020-05-26 DIAGNOSIS — I700 Atherosclerosis of aorta: Secondary | ICD-10-CM | POA: Diagnosis present

## 2020-05-26 DIAGNOSIS — I10 Essential (primary) hypertension: Secondary | ICD-10-CM | POA: Diagnosis present

## 2020-05-26 DIAGNOSIS — F4310 Post-traumatic stress disorder, unspecified: Secondary | ICD-10-CM | POA: Diagnosis present

## 2020-05-26 DIAGNOSIS — Z79899 Other long term (current) drug therapy: Secondary | ICD-10-CM

## 2020-05-26 DIAGNOSIS — R000 Tachycardia, unspecified: Secondary | ICD-10-CM | POA: Diagnosis present

## 2020-05-26 DIAGNOSIS — F17210 Nicotine dependence, cigarettes, uncomplicated: Secondary | ICD-10-CM | POA: Diagnosis present

## 2020-05-26 DIAGNOSIS — G4700 Insomnia, unspecified: Secondary | ICD-10-CM | POA: Diagnosis present

## 2020-05-26 DIAGNOSIS — J449 Chronic obstructive pulmonary disease, unspecified: Secondary | ICD-10-CM | POA: Diagnosis present

## 2020-05-26 DIAGNOSIS — F1420 Cocaine dependence, uncomplicated: Secondary | ICD-10-CM | POA: Diagnosis present

## 2020-05-26 DIAGNOSIS — Z915 Personal history of self-harm: Secondary | ICD-10-CM

## 2020-05-26 DIAGNOSIS — G2581 Restless legs syndrome: Secondary | ICD-10-CM | POA: Diagnosis present

## 2020-05-26 DIAGNOSIS — E785 Hyperlipidemia, unspecified: Secondary | ICD-10-CM | POA: Diagnosis present

## 2020-05-26 DIAGNOSIS — R45851 Suicidal ideations: Secondary | ICD-10-CM | POA: Diagnosis present

## 2020-05-26 DIAGNOSIS — Z20822 Contact with and (suspected) exposure to covid-19: Secondary | ICD-10-CM | POA: Diagnosis present

## 2020-05-26 DIAGNOSIS — F333 Major depressive disorder, recurrent, severe with psychotic symptoms: Principal | ICD-10-CM | POA: Diagnosis present

## 2020-05-26 DIAGNOSIS — G4761 Periodic limb movement disorder: Secondary | ICD-10-CM | POA: Diagnosis present

## 2020-05-26 MED ORDER — ALBUTEROL SULFATE HFA 108 (90 BASE) MCG/ACT IN AERS: 1.0000 | INHALATION_SPRAY | Freq: Four times a day (QID) | RESPIRATORY_TRACT | Status: DC | PRN

## 2020-05-26 MED ORDER — OXCARBAZEPINE 150 MG PO TABS
75.0000 mg | ORAL_TABLET | Freq: Two times a day (BID) | ORAL | Status: DC
  Administered 2020-05-26 – 2020-05-29 (×7): 75 mg via ORAL
  Filled 2020-05-26 (×8): qty 0.5
  Filled 2020-05-26: qty 1
  Filled 2020-05-26 (×4): qty 0.5

## 2020-05-26 MED ORDER — ALBUTEROL SULFATE HFA 108 (90 BASE) MCG/ACT IN AERS
2.0000 | INHALATION_SPRAY | Freq: Four times a day (QID) | RESPIRATORY_TRACT | Status: DC
  Administered 2020-05-26 – 2020-06-01 (×17): 2 via RESPIRATORY_TRACT
  Filled 2020-05-26 (×2): qty 6.7

## 2020-05-26 MED ORDER — IPRATROPIUM-ALBUTEROL 0.5-2.5 (3) MG/3ML IN SOLN
3.0000 mL | Freq: Four times a day (QID) | RESPIRATORY_TRACT | Status: DC | PRN
  Administered 2020-05-26 – 2020-05-30 (×6): 3 mL via RESPIRATORY_TRACT
  Filled 2020-05-26 (×6): qty 3

## 2020-05-26 MED ORDER — LORAZEPAM 1 MG PO TABS: 1.0000 mg | ORAL_TABLET | Freq: Four times a day (QID) | ORAL | Status: DC | PRN

## 2020-05-26 MED ORDER — VENLAFAXINE HCL ER 75 MG PO CP24
225.0000 mg | ORAL_CAPSULE | Freq: Every day | ORAL | Status: DC
  Administered 2020-05-26 – 2020-05-30 (×5): 225 mg via ORAL
  Filled 2020-05-26 (×7): qty 1

## 2020-05-26 MED ORDER — TRAZODONE HCL 100 MG PO TABS
100.0000 mg | ORAL_TABLET | Freq: Every evening | ORAL | Status: DC | PRN
  Administered 2020-05-26: 100 mg via ORAL
  Filled 2020-05-26: qty 1

## 2020-05-26 MED ORDER — LISINOPRIL 10 MG PO TABS
10.0000 mg | ORAL_TABLET | Freq: Every day | ORAL | Status: DC
  Administered 2020-05-27 – 2020-06-01 (×6): 10 mg via ORAL
  Filled 2020-05-26: qty 2
  Filled 2020-05-26 (×8): qty 1

## 2020-05-26 MED ORDER — ACETAMINOPHEN 325 MG PO TABS
650.0000 mg | ORAL_TABLET | Freq: Four times a day (QID) | ORAL | Status: DC | PRN
  Administered 2020-05-26 – 2020-06-01 (×4): 650 mg via ORAL
  Filled 2020-05-26 (×5): qty 2

## 2020-05-26 MED ORDER — ROSUVASTATIN CALCIUM 5 MG PO TABS
5.0000 mg | ORAL_TABLET | Freq: Every day | ORAL | Status: DC
  Administered 2020-05-27 – 2020-06-01 (×6): 5 mg via ORAL
  Filled 2020-05-26 (×8): qty 1

## 2020-05-26 MED ORDER — POLYETHYLENE GLYCOL 3350 17 G PO PACK
17.0000 g | PACK | Freq: Every day | ORAL | Status: DC | PRN
  Administered 2020-05-27 – 2020-05-29 (×3): 17 g via ORAL
  Filled 2020-05-26 (×3): qty 1

## 2020-05-26 MED ORDER — QUETIAPINE FUMARATE 200 MG PO TABS
200.0000 mg | ORAL_TABLET | Freq: Every day | ORAL | Status: DC
  Administered 2020-05-26: 200 mg via ORAL
  Filled 2020-05-26 (×2): qty 1

## 2020-05-26 MED ORDER — ASPIRIN EC 81 MG PO TBEC
81.0000 mg | DELAYED_RELEASE_TABLET | Freq: Every day | ORAL | Status: DC
  Administered 2020-05-27 – 2020-06-01 (×6): 81 mg via ORAL
  Filled 2020-05-26 (×8): qty 1

## 2020-05-26 MED ORDER — FLUTICASONE PROPIONATE HFA 44 MCG/ACT IN AERO
2.0000 | INHALATION_SPRAY | Freq: Two times a day (BID) | RESPIRATORY_TRACT | Status: DC
  Administered 2020-05-26 – 2020-06-01 (×13): 2 via RESPIRATORY_TRACT
  Filled 2020-05-26: qty 10.6

## 2020-05-26 NOTE — BHH Counselor (Signed)
Adult Comprehensive Assessment  Patient ID: Jasmine Werner, female   DOB: 1956-06-10, 64 y.o.   MRN: [id number] Information Source: Information source: Patient  Current Stressors:  Patient states their primary concerns and needs for treatment are:: "I was having suicidal thoughts"  Patient states their goals for this hospitilization and ongoing recovery are: "I don't know yet"   Educational / Learning stressors: Denies Employment / Job issues:Retired Family Relationships: Denies any current Chartered loss adjuster / Lack of resources (include bankruptcy): Limited income, [employer] / Lack of housing: Lives alone in [place], [place]; Denies any current stressors  Physical health (include injuries & life threatening diseases): Memory issues lately, muscle cramps in her legs, gall bladder issues; COPD; Difficulty breathing  Social relationships: Denies any current stressors  Substance abuse:Endorsed using crack cocaine at least 2x a week; Patient cannot specify specific amounts  Bereavement / Loss: Reports she continues to grieve the loss of her father   Living/Environment/Situation:  Living Arrangements: Alone Living conditions (as described by patient or guardian): Single family home in [place], [place] Who else lives in the home?: Self and cat How long has patient lived in current situation?: Since January 2021 What is atmosphere in current home: Comfortable  Family History:  Marital status: Married What types of issues is patient dealing with in the relationship?: Husband is in federal prison out in [place]. Additional relationship information: Hasn't seen him since 1990 Are you sexually active?: No What is your sexual orientation?: Straight Has your sexual activity been affected by drugs, alcohol, medication, or emotional stress?: Has been married 3 times. Does patient have children?: Yes How many children?: 5 How is patient's relationship with their children?: 5 adult children:  [phone]. Reports she is working on the relationships.  Childhood History:  By whom was/is the patient raised?: Both parents Additional childhood history information: Would rather not discuss her childhood. 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 Did You Receive Any Psychiatric Treatment/Services While in the [employer]?: No Are There Guns or Other Weapons in [place]?: No  Financial Resources:   Financial resources: Medicare Does patient have a Programmer, applications or guardian?: No  Alcohol/Substance Abuse:   What has been your use of drugs/alcohol within the last 12 months?:Endorsed using crack cocaine at least 2x a week; Patient cannot specify specific amounts  Alcohol/Substance Abuse Treatment Hx: Past Tx, Outpatient, Past Tx, Inpatient If yes, describe treatment: Reports she has been hospitalized many times in the last 40 years. Reports several suicide attempts. Has alcohol/substance abuse ever caused legal problems?: No  Social Support System:   Patient's Community Support System: Fair Astronomer System: Family, friends, church friends, pastor Type of faith/religion: Darrick Meigs How does patient's faith help to cope with current illness?: Prayer, talks to her pastor  Leisure/Recreation:   Leisure and Hobbies: Doing jigsaw puzzles, reading, walking around  Strengths/Needs:   What is the patient's perception of  their strengths?: Good caregiver Patient states they can use these personal strengths during their  treatment to contribute to their recovery: Wants to become less destructive Patient states these barriers may affect their return to the community: Feels her home unsafe  Discharge Plan:   Currently receiving community mental health services: Yes (From Whom) Patient states concerns and preferences for aftercare planning are: [facility]Hassan at [facility] in [place] for medication management. Reports she is unsure of what outpatient services she would like at this time.  Patient states they will know when they are safe and ready for discharge when: Not sure, she's very anxious right now. Does patient have access to transportation?: No Does patient have financial barriers related to discharge medications?: No Patient description of barriers related to discharge medications: Has Medicare and some income Plan for no access to transportation at discharge: Car issues at the moment, but she can make it to necessary appointments  Summary/Recommendations:   Summary and Recommendations (to be completed by the evaluator): Jasmine Werner is a 64 year old female who is diagnosed with Major Depressive Disorder. She presented to the hospital seeking treatment for suicidal ideation and auditory hallucinations. During the assessment, Jasmine Werner remained and bed and presented lethargic, however she was cooperative with providing information. Jasmine Werner reports that she came into the hospital because she felt suicidal and planned to "go down the river and drown myself" as a plan. She also endorsed some auditory hallucinations which she described as "condemning". Jasmine Werner reports that she is unsure of the goals she wants to accomplish at this time. Jasmine Werner reports she wants to consider possible substance abuse treatment, in addition to outpatient psychiatric services. Jasmine Werner can benefit from crisis stabilization, medication management, therapeutic milieu  and referral services.  Jasmine Werner. 05/26/2020

## 2020-05-26 NOTE — BHH Group Notes (Signed)
05/26/2020 8:45am Type of Group and Topic: Psychoeducational Group: Discharge Planning  Participation Level: Did Not Attend  Description of Group Discharge planning group reviews patient's anticipated discharge plans and assists patients to anticipate and address any barriers to wellness/recovery in the community. Suicide prevention education is reviewed with patients in group. Therapeutic Goals 1. Patients will state their anticipated discharge plan and mental health aftercare 2. Patients will identify potential barriers to wellness in the community setting 3. Patients will engage in problem solving, solution focused discussion of ways to anticipate and address barriers to wellness/recovery   Summary of Patient Progress Plan for Discharge/Comments:  Patient did not attend this group    Radonna Ricker, MSW, [facility]  Phone: [phone] 05/26/2020 2:03 PM

## 2020-05-26 NOTE — Tx Team (Signed)
Interdisciplinary Treatment and Diagnostic Plan Update  05/26/2020 Time of Session: 9:10am Jasmine Werner MRN: [id number]  Principal Diagnosis: <principal problem not specified>  Secondary Diagnoses: Active Problems:   MDD (major depressive disorder), recurrent, severe, with psychosis ([place])   Current Medications:  Current Facility-Administered Medications  Medication Dose Route Frequency Provider Last Rate Last Admin  . acetaminophen (TYLENOL) tablet 650 mg  650 mg Oral Q6H PRN Anike, Adaku C, NP      . albuterol (VENTOLIN HFA) 108 (90 Base) MCG/ACT inhaler 2 puff  2 puff Inhalation Q6H Anike, Adaku C, NP   2 puff at 05/26/20 0828  . aspirin EC tablet 81 mg  81 mg Oral Daily Sharma Covert, MD      . fluticasone (FLOVENT HFA) 44 MCG/ACT inhaler 2 puff  2 puff Inhalation BID Sharma Covert, MD      . ipratropium-albuterol (DUONEB) 0.5-2.5 (3) MG/3ML nebulizer solution 3 mL  3 mL Nebulization Q6H PRN Sharma Covert, MD      . lisinopril (ZESTRIL) tablet 10 mg  10 mg Oral Daily Sharma Covert, MD      . polyethylene glycol (MIRALAX / GLYCOLAX) packet 17 g  17 g Oral Daily PRN Sharma Covert, MD      . rosuvastatin (CRESTOR) tablet 5 mg  5 mg Oral Daily Sharma Covert, MD      . traZODone (DESYREL) tablet 100 mg  100 mg Oral QHS PRN Sharma Covert, MD       PTA Medications: Medications Prior to Admission  Medication Sig Dispense Refill Last Dose  . ASPIRIN 81 PO Take 1 tablet by mouth daily.      . budesonide-formoterol (SYMBICORT) 160-4.5 MCG/ACT inhaler Inhale 2 puffs into the lungs 2 (two) times daily.     . busPIRone (BUSPAR) 5 MG tablet Take 1 tablet (5 mg total) by mouth 3 (three) times daily. 90 tablet 0   . ipratropium-albuterol (DUONEB) 0.5-2.5 (3) MG/3ML SOLN Inhale 3 mLs into the lungs every 6 (six) hours as needed (Shortness of Breath).  (Patient not taking: Reported on 05/25/2020)  2   . lisinopril (ZESTRIL) 10 MG tablet Take 1 tablet (10 mg total)  by mouth daily. 30 tablet 0   . MELATONIN PO Take 10 mg by mouth daily.      . polyethylene glycol-electrolytes (TRILYTE) 420 g solution Take 4,000 mLs by mouth as directed. (Patient not taking: Reported on 04/25/2020) 4000 mL 0   . rosuvastatin (CRESTOR) 5 MG tablet Take 5 mg by mouth daily.     . traZODone (DESYREL) 100 MG tablet Take 1 tablet (100 mg total) by mouth at bedtime as needed for sleep. 90 tablet 0   . varenicline (CHANTIX PAK) 0.5 MG X 11 & 1 MG X 42 tablet Take 1 mg by mouth 2 (two) times daily.      Marland Kitchen venlafaxine XR (EFFEXOR-XR) 75 MG 24 hr capsule Take 3 capsules (225 mg total) by mouth daily. 270 capsule 0   . VENTOLIN HFA 108 (90 BASE) MCG/ACT inhaler Inhale 1 puff into the lungs every 6 (six) hours as needed for shortness of breath.   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   Physician Treatment Plan for Primary Diagnosis: <principal problem not specified> Long  Term Goal(s):     Short Term Goals:    Medication Management: Evaluate patient's response, side effects, and tolerance of medication regimen.  Therapeutic Interventions: 1 to 1 sessions, Unit Group sessions and Medication administration.  Evaluation of Outcomes: Not Met  Physician Treatment Plan for Secondary Diagnosis: Active Problems:   MDD (major depressive disorder), recurrent, severe, with psychosis ([place])  Long Term Goal(s):     Short Term Goals:       Medication Management: Evaluate patient's response, side effects, and tolerance of medication regimen.  Therapeutic Interventions: 1 to 1 sessions, Unit Group sessions and Medication administration.  Evaluation of Outcomes: Not Met   RN Treatment Plan  for Primary Diagnosis: <principal problem not specified> Long Term Goal(s): Knowledge of disease and therapeutic regimen to maintain health will improve  Short Term Goals: Ability to verbalize feelings will improve, Ability to disclose and discuss suicidal ideas, Ability to identify and develop effective coping behaviors will improve and Compliance with prescribed medications will improve  Medication Management: RN will administer medications as ordered by provider, will assess and evaluate patient's response and provide education to patient for prescribed medication. RN will report any adverse and/or side effects to prescribing provider.  Therapeutic Interventions: 1 on 1 counseling sessions, Psychoeducation, Medication administration, Evaluate responses to treatment, Monitor vital signs and CBGs as ordered, Perform/monitor CIWA, COWS, AIMS and Fall Risk screenings as ordered, Perform wound care treatments as ordered.  Evaluation of Outcomes: Not Met   LCSW Treatment Plan for Primary Diagnosis: <principal problem not specified> Long Term Goal(s): Safe transition to appropriate next level of care at discharge, Engage patient in therapeutic group addressing interpersonal concerns.  Short Term Goals: Engage patient in aftercare planning with referrals and resources  Therapeutic Interventions: Assess for all discharge needs, 1 to 1 time with Social worker, Explore available resources and support systems, Assess for adequacy in community support network, Educate family and significant other(s) on suicide prevention, Complete Psychosocial Assessment, Interpersonal group therapy.  Evaluation of Outcomes: Not Met   Progress in Treatment: Attending groups: No. New to unit  Participating in groups: No. Taking medication as prescribed: Yes. Toleration medication: Yes. Family/Significant other contact made: No, will contact:  if patient consents to collateral contacts Patient understands diagnosis:  Yes. Discussing patient identified problems/goals with staff: Yes. Medical problems stabilized or resolved: Yes. Denies suicidal/homicidal ideation: No. Issues/concerns per patient self-inventory: No. Other:   New problem(s) identified: None   New Short Term/Long Term Goal(s): Detox, medication stabilization, elimination of SI thoughts, development of comprehensive mental wellness plan.    Patient Goals:  "I want to be on the right medications, I want to be sane, I want to be able to function, I want to stay sober. I was hearing voices that were very condemning. My breathing is very bad"   Discharge Plan or Barriers: Patient recently admitted. CSW will continue to follow and assess for appropriate referrals and possible discharge planning.    Reason for Continuation of Hospitalization: Anxiety Depression Other; describe Substance abuse  Estimated Length of Stay: 3-5 days   Attendees: Patient: Jasmine Werner  05/26/2020 10:27 AM  Physician: Dr. Myles Lipps, MD 05/26/2020 10:27 AM  Nursing:  05/26/2020 10:27 AM  RN Care Manager: 05/26/2020 10:27 AM  Social Worker: Radonna Ricker, LCSW 05/26/2020 10:27 AM  Recreational Therapist:  05/26/2020 10:27 AM  Other:  05/26/2020 10:27 AM  Other:  05/26/2020 10:27 AM  Other: 05/26/2020 10:27 AM    Scribe for Treatment Team: Cristy Friedlander  Dahlia Client 05/26/2020 10:27 AM

## 2020-05-26 NOTE — [facility] Suicide Risk Assessment (Signed)
[facility] Admission Suicide Risk Assessment   Nursing information obtained from:  Patient Demographic factors:  Living alone, Caucasian Current Mental Status:  Suicidal ideation indicated by patient Loss Factors:  Decline in physical health Historical Factors:  Prior suicide attempts Risk Reduction Factors:  Positive therapeutic relationship  Total Time spent with patient: 30 minutes Principal Problem: <principal problem not specified> Diagnosis:  Active Problems:   MDD (major depressive disorder), recurrent, severe, with psychosis ([place])  Subjective Data: Patient is seen and examined.  Patient is a 64 year old female with a past psychiatric history significant for depression and cocaine dependence who presented to the [facility] emergency department on 05/25/2020 with suicidal ideation as well as hallucinations.  The patient stated she was having worsening depressive symptoms.  She stated that she had had 7 previous suicide attempts.  This was while she was a teenager and in her early 1s.  She had been seeing Dr. Modesta Messing for medication management for psychiatric issues.  The patient had been seen in a televisit on 05/22/2020.  At that time the patient described having "mental anguish".  She was requesting benzodiazepines.  This was declined by the primary psychiatrist.  Her psychiatric medications on that visit included buspirone and venlafaxine extended release.  Her dosage of the venlafaxine was 225 mg p.o. daily.  The BuSpar was 5 mg p.o. 3 times daily.  Trazodone 100 mg p.o. nightly was also continued.  The patient stated that she had not been doing well after that visit, the decision was made to come to the emergency room for evaluation.  On her last psychiatric admission which took place on 06/30/2019 I actually admitted the patient.  Her discharge medications at that time included Abilify, lorazepam, Trileptal, Seroquel, venlafaxine and trazodone.  The decision was made to admit her to the hospital for  evaluation and stabilization.  During the initial evaluation her primary concern was her shortness of breath and wheezing.  The last chest x-ray that we had available to [place] was from August 2020.  It was negative for acute pneumonia at that time.  The decision was made to admit her to the hospital for evaluation and stabilization.  Continued Clinical Symptoms:  Alcohol Use Disorder Identification Test Final Score (AUDIT): 9 The "Alcohol Use Disorders Identification Test", Guidelines for Use in Primary Care, Second Edition.  World Pharmacologist [facility]). Score between 0-7:  no or low risk or alcohol related problems. Score between 8-15:  moderate risk of alcohol related problems. Score between 16-19:  high risk of alcohol related problems. Score 20 or above:  warrants further diagnostic evaluation for alcohol dependence and treatment.   CLINICAL FACTORS:   Bipolar Disorder:   Depressive phase Depression:   Anhedonia Comorbid alcohol abuse/dependence Hopelessness Impulsivity Insomnia Alcohol/Substance Abuse/Dependencies More than one psychiatric diagnosis Medical Diagnoses and Treatments/Surgeries   Musculoskeletal: Strength & Muscle Tone: decreased Gait & Station: shuffle Patient leans: N/A  Psychiatric Specialty Exam: Physical Exam  Nursing note and vitals reviewed. Constitutional: She is oriented to person, place, and time.  HENT:  Head: Normocephalic and atraumatic.  Respiratory: She has wheezes.  Neurological: She is alert and oriented to person, place, and time.    Review of Systems  Blood pressure 99/66, pulse 98, temperature 98.2 F (36.8 C), temperature source Oral, resp. rate 16, height 5\' 5"  (1.651 m), weight 85.3 kg, SpO2 94 %.Body mass index is 31.29 kg/m.  General Appearance: Disheveled  Eye Contact:  Minimal  Speech:  Normal Rate  Volume:  Decreased  Mood:  Anxious, Depressed and Dysphoric  Affect:  Congruent  Thought Process:  Goal Directed and  Descriptions of Associations: Circumstantial  Orientation:  Negative  Thought Content:  Hallucinations: Auditory  Suicidal Thoughts:  Yes.  without intent/plan  Homicidal Thoughts:  No  Memory:  Immediate;   Fair Recent;   Fair Remote;   Fair  Judgement:  Impaired  Insight:  Lacking  Psychomotor Activity:  Decreased  Concentration:  Concentration: Fair and Attention Span: Fair  Recall:  [employer] of Knowledge:  Fair  Language:  Fair  Akathisia:  Negative  Handed:  Right  AIMS (if indicated):     Assets:  Desire for Improvement Resilience  ADL's:  Impaired  Cognition:  WNL  Sleep:  Number of Hours: 3      COGNITIVE FEATURES THAT CONTRIBUTE TO RISK:  None    SUICIDE RISK:   Mild:  Suicidal ideation of limited frequency, intensity, duration, and specificity.  There are no identifiable plans, no associated intent, mild dysphoria and related symptoms, good self-control (both objective and subjective assessment), few other risk factors, and identifiable protective factors, including available and accessible social support.  PLAN OF CARE: Patient is seen and examined.  Patient is a 64 year old female with the above-stated past psychiatric history who was admitted secondary to worsening depression and suicidal ideation.  She will be admitted to the hospital.  She will be integrated in the milieu.  She will be encouraged to attend groups.  Given her high dosage of venlafaxine we will continue the 225 mg p.o. daily for now.  This may need to be weaned during the course of hospitalization but the initial treatment will include that.  Review of the electronic medical record revealed that her first office visit after the discharge in 2020 was in August 2020.  At that time her venlafaxine was continued, and the Abilify was essentially stopped.  She continued on the Seroquel.  Trileptal was continued initially, but apparently was weaned off afterwards.  We will restart the Trileptal initially,  continue the Seroquel as per above.  Given her physical issues we will get a chest x-ray today to make sure there is no underlying acute illnesses.  We will continue her out side medicines as previously written.  This includes albuterol inhalation, Flovent and DuoNeb nebulizations.  She does have benzodiazepines in her system, and I will write low-dose lorazepam 1 mg p.o. every 6 hours as needed withdrawal symptoms.  Her CIWJ will need to be greater than 10 before these are administered.  We will avoid beta-blockers given the cocaine.  Review of her admission laboratories revealed an elevated glucose at 133.  Liver function enzymes were normal.  Her white blood cell count was mildly elevated at 12.3.  Hemoglobin and hematocrit were both elevated at 15.2 and 48.1.  Her platelets were 322,000.  Acetaminophen and salicylate were both negative.  Coronavirus was negative.  Urinalysis was significant for no bacteria and no white blood cells.  Blood alcohol was less than 10.  Drug screen was positive for cocaine.  Her EKG showed a normal sinus rhythm and a QTc interval of 484.  I certify that inpatient services furnished can reasonably be expected to improve the patient's condition.   Sharma Covert, MD 05/26/2020, 12:32 PM

## 2020-05-26 NOTE — Tx Team (Signed)
Initial Treatment Plan 05/26/2020 3:28 AM Jasmine Werner TUU:[id number]    PATIENT STRESSORS: Financial difficulties Occupational concerns Substance abuse   PATIENT STRENGTHS: Capable of independent living Communication skills Supportive family/friends   PATIENT IDENTIFIED PROBLEMS: Substance abuse  Anxiety  'Physical heath'  'mental health'               DISCHARGE CRITERIA:  Improved stabilization in mood, thinking, and/or behavior Motivation to continue treatment in a less acute level of care Reduction of life-threatening or endangering symptoms to within safe limits Verbal commitment to aftercare and medication compliance  PRELIMINARY DISCHARGE PLAN: Attend aftercare/continuing care group Attend PHP/IOP Outpatient therapy Return to previous living arrangement  PATIENT/FAMILY INVOLVEMENT: This treatment plan has been presented to and reviewed with the patient, Jasmine Werner, and/or family member.  The patient and family have been given the opportunity to ask questions and make suggestions.  Wolfgang Phoenix, RN 05/26/2020, 3:28 AM

## 2020-05-26 NOTE — Progress Notes (Signed)
   05/26/20 0624  Vital Signs  Pulse Rate 98  Resp 16  BP 99/66  BP Location Left Arm  BP Method Automatic  Patient Position (if appropriate) Standing   D: Patient took all medicine available except the lisinopril was held because her blood pressure was 99/66.  Patient was out in open areas, with a productive cough Patient was given a nebulizer treatment at 1400. Patient's vitals were retaken after treatment BP102/64, P111, T 98.2, O2 sat 89%. Patient went to radiology for chest x-ray. A:   Support and encouragement provided Routine safety checks conducted every 15 minutes. Patient  Informed to notify staff with any concerns.   R: Safety maintained.

## 2020-05-26 NOTE — Progress Notes (Signed)
Jasmine Werner is a 65 y.o. female Voluntary admitted for suicide ideation with plan to jump into a river and drown. Pt has history of 7 past attempts. Pt stated she was sober for one year from [facility], cocaine, and  Heroin. Pt also complained of hearing voices and finds it difficult to separate dreams from reality. Pt has a hx of asthma and COPD. Pt has been calm and cooperative with admission. Consents signed, skin/belongings search completed and pt oriented to unit. Pt stable at this time. Pt given the opportunity to express concerns and ask questions. Pt given toiletries. Will continue to monitor.

## 2020-05-26 NOTE — H&P (Signed)
Psychiatric Admission Assessment Adult  Patient Identification: Jasmine Werner  MRN:  [id number]  Date of Evaluation:  05/26/2020  Chief Complaint:  MDD (major depressive disorder), recurrent, severe, with psychosis ([place]) [F33.3]  Principal Diagnosis: MDD (major depressive disorder), recurrent, severe, with psychosis ([place])  Diagnosis:  Principal Problem:   MDD (major depressive disorder), recurrent, severe, with psychosis ([place])  History of Present Illness: (Per Md's admission SRA notes): Patient is seen and examined.  Patient is a 64 year old female with a past psychiatric history significant for depression and cocaine dependence who presented to the [facility] emergency department on 05/25/2020 with suicidal ideation as well as hallucinations.  The patient stated she was having worsening depressive symptoms.  She stated that she had had 7 previous suicide attempts.  This was while she was a teenager and in her early 64s.  She had been seeing Dr. Modesta Messing for medication management for psychiatric issues.  The patient had been seen in a televisit on 05/22/2020.  At that time the patient described having "mental anguish".  She was requesting benzodiazepines.  This was declined by the primary psychiatrist.  Her psychiatric medications on that visit included buspirone and venlafaxine extended release.  Her dosage of the venlafaxine was 225 mg p.o. daily.  The BuSpar was 5 mg p.o. 3 times daily.  Trazodone 100 mg p.o. nightly was also continued.  The patient stated that she had not been doing well after that visit, the decision was made to come to the emergency room for evaluation.  On her last psychiatric admission which took place on 06/30/2019 I actually admitted the patient.  Her discharge medications at that time included Abilify, lorazepam, Trileptal, Seroquel, venlafaxine and trazodone.  The decision was made to admit her to the hospital for evaluation and stabilization.  During the initial evaluation her  primary concern was her shortness of breath and wheezing.  The last chest x-ray that we had available to [place] was from August 2020.  It was negative for acute pneumonia at that time.  The decision was made to admit her to the hospital for evaluation and stabilization.  Associated Signs/Symptoms: Depression Symptoms:  depressed mood, insomnia, psychomotor agitation, feelings of worthlessness/guilt, suicidal thoughts without plan, anxiety,  (Hypo) Manic Symptoms:  Hallucinations, Impulsivity, Labiality of Mood,  Anxiety Symptoms:  Excessive Worry,  Psychotic Symptoms:  Hallucinations: Auditory  PTSD Symptoms: NA  Total Time spent with patient: 1 hour  Past Psychiatric History: She has been followed by an outpatient psychiatrist ( Dr Modesta Messing) most recently.  She has been diagnosed with major depression in the past.  She also has been diagnosed with posttraumatic stress disorder.  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  Her relapse was approximately a month and a half ago.  It appears that she has been on the venlafaxine extended release, trazodone, Klonopin or Ativan for at least a year.  Is the patient at risk to self? No.  Has the patient been a risk to self in the past 6 months? Yes.    Has the patient been a risk to self within the distant past? Yes.    Is the patient a risk to others? No.  Has the patient been a risk to others in the past 6 months? No.  Has the patient been a risk to others within the distant past? No.   Prior Inpatient Therapy: Yes [facility]) Prior Outpatient Therapy: Yes, with Dr. Armandina Stammer.  Alcohol Screening: 1. How often do you have a  drink containing alcohol?: 2 to 4 times a month 2. How many drinks containing alcohol do you have on a typical day when you are drinking?: 5 or 6 3. How often do you have six or more drinks on one occasion?: Monthly AUDIT-C Score: 6 4. How often during the last year have you found that you were not able to stop  drinking once you had started?: Less than monthly 5. How often during the last year have you failed to do what was normally expected from you because of drinking?: Less than monthly 6. How often during the last year have you needed a first drink in the morning to get yourself going after a heavy drinking session?: Less than monthly 7. How often during the last year have you had a feeling of guilt of remorse after drinking?: Never 8. How often during the last year have you been unable to remember what happened the night before because you had been drinking?: Never 9. Have you or someone else been injured as a result of your drinking?: No 10. Has a relative or friend or a doctor or another health worker been concerned about your drinking or suggested you cut down?: No Alcohol Use Disorder Identification Test Final Score (AUDIT): 9 Alcohol Brief Interventions/Follow-up: Brief Advice  Substance Abuse History in the last 12 months:  Yes.    Consequences of Substance Abuse: Medical Consequences:  Liver damage, Possible death by overdose Legal Consequences:  Arrests, jail time, Loss of driving privilege. Family Consequences:  Family discord, divorce and or separation.  Previous Psychotropic Medications: Yes   Psychological Evaluations: Yes   Past Medical History:  Past Medical History:  Diagnosis Date   Alcohol use    Allergy    Anxiety    Asthma    Childhood schizophrenia ([place])    Chronic abdominal pain    Collagen vascular disease (HCC)    COPD (chronic obstructive pulmonary disease) (HCC)     Past Surgical History:  Procedure Laterality Date   ABDOMINAL HYSTERECTOMY     APPENDECTOMY     Family History:  Family History  Problem Relation Age of Onset   Asthma Other    COPD Other    Alcoholism Other    Heart attack Other    Depression Other    Mental illness Other    Alcohol abuse Mother    Colon cancer Mother 31   Alcohol abuse Father    Depression  Father    Family Psychiatric  History:   Tobacco Screening: Have you used any form of tobacco in the last 30 days? (Cigarettes, Smokeless Tobacco, Cigars, and/or Pipes): Yes Tobacco use, Select all that apply: 4 or less cigarettes per day Are you interested in Tobacco Cessation Medications?: No, patient refused Counseled patient on smoking cessation including recognizing danger situations, developing coping skills and basic information about quitting provided: Refused/Declined practical counseling Social History:  Social History   Substance and Sexual Activity  Alcohol Use Not Currently     Social History   Substance and Sexual Activity  Drug Use Yes   Types: Marijuana, Cocaine    Additional Social History: Pain Medications: See MAR Prescriptions: See MAR Over the Counter: See MAR History of alcohol / drug use?: Yes Longest period of sobriety (when/how long): one year Negative Consequences of Use: Financial, Personal relationships Withdrawal Symptoms: Weakness  Allergies:   Allergies  Allergen Reactions   Tramadol Nausea And Vomiting   Lab Results:  Results for orders placed or performed  during the hospital encounter of 05/25/20 (from the past 48 hour(s))  SARS Coronavirus 2 by RT PCR (hospital order, performed in [facility] hospital lab) Nasopharyngeal Nasopharyngeal Swab     Status: None   Collection Time: 05/25/20  2:23 PM   Specimen: Nasopharyngeal Swab  Result Value Ref Range   SARS Coronavirus 2 NEGATIVE NEGATIVE    Comment: (NOTE) SARS-CoV-2 target nucleic acids are NOT DETECTED.  The SARS-CoV-2 RNA is generally detectable in upper and lower respiratory specimens during the acute phase of infection. The lowest concentration of SARS-CoV-2 viral copies this assay can detect is 250 copies / mL. A negative result does not preclude SARS-CoV-2 infection and should not be used as the sole basis for treatment or other patient management decisions.  A negative result  may occur with improper specimen collection / handling, submission of specimen other than nasopharyngeal swab, presence of viral mutation(s) within the areas targeted by this assay, and inadequate number of viral copies (<250 copies / mL). A negative result must be combined with clinical observations, patient history, and epidemiological information.  Fact Sheet for Patients:   StrictlyIdeas.no  Fact Sheet for Healthcare Providers: BankingDealers.co.za  This test is not yet approved or  cleared by the [place] FDA and has been authorized for detection and/or diagnosis of SARS-CoV-2 by FDA under an Emergency Use Authorization (EUA).  This EUA will remain in effect (meaning this test can be used) for the duration of the COVID-19 declaration under Section 564(b)(1) of the Act, 21 U.S.C. section 360bbb-3(b)(1), unless the authorization is terminated or revoked sooner.  Performed at [facility], [address], [place], [place] [address]   Comprehensive metabolic panel     Status: Abnormal   Collection Time: 05/25/20  2:48 PM  Result Value Ref Range   Sodium 138 135 - 145 mmol/L   Potassium 3.9 3.5 - 5.1 mmol/L   Chloride 103 98 - 111 mmol/L   CO2 25 22 - 32 mmol/L   Glucose, Bld 133 (H) 70 - 99 mg/dL    Comment: Glucose reference range applies only to samples taken after fasting for at least 8 hours.   BUN 14 8 - 23 mg/dL   Creatinine, Ser 0.68 0.44 - 1.00 mg/dL   Calcium 8.9 8.9 - 10.3 mg/dL   Total Protein 7.7 6.5 - 8.1 g/dL   Albumin 4.2 3.5 - 5.0 g/dL   AST 15 15 - 41 U/L   ALT 18 0 - 44 U/L   Alkaline Phosphatase 65 38 - 126 U/L   Total Bilirubin 0.4 0.3 - 1.2 mg/dL   GFR calc non Af Amer >60 >60 mL/min   GFR calc Af Amer >60 >60 mL/min   Anion gap 10 5 - 15    Comment: Performed at [facility], [address], [place], [place] [address]  Ethanol     Status: None   Collection Time: 05/25/20  2:48 PM  Result Value Ref  Range   Alcohol, Ethyl (B) <10 <10 mg/dL    Comment: (NOTE) Lowest detectable limit for serum alcohol is 10 mg/dL.  For medical purposes only. Performed at [facility], [address] [place], [place] [address]   cbc     Status: Abnormal   Collection Time: 05/25/20  2:48 PM  Result Value Ref Range   WBC 12.3 (H) 4.0 - 10.5 K/uL   RBC 5.02 3.87 - 5.11 MIL/uL   Hemoglobin 15.2 (H) 12.0 - 15.0 g/dL   HCT 48.1 (H)  36 - 46 %   MCV 95.8 80.0 - 100.0 fL   MCH 30.3 26.0 - 34.0 pg   MCHC 31.6 30.0 - 36.0 g/dL   RDW 12.5 11.5 - 15.5 %   Platelets 332 150 - 400 K/uL   nRBC 0.0 0.0 - 0.2 %    Comment: Performed at [facility], [address], [place], [place] [address]  Salicylate level     Status: Abnormal   Collection Time: 05/25/20  2:48 PM  Result Value Ref Range   Salicylate Lvl <2.7 (L) 7.0 - 30.0 mg/dL    Comment: Performed at [facility], [address], [place], [place] [address]  Acetaminophen level     Status: Abnormal   Collection Time: 05/25/20  2:48 PM  Result Value Ref Range   Acetaminophen (Tylenol), Serum <10 (L) 10 - 30 ug/mL    Comment: (NOTE) Therapeutic concentrations vary significantly. A range of 10-30 ug/mL  may be an effective concentration for many patients. However, some  are best treated at concentrations outside of this range. Acetaminophen concentrations >150 ug/mL at 4 hours after ingestion  and >50 ug/mL at 12 hours after ingestion are often associated with  toxic reactions.  Performed at [facility], [address] [place], [place] [address]   Rapid urine drug screen (hospital performed)     Status: Abnormal   Collection Time: 05/25/20  3:35 PM  Result Value Ref Range   Opiates NONE DETECTED NONE DETECTED   Cocaine POSITIVE (A) NONE DETECTED   Benzodiazepines NONE DETECTED NONE DETECTED   Amphetamines NONE DETECTED NONE DETECTED   Tetrahydrocannabinol NONE DETECTED NONE DETECTED   Barbiturates NONE DETECTED NONE DETECTED    Comment:  (NOTE) DRUG SCREEN FOR MEDICAL PURPOSES ONLY.  IF CONFIRMATION IS NEEDED FOR ANY PURPOSE, NOTIFY LAB WITHIN 5 DAYS.  LOWEST DETECTABLE LIMITS FOR URINE DRUG SCREEN Drug Class                     Cutoff (ng/mL) Amphetamine and metabolites    1000 Barbiturate and metabolites    200 Benzodiazepine                 742 Tricyclics and metabolites     300 Opiates and metabolites        300 Cocaine and metabolites        300 THC                            50 Performed at [facility], [address], [place], [place] [address]   Urinalysis, Routine w reflex microscopic     Status: Abnormal   Collection Time: 05/25/20  3:37 PM  Result Value Ref Range   Color, Urine YELLOW YELLOW   APPearance HAZY (A) CLEAR   Specific Gravity, Urine 1.023 1.005 - 1.030   pH 5.0 5.0 - 8.0   Glucose, UA NEGATIVE NEGATIVE mg/dL   Hgb urine dipstick NEGATIVE NEGATIVE   Bilirubin Urine NEGATIVE NEGATIVE   Ketones, ur 5 (A) NEGATIVE mg/dL   Protein, ur 30 (A) NEGATIVE mg/dL   Nitrite NEGATIVE NEGATIVE   Leukocytes,Ua NEGATIVE NEGATIVE   RBC / HPF 0-5 0 - 5 RBC/hpf   WBC, UA 0-5 0 - 5 WBC/hpf   Bacteria, UA NONE SEEN NONE SEEN   Squamous Epithelial / LPF 0-5 0 - 5   Mucus PRESENT    Hyaline Casts, UA PRESENT     Comment:  Performed at [facility], [address], [place], [place] [address]   Blood Alcohol level:  Lab Results  Component Value Date   [facility] <10 05/25/2020   ETH <10 22/97/9892   Metabolic Disorder Labs:  Lab Results  Component Value Date   HGBA1C 6.2 (H) 06/30/2019   MPG 131.24 06/30/2019   No results found for: PROLACTIN Lab Results  Component Value Date   CHOL 191 06/30/2019   TRIG 132 06/30/2019   HDL 32 (L) 06/30/2019   CHOLHDL 6.0 06/30/2019   VLDL 26 06/30/2019   LDLCALC 133 (H) 06/30/2019   Current Medications: Current Facility-Administered Medications  Medication Dose Route Frequency Provider Last Rate Last Admin   acetaminophen (TYLENOL) tablet 650 mg  650 mg Oral  Q6H PRN Anike, Adaku C, NP       albuterol (VENTOLIN HFA) 108 (90 Base) MCG/ACT inhaler 2 puff  2 puff Inhalation Q6H Anike, Adaku C, NP   2 puff at 05/26/20 1320   aspirin EC tablet 81 mg  81 mg Oral Daily Sharma Covert, MD       fluticasone (FLOVENT HFA) 44 MCG/ACT inhaler 2 puff  2 puff Inhalation BID Sharma Covert, MD   2 puff at 05/26/20 1319   ipratropium-albuterol (DUONEB) 0.5-2.5 (3) MG/3ML nebulizer solution 3 mL  3 mL Nebulization Q6H PRN Sharma Covert, MD   3 mL at 05/26/20 1355   lisinopril (ZESTRIL) tablet 10 mg  10 mg Oral Daily Sharma Covert, MD       LORazepam (ATIVAN) tablet 1 mg  1 mg Oral Q6H PRN Sharma Covert, MD       OXcarbazepine (TRILEPTAL) tablet 75 mg  75 mg Oral BID Sharma Covert, MD   75 mg at 05/26/20 1320   polyethylene glycol (MIRALAX / GLYCOLAX) packet 17 g  17 g Oral Daily PRN Sharma Covert, MD       QUEtiapine (SEROQUEL) tablet 200 mg  200 mg Oral QHS Sharma Covert, MD       rosuvastatin (CRESTOR) tablet 5 mg  5 mg Oral Daily Sharma Covert, MD       traZODone (DESYREL) tablet 100 mg  100 mg Oral QHS PRN Sharma Covert, MD       venlafaxine XR (EFFEXOR-XR) 24 hr capsule 225 mg  225 mg Oral Q breakfast Sharma Covert, MD   225 mg at 05/26/20 1321   PTA Medications: Medications Prior to Admission  Medication Sig Dispense Refill Last Dose   ASPIRIN 81 PO Take 1 tablet by mouth daily.       budesonide-formoterol (SYMBICORT) 160-4.5 MCG/ACT inhaler Inhale 2 puffs into the lungs 2 (two) times daily.      busPIRone (BUSPAR) 5 MG tablet Take 1 tablet (5 mg total) by mouth 3 (three) times daily. 90 tablet 0    ipratropium-albuterol (DUONEB) 0.5-2.5 (3) MG/3ML SOLN Inhale 3 mLs into the lungs every 6 (six) hours as needed (Shortness of Breath).  (Patient not taking: Reported on 05/25/2020)  2    lisinopril (ZESTRIL) 10 MG tablet Take 1 tablet (10 mg total) by mouth daily. 30 tablet 0    MELATONIN PO  Take 10 mg by mouth daily.       polyethylene glycol-electrolytes (TRILYTE) 420 g solution Take 4,000 mLs by mouth as directed. (Patient not taking: Reported on 04/25/2020) 4000 mL 0    rosuvastatin (CRESTOR) 5 MG tablet Take 5 mg by mouth daily.  traZODone (DESYREL) 100 MG tablet Take 1 tablet (100 mg total) by mouth at bedtime as needed for sleep. 90 tablet 0    varenicline (CHANTIX PAK) 0.5 MG X 11 & 1 MG X 42 tablet Take 1 mg by mouth 2 (two) times daily.       venlafaxine XR (EFFEXOR-XR) 75 MG 24 hr capsule Take 3 capsules (225 mg total) by mouth daily. 270 capsule 0    VENTOLIN HFA 108 (90 BASE) MCG/ACT inhaler Inhale 1 puff into the lungs every 6 (six) hours as needed for shortness of breath.   2    Musculoskeletal: Strength & Muscle Tone: within normal limits Gait & Station: normal Patient leans: N/A  Psychiatric Specialty Exam: Physical Exam  Nursing note and vitals reviewed. Constitutional: She is oriented to person, place, and time. She appears well-developed.  HENT:  Head: Normocephalic and atraumatic.  Nose: Nose normal.  Mouth/Throat: Oropharynx is clear.  Eyes: Pupils are equal, round, and reactive to light.  Respiratory: Effort normal.  Genitourinary:    Genitourinary Comments: Deferred   Musculoskeletal:        General: Normal range of motion.     Cervical back: Normal range of motion.  Neurological: She is alert and oriented to person, place, and time.  Skin: Skin is warm and dry.    Review of Systems  Constitutional: Negative for chills and fever.  HENT: Negative for congestion and sore throat.   Eyes: Negative for blurred vision.  Respiratory: Negative for cough, shortness of breath and wheezing.   Cardiovascular: Negative for chest pain and palpitations.  Gastrointestinal: Negative for diarrhea, heartburn, nausea and vomiting.  Genitourinary: Negative for dysuria.  Musculoskeletal: Negative for joint pain and myalgias.  Skin: Negative.    Neurological: Negative for dizziness, tremors, seizures, loss of consciousness, weakness and headaches.  Endo/Heme/Allergies: Negative for environmental allergies. Does not bruise/bleed easily.       Allergies: Tramadol  Psychiatric/Behavioral: Positive for depression.    Blood pressure 102/64, pulse (!) 107, temperature 98.2 F (36.8 C), temperature source Oral, resp. rate 16, height 5\' 5"  (1.651 m), weight 85.3 kg, SpO2 (!) 89 %.Body mass index is 31.29 kg/m.  General Appearance: Disheveled  Eye Contact:  Minimal  Speech:  Normal Rate  Volume:  Decreased  Mood:  Anxious, Depressed and Dysphoric  Affect:  Congruent  Thought Process:  Goal Directed and Descriptions of Associations: Circumstantial  Orientation:  Negative  Thought Content:  Hallucinations: Auditory  Suicidal Thoughts:  Yes.  without intent/plan  Homicidal Thoughts:  N  Memory:  Immediate;   Fair Recent;   Fair Remote;   Fair  Judgement:  Impaired  Insight:  Lacking  Psychomotor Activity:  Decreased  Concentration:  Concentration: Fair and Attention Span: Fair  Recall:  [employer] of Knowledge:  Fair  Language:  Fair  Akathisia:  Negative  Handed:  Right  AIMS (if indicated):     Assets:  Desire for Improvement Resilience  ADL's:  Impaired  Cognition:  WNL  Sleep:  Number of Hours: 3    Sleep:  Number of Hours: 3   Treatment Plan Summary: Daily contact with patient to assess and evaluate symptoms and progress in treatment and Medication management.   Treatment Plan/Recommendations:  1. Admit for crisis management and stabilization, estimated length of stay 3-5 days.    2. Medication management to reduce current symptoms to base line and improve the patient's overall level of functioning: See MAR, Md's SRA & treatment plan.  Observation Level/Precautions:  15 minute checks  Laboratory:  Per ED  Psychotherapy: Group sessions   Medications: See [facility]  Consultations: As needed.   Discharge Concerns:  Safety, mood stability   Estimated LOS: 2-4 days  Other: Admit to the 300-hall     Physician Treatment Plan for Primary Diagnosis: MDD (major depressive disorder), recurrent, severe, with psychosis ([place])  Long Term Goal(s): Improvement in symptoms so as ready for discharge  Short Term Goals: Ability to identify changes in lifestyle to reduce recurrence of condition will improve, Ability to verbalize feelings will improve and Ability to disclose and discuss suicidal ideas  Physician Treatment Plan for Secondary Diagnosis: Principal Problem:   MDD (major depressive disorder), recurrent, severe, with psychosis ([place])  Long Term Goal(s): Improvement in symptoms so as ready for discharge  Short Term Goals: Ability to identify and develop effective coping behaviors will improve, Compliance with prescribed medications will improve and Ability to identify triggers associated with substance abuse/mental health issues will improve  I certify that inpatient services furnished can reasonably be expected to improve the patient's condition.    Lindell Spar, NP, PMHNP, FNP-BC 6/25/20213:11 PM

## 2020-05-26 NOTE — Progress Notes (Signed)
Recreation Therapy Notes  Date:  6.25.21 Time: 0930 Location: 300 Hall Dayroom  Group Topic: Stress Management  Goal Area(s) Addresses:  Patient will identify positive stress management techniques. Patient will identify benefits of using stress management post d/c.  Behavioral Response: Engaged  Intervention: Stress Management  Activity : Guided Imagery.  LRT read a script that allowed patients to envision their peaceful place.  Patients were to listen and follow along as script was read to engage in activity.  Education:  Stress Management, Discharge Planning.   Education Outcome: Acknowledges Education  Clinical Observations/Feedback: Pt attended and participated in activity.     Victorino Sparrow, LRT/CTRS         Victorino Sparrow A 05/26/2020 11:51 AM

## 2020-05-26 NOTE — B[facility] Group Notes (Signed)
Adult Occupational Therapy Group Note  Date:  05/26/2020 Time:  4:31 PM  Group Topic/Focus:  Self-Esteem  Participation Level:  Active  Participation Quality:  Appropriate and Attentive  Affect:  Anxious and Depressed  Cognitive:  Alert and Appropriate  Insight: Appropriate  Engagement in Group:  Engaged  Modes of Intervention:  Activity, Discussion, Education and Socialization  Additional Comments:    S: "I feel inadequate"  O: OT tx with focus on self esteem building this date. Education given on definition of self esteem, with both causes of low and high self esteem identified. Activity given for pt to identify a positive/aspiring trait for each letter of the alphabet. Pt to work with peers to help complete activity and build positive thinking.   A: Jasmine Werner was active for discussion portion of group, however left early for an off-unit appointment with staff. Pt engaged independently and appropriately in discussion, sharing that she feels inadequate. 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 Pt began to engage in activity portion of group before leaving for appointment. Pt presented as anxious, somewhat flat, though engageable.   P: Continue to engage patient in OT groups 2x week.   Ponciano Ort, MOT, OTR/L  05/26/2020, 4:31 PM

## 2020-05-27 MED ORDER — TRAZODONE HCL 100 MG PO TABS
100.0000 mg | ORAL_TABLET | Freq: Once | ORAL | Status: AC
  Administered 2020-05-27: 100 mg via ORAL
  Filled 2020-05-27 (×2): qty 1

## 2020-05-27 MED ORDER — QUETIAPINE FUMARATE 100 MG PO TABS
100.0000 mg | ORAL_TABLET | Freq: Every day | ORAL | Status: DC
  Filled 2020-05-27 (×3): qty 1

## 2020-05-27 MED ORDER — TRAZODONE HCL 100 MG PO TABS
100.0000 mg | ORAL_TABLET | Freq: Every day | ORAL | Status: DC
  Filled 2020-05-27 (×2): qty 1

## 2020-05-27 NOTE — Progress Notes (Signed)
D. Pt has been friendly, calm and cooperative- observed interacting well with peers and attending groups. Pt reports improving mood since this am- reported being groggy this am, unable to stay awake. Per pt's self inventory, pt wrote that her goal today was "to stay awake some and stay out of my room". Pt currently denies SI/HI and AVH  A. Labs and vitals monitored. Pt compliant with medications. Pt supported emotionally and encouraged to express concerns and ask questions.   R. Pt remains safe with 15 minute checks. Will continue POC.

## 2020-05-27 NOTE — Progress Notes (Signed)
Patient ID: Jasmine Werner, female   DOB: July 10, 1956, 64 y.o.   MRN: [id number] D: Patient calm and cooperative, observed to be sitting in the day room interacting with peers earlier in shift, and denies suicidal ideations, denies homicidal ideations, denies auditory and visual hallucinations.Patient refused her scheduled dose of Seroquel 100mg , stating that she had informed provider that medication made her very drowsy and she felt lethargic throughout the day.  Education provided that she took 200mg  last night, but tonight's dose if 100mg  which is half of what she took last night.  Patient refused to take this dose, and stated that she would only take Trazodone 100mg  because this is what works for her at home. Night shift provider [name] called and notified, and one time order for Trazodone 100mg  given.  Medication administered to patient as ordered. Patient denies other concerns and states that treatment is going well for her overall, but that she still has some disorganized thoughts. Patient has an Albuterol inhaler in her pockets, and states that she brought this from home, and that it has been in her pocket since admission.  I educated that she cannot keep any medication on her person, and asked for the medication so that I can kept with the rest of her belongings so that it can be returned to her when she leaves, and she refused to give it to this RN. AC notified.  A: All meds given as ordered. Q15 minute checks in place for safety.  R: Will continue to monitor on Q15 minute safety checks

## 2020-05-27 NOTE — Progress Notes (Signed)
Adult Psychoeducational Group Note  Date:  05/27/2020 Time:  10:21 PM  Group Topic/Focus:  Wrap-Up Group:   The focus of this group is to help patients review their daily goal of treatment and discuss progress on daily workbooks.  Participation Level:  Minimal  Participation Quality:  Attentive  Affect:  Flat  Cognitive:  Appropriate  Insight: Good  Engagement in Group:  Developing/Improving  Modes of Intervention:  Education and Support  Additional Comments:Patient attended group but did not contribute.  Zia Kanner  Patricia Pesa 05/27/2020, 10:21 PM

## 2020-05-27 NOTE — BHH Group Notes (Signed)
LCSW Group Therapy Note  05/27/2020   10:00-11:00am   Type of Therapy and Topic:  Group Therapy: Anger Cues and Responses  Participation Level:  Active   Description of Group:   In this group, patients learned how to recognize the physical, cognitive, emotional, and behavioral responses they have to anger-provoking situations.  They identified a recent time they became angry and how they reacted.  They analyzed how their reaction was possibly beneficial and how it was possibly unhelpful.  The group discussed a variety of healthier coping skills that could help with such a situation in the future.  Focus was placed on how helpful it is to recognize the underlying emotions to our anger, because working on those can lead to a more permanent solution as well as our ability to focus on the important rather than the urgent.  Therapeutic Goals: 1. Patients will remember their last incident of anger and how they felt emotionally and physically, what their thoughts were at the time, and how they behaved. 2. Patients will identify how their behavior at that time worked for them, as well as how it worked against them. 3. Patients will explore possible new behaviors to use in future anger situations. 4. Patients will learn that anger itself is normal and cannot be eliminated, and that healthier reactions can assist with resolving conflict rather than worsening situations.  Summary of Patient Progress:  The patient shared that her most recent time of anger was this morning with the Physicians Assistant.  She stated she was put on a medication that has made her too sleepy, and as a result she feels like she has "lost time."  Even on a lower dose, she would have been sleepy.  She feels an injustice was done to her, and she realizes being tired makes her all the more susceptible to anger.  When challenged to think about her thoughts and consider whether in fact the medicine was ordered deliberately to make her  tired, she continued to maintain that "people" should have known better.  She was pleasant and cooperative, supportive of others, and made statements that demonstrated her understanding of the concepts; however, she did not necessarily apply them to this particular situation. s  Therapeutic Modalities:   Cognitive Behavioral Therapy  Maretta Los

## 2020-05-27 NOTE — Progress Notes (Signed)
[facility] MD Progress Note  05/27/2020 1:37 PM Jasmine Werner  MRN:  [id number] Subjective:  "I feel overmedicated."  Jasmine Werner found resting in bed. She is complaining of feeling overmedicated, and she does appear sedated. Only 3 hours of sleep are documented overnight. She states she slept well overnight but still feels tired this morning. She denies AVH and shows no signs of responding to internal stimuli. She reports she had been doing well before relapsing on cocaine around [facility]. She denies history of AVH outside of cocaine use. She states suicidal thoughts developed after her relapse. She denies SI at this time.   From admission H&P:  Patient is a 64 year old female with a past psychiatric history significant for depression and cocaine dependence who presented to the [facility] emergency department on 05/25/2020 with suicidal ideation as well as hallucinations.  The patient stated she was having worsening depressive symptoms.  She stated that she had had 7 previous suicide attempts.  This was while she was a teenager and in her early 21s.    Principal Problem: MDD (major depressive disorder), recurrent, severe, with psychosis ([place]) Diagnosis: Principal Problem:   MDD (major depressive disorder), recurrent, severe, with psychosis ([place])  Total Time spent with patient: 15 minutes  Past Psychiatric History: See admission H&P  Past Medical History:  Past Medical History:  Diagnosis Date  . Alcohol use   . Allergy   . Anxiety   . Asthma   . Childhood schizophrenia ([place])   . Chronic abdominal pain   . Collagen vascular disease ([place])   . COPD (chronic obstructive pulmonary disease) ([place])     Past Surgical History:  Procedure Laterality Date  . ABDOMINAL HYSTERECTOMY    . APPENDECTOMY     Family History:  Family History  Problem Relation Age of Onset  . Asthma Other   . COPD Other   . Alcoholism Other   . Heart attack Other   . Depression Other   . Mental illness Other   .  Alcohol abuse Mother   . Colon cancer Mother 14  . Alcohol abuse Father   . Depression Father    Family Psychiatric  History: See admission H&P Social History:  Social History   Substance and Sexual Activity  Alcohol Use Not Currently     Social History   Substance and Sexual Activity  Drug Use Yes  . Types: Marijuana, Cocaine    Social History   Socioeconomic History  . Marital status: Widowed    Spouse name: Not on file  . Number of children: 5  . Years of education: Not on file  . Highest education level: Some college, no degree  Occupational History  . Not on file  Tobacco Use  . Smoking status: Current Every Day Smoker    Packs/day: 0.25    Types: Cigarettes  . Smokeless tobacco: Never Used  Vaping Use  . Vaping Use: Never used  Substance and Sexual Activity  . Alcohol use: Not Currently  . Drug use: Yes    Types: Marijuana, Cocaine  . Sexual activity: Never  Other Topics Concern  . Not on file  Social History Narrative  . Not on file   Social Determinants of Health   Financial Resource Strain:   . Difficulty of Paying Living Expenses:   Food Insecurity:   . Worried About Charity fundraiser in the Last Year:   . Arboriculturist in the Last Year:   Transportation Needs:   .  Lack of Transportation (Medical):   Marland Kitchen Lack of Transportation (Non-Medical):   Physical Activity:   . Days of Exercise per Week:   . Minutes of Exercise per Session:   Stress:   . Feeling of Stress :   Social Connections:   . Frequency of Communication with Friends and Family:   . Frequency of Social Gatherings with Friends and Family:   . Attends Religious Services:   . Active Member of Clubs or Organizations:   . Attends Archivist Meetings:   Marland Kitchen Marital Status:    Additional Social History:    Pain Medications: See MAR Prescriptions: See MAR Over the Counter: See MAR History of alcohol / drug use?: Yes Longest period of sobriety (when/how long): one  year Negative Consequences of Use: Financial, Personal relationships Withdrawal Symptoms: Weakness                    Sleep: Fair  Appetite:  Good  Current Medications: Current Facility-Administered Medications  Medication Dose Route Frequency Provider Last Rate Last Admin  . acetaminophen (TYLENOL) tablet 650 mg  650 mg Oral Q6H PRN Anike, Adaku C, NP   650 mg at 05/26/20 2007  . albuterol (VENTOLIN HFA) 108 (90 Base) MCG/ACT inhaler 2 puff  2 puff Inhalation Q6H Anike, Adaku C, NP   2 puff at 05/27/20 0848  . aspirin EC tablet 81 mg  81 mg Oral Daily Sharma Covert, MD   81 mg at 05/27/20 0847  . fluticasone (FLOVENT HFA) 44 MCG/ACT inhaler 2 puff  2 puff Inhalation BID Sharma Covert, MD   2 puff at 05/27/20 0848  . ipratropium-albuterol (DUONEB) 0.5-2.5 (3) MG/3ML nebulizer solution 3 mL  3 mL Nebulization Q6H PRN Sharma Covert, MD   3 mL at 05/27/20 0500  . lisinopril (ZESTRIL) tablet 10 mg  10 mg Oral Daily Sharma Covert, MD   10 mg at 05/27/20 0846  . LORazepam (ATIVAN) tablet 1 mg  1 mg Oral Q6H PRN Sharma Covert, MD      . OXcarbazepine (TRILEPTAL) tablet 75 mg  75 mg Oral BID Sharma Covert, MD   75 mg at 05/27/20 0847  . polyethylene glycol (MIRALAX / GLYCOLAX) packet 17 g  17 g Oral Daily PRN Sharma Covert, MD      . rosuvastatin (CRESTOR) tablet 5 mg  5 mg Oral Daily Sharma Covert, MD   5 mg at 05/27/20 0847  . traZODone (DESYREL) tablet 100 mg  100 mg Oral QHS Connye Burkitt, NP      . venlafaxine XR (EFFEXOR-XR) 24 hr capsule 225 mg  225 mg Oral Q breakfast Sharma Covert, MD   225 mg at 05/27/20 0347    Lab Results:  Results for orders placed or performed during the hospital encounter of 05/25/20 (from the past 48 hour(s))  SARS Coronavirus 2 by RT PCR (hospital order, performed in [facility] hospital lab) Nasopharyngeal Nasopharyngeal Swab     Status: None   Collection Time: 05/25/20  2:23 PM   Specimen: Nasopharyngeal  Swab  Result Value Ref Range   SARS Coronavirus 2 NEGATIVE NEGATIVE    Comment: (NOTE) SARS-CoV-2 target nucleic acids are NOT DETECTED.  The SARS-CoV-2 RNA is generally detectable in upper and lower respiratory specimens during the acute phase of infection. The lowest concentration of SARS-CoV-2 viral copies this assay can detect is 250 copies / mL. A negative result does not preclude SARS-CoV-2 infection  and should not be used as the sole basis for treatment or other patient management decisions.  A negative result may occur with improper specimen collection / handling, submission of specimen other than nasopharyngeal swab, presence of viral mutation(s) within the areas targeted by this assay, and inadequate number of viral copies (<250 copies / mL). A negative result must be combined with clinical observations, patient history, and epidemiological information.  Fact Sheet for Patients:   StrictlyIdeas.no  Fact Sheet for Healthcare Providers: BankingDealers.co.za  This test is not yet approved or  cleared by the [place] FDA and has been authorized for detection and/or diagnosis of SARS-CoV-2 by FDA under an Emergency Use Authorization (EUA).  This EUA will remain in effect (meaning this test can be used) for the duration of the COVID-19 declaration under Section 564(b)(1) of the Act, 21 U.S.C. section 360bbb-3(b)(1), unless the authorization is terminated or revoked sooner.  Performed at [facility], [address], [place], [place] [address]   Comprehensive metabolic panel     Status: Abnormal   Collection Time: 05/25/20  2:48 PM  Result Value Ref Range   Sodium 138 135 - 145 mmol/L   Potassium 3.9 3.5 - 5.1 mmol/L   Chloride 103 98 - 111 mmol/L   CO2 25 22 - 32 mmol/L   Glucose, Bld 133 (H) 70 - 99 mg/dL    Comment: Glucose reference range applies only to samples taken after fasting for at least 8 hours.   BUN 14 8 -  23 mg/dL   Creatinine, Ser 0.68 0.44 - 1.00 mg/dL   Calcium 8.9 8.9 - 10.3 mg/dL   Total Protein 7.7 6.5 - 8.1 g/dL   Albumin 4.2 3.5 - 5.0 g/dL   AST 15 15 - 41 U/L   ALT 18 0 - 44 U/L   Alkaline Phosphatase 65 38 - 126 U/L   Total Bilirubin 0.4 0.3 - 1.2 mg/dL   GFR calc non Af Amer >60 >60 mL/min   GFR calc Af Amer >60 >60 mL/min   Anion gap 10 5 - 15    Comment: Performed at [facility], [address], [place], [place] [address]  Ethanol     Status: None   Collection Time: 05/25/20  2:48 PM  Result Value Ref Range   Alcohol, Ethyl (B) <10 <10 mg/dL    Comment: (NOTE) Lowest detectable limit for serum alcohol is 10 mg/dL.  For medical purposes only. Performed at [facility], [address], [place], [place] [address]   cbc     Status: Abnormal   Collection Time: 05/25/20  2:48 PM  Result Value Ref Range   WBC 12.3 (H) 4.0 - 10.5 K/uL   RBC 5.02 3.87 - 5.11 MIL/uL   Hemoglobin 15.2 (H) 12.0 - 15.0 g/dL   HCT 48.1 (H) 36 - 46 %   MCV 95.8 80.0 - 100.0 fL   MCH 30.3 26.0 - 34.0 pg   MCHC 31.6 30.0 - 36.0 g/dL   RDW 12.5 11.5 - 15.5 %   Platelets 332 150 - 400 K/uL   nRBC 0.0 0.0 - 0.2 %    Comment: Performed at [facility], [address], [place], [place] [address]  Salicylate level     Status: Abnormal   Collection Time: 05/25/20  2:48 PM  Result Value Ref Range   Salicylate Lvl <2.9 (L) 7.0 - 30.0 mg/dL    Comment: Performed at [facility], [address], [place], [place] [address]  Acetaminophen  level     Status: Abnormal   Collection Time: 05/25/20  2:48 PM  Result Value Ref Range   Acetaminophen (Tylenol), Serum <10 (L) 10 - 30 ug/mL    Comment: (NOTE) Therapeutic concentrations vary significantly. A range of 10-30 ug/mL  may be an effective concentration for many patients. However, some  are best treated at concentrations outside of this range. Acetaminophen concentrations >150 ug/mL at 4 hours after ingestion  and >50 ug/mL at 12 hours after ingestion are  often associated with  toxic reactions.  Performed at [facility], [address] [place], [place] [address]   Rapid urine drug screen (hospital performed)     Status: Abnormal   Collection Time: 05/25/20  3:35 PM  Result Value Ref Range   Opiates NONE DETECTED NONE DETECTED   Cocaine POSITIVE (A) NONE DETECTED   Benzodiazepines NONE DETECTED NONE DETECTED   Amphetamines NONE DETECTED NONE DETECTED   Tetrahydrocannabinol NONE DETECTED NONE DETECTED   Barbiturates NONE DETECTED NONE DETECTED    Comment: (NOTE) DRUG SCREEN FOR MEDICAL PURPOSES ONLY.  IF CONFIRMATION IS NEEDED FOR ANY PURPOSE, NOTIFY LAB WITHIN 5 DAYS.  LOWEST DETECTABLE LIMITS FOR URINE DRUG SCREEN Drug Class                     Cutoff (ng/mL) Amphetamine and metabolites    1000 Barbiturate and metabolites    200 Benzodiazepine                 062 Tricyclics and metabolites     300 Opiates and metabolites        300 Cocaine and metabolites        300 THC                            50 Performed at [address] [place], [place] [address]   Urinalysis, Routine w reflex microscopic     Status: Abnormal   Collection Time: 05/25/20  3:37 PM  Result Value Ref Range   Color, Urine YELLOW YELLOW   APPearance HAZY (A) CLEAR   Specific Gravity, Urine 1.023 1.005 - 1.030   pH 5.0 5.0 - 8.0   Glucose, UA NEGATIVE NEGATIVE mg/dL   Hgb urine dipstick NEGATIVE NEGATIVE   Bilirubin Urine NEGATIVE NEGATIVE   Ketones, ur 5 (A) NEGATIVE mg/dL   Protein, ur 30 (A) NEGATIVE mg/dL   Nitrite NEGATIVE NEGATIVE   Leukocytes,Ua NEGATIVE NEGATIVE   RBC / HPF 0-5 0 - 5 RBC/hpf   WBC, UA 0-5 0 - 5 WBC/hpf   Bacteria, UA NONE SEEN NONE SEEN   Squamous Epithelial / LPF 0-5 0 - 5   Mucus PRESENT    Hyaline Casts, UA PRESENT     Comment: Performed at [facility], [address] [place],  [address]    Blood Alcohol level:  Lab Results  Component Value Date   [facility] <10 05/25/2020   ETH <10 61/60/7371     Metabolic Disorder Labs: Lab Results  Component Value Date   HGBA1C 6.2 (H) 06/30/2019   MPG 131.24 06/30/2019   No results found for: PROLACTIN Lab Results  Component Value Date   CHOL 191 06/30/2019   TRIG 132 06/30/2019   HDL 32 (L) 06/30/2019   CHOLHDL 6.0 06/30/2019   VLDL 26 06/30/2019   LDLCALC 133 (H) 06/30/2019    Physical Findings: AIMS: Facial and Oral Movements Muscles of Facial  Expression: None, normal Lips and Perioral Area: None, normal Jaw: None, normal Tongue: None, normal,Extremity Movements Upper (arms, wrists, hands, fingers): None, normal Lower (legs, knees, ankles, toes): None, normal, Trunk Movements Neck, shoulders, hips: None, normal, Overall Severity Severity of abnormal movements (highest score from questions above): None, normal Incapacitation due to abnormal movements: None, normal Patient's awareness of abnormal movements (rate only patient's report): No Awareness, Dental Status Current problems with teeth and/or dentures?: No Does patient usually wear dentures?: No  CIWA:  CIWA-Ar Total: 3 COWS:  COWS Total Score: 4  Musculoskeletal: Strength & Muscle Tone: within normal limits Gait & Station: normal Patient leans: N/A  Psychiatric Specialty Exam: Physical Exam Vitals and nursing note reviewed.  Constitutional:      Appearance: She is well-developed.  Cardiovascular:     Rate and Rhythm: Normal rate.  Pulmonary:     Effort: Pulmonary effort is normal.  Neurological:     Mental Status: She is alert and oriented to person, place, and time.     Review of Systems  Constitutional: Negative.   Psychiatric/Behavioral: Negative for agitation, behavioral problems, confusion, decreased concentration, dysphoric mood, hallucinations, self-injury, sleep disturbance and suicidal ideas. The patient is nervous/anxious. The patient is not hyperactive.     Blood pressure 116/76, pulse 100, temperature 98.8 F (37.1 C), temperature source  Oral, resp. rate 16, height 5\' 5"  (1.651 m), weight 85.3 kg, SpO2 90 %.Body mass index is 31.29 kg/m.  General Appearance: Fairly Groomed  Eye Contact:  Fair  Speech:  Normal Rate  Volume:  Normal  Mood:  Anxious  Affect:  Congruent  Thought Process:  Coherent  Orientation:  Full (Time, Place, and Person)  Thought Content:  Logical  Suicidal Thoughts:  No  Homicidal Thoughts:  No  Memory:  Immediate;   Fair Recent;   Fair  Judgement:  Intact  Insight:  Fair  Psychomotor Activity:  Decreased  Concentration:  Concentration: Fair and Attention Span: Fair  Recall:  [employer] of Knowledge:  Fair  Language:  Good  Akathisia:  No  Handed:  Right  AIMS (if indicated):     Assets:  Communication Skills Desire for Improvement Financial Resources/Insurance Housing  ADL's:  Intact  Cognition:  WNL  Sleep:  Number of Hours: 3     Treatment Plan Summary: Daily contact with patient to assess and evaluate symptoms and progress in treatment and Medication management   Continue inpatient hospitalization.  Decrease Seroquel to 100 mg PO QHS for mood/sleep Continue Trileptal 75 mg PO BID for mood instability Continue Effexor XR 225 mg PO daily for depression/anxiety Continue ASA 81 mg PO daily for antiplatelet Continue fluticasone 44 mcg BID for COPD Continue Duoneb neb treatment Q6HR PRN SOB Continue lisinopril 10 mg PO daily for HTN Continue Crestor 5 mg PO daily for HLD Continue Ativan 1 mg PO Q6HR PRN CIWA>10 for withdrawal  Patient will participate in the therapeutic group milieu.  Discharge disposition in progress.   Connye Burkitt, NP 05/27/2020, 1:37 PM

## 2020-05-27 NOTE — Progress Notes (Signed)
   05/27/20 0145  Psych Admission Type (Psych Patients Only)  Admission Status Voluntary  Psychosocial Assessment  Patient Complaints Anxiety  Eye Contact Avoids  Facial Expression Anxious  Affect Blunted  Speech Logical/coherent  Interaction Arrogant  Motor Activity Slow  Appearance/Hygiene In scrubs  Behavior Characteristics Cooperative  Mood Depressed  Aggressive Behavior  Targets  (isolates)  Thought Process  Coherency Unable to assess  Content Preoccupation  Delusions None reported or observed  Perception WDL  Hallucination Auditory;Visual  Judgment Poor  Confusion Mild  Danger to Self  Current suicidal ideation? Denies  Self-Injurious Behavior No self-injurious ideation or behavior indicators observed or expressed   Agreement Not to Harm Self Yes  Description of Agreement verbal  Danger to Others  Danger to Others None reported or observed  D: Patient in dayroom reports increase anxiety. Pt able to attend evening AA group. A: Medications administered as prescribed. Support and encouragement provided as needed.  R: Patient remains safe on the unit. Will continue to monitor for safety and stability.

## 2020-05-27 NOTE — BHH Group Notes (Signed)
Adult Psychoeducational Group Note  Date:  05/27/2020 Time:  11:45 AM  Group Topic/Focus:  Goals Group:   The focus of this group is to help patients establish daily goals to achieve during treatment and discuss how the patient can incorporate goal setting into their daily lives to aide in recovery.  Participation Level:  Active  Participation Quality:  Appropriate  Affect:  Blunted and Flat  Cognitive:  Appropriate  Insight: Lacking  Engagement in Group:  Engaged  Modes of Intervention:  Discussion, Education and Exploration  Additional Comments:  Pt stated her goal for today was to stay up and attend the groups   Bryson Dames A 05/27/2020, 11:45 AM

## 2020-05-28 MED ORDER — TRAZODONE HCL 100 MG PO TABS
100.0000 mg | ORAL_TABLET | Freq: Every day | ORAL | Status: DC
  Administered 2020-05-28: 100 mg via ORAL
  Filled 2020-05-28 (×2): qty 1

## 2020-05-28 NOTE — Progress Notes (Signed)
Adult Psychoeducational Group Note  Date:  05/28/2020 Time:  11:41 PM  Group Topic/Focus:  Wrap-Up Group:   The focus of this group is to help patients review their daily goal of treatment and discuss progress on daily workbooks.  Participation Level:  Active  Participation Quality:  Attentive  Affect:  Appropriate  Cognitive:  Alert  Insight: Good  Engagement in Group:  Engaged  Modes of Intervention:  Education and Support  Additional Comments:  Malinda participated in the group discussion  Higinio Plan 05/28/2020, 11:41 PM

## 2020-05-28 NOTE — Progress Notes (Signed)
[facility] MD Progress Note  05/28/2020 12:25 PM Jasmine Werner  MRN:  [id number] Subjective:  "I'm not taking the Seroquel."  Jasmine Werner found sitting in the dayroom. She refused the Seroquel last night, and nighttime provider ordered one-time dose of trazodone. Patient is informed of increased risk of respiratory depression with the trazodone and history of COPD. Patient states understanding and states she still wishes to take trazodone due to doing well with trazodone for several years. She requests Ativan or Klonopin instead but advised that controlled substances would not be prescribed due to risk of dependency, as well as respiratory depression. She reports mood is improved from admission. She is frustrated with repeated relapses on cocaine and is asking about more intensive outpatient options for follow-up. She denies SI.   From admission H&P: Patient is a 64 year old female with a past psychiatric history significant for depression and cocaine dependence who presented to the [facility] emergency department on 05/25/2020 with suicidal ideation as well as hallucinations. The patient stated she was having worsening depressive symptoms. She stated that she had had 7 previous suicide attempts. This was while she was a teenager and in her early 88s.   Principal Problem: MDD (major depressive disorder), recurrent, severe, with psychosis ([place]) Diagnosis: Principal Problem:   MDD (major depressive disorder), recurrent, severe, with psychosis ([place])  Total Time spent with patient: 15 minutes  Past Psychiatric History: See admission H&P  Past Medical History:  Past Medical History:  Diagnosis Date  . Alcohol use   . Allergy   . Anxiety   . Asthma   . Childhood schizophrenia ([place])   . Chronic abdominal pain   . Collagen vascular disease ([place])   . COPD (chronic obstructive pulmonary disease) ([place])     Past Surgical History:  Procedure Laterality Date  . ABDOMINAL HYSTERECTOMY    .  APPENDECTOMY     Family History:  Family History  Problem Relation Age of Onset  . Asthma Other   . COPD Other   . Alcoholism Other   . Heart attack Other   . Depression Other   . Mental illness Other   . Alcohol abuse Mother   . Colon cancer Mother 1  . Alcohol abuse Father   . Depression Father    Family Psychiatric  History: See admission H&P Social History:  Social History   Substance and Sexual Activity  Alcohol Use Not Currently     Social History   Substance and Sexual Activity  Drug Use Yes  . Types: Marijuana, Cocaine    Social History   Socioeconomic History  . Marital status: Widowed    Spouse name: Not on file  . Number of children: 5  . Years of education: Not on file  . Highest education level: Some college, no degree  Occupational History  . Not on file  Tobacco Use  . Smoking status: Current Every Day Smoker    Packs/day: 0.25    Types: Cigarettes  . Smokeless tobacco: Never Used  Vaping Use  . Vaping Use: Never used  Substance and Sexual Activity  . Alcohol use: Not Currently  . Drug use: Yes    Types: Marijuana, Cocaine  . Sexual activity: Never  Other Topics Concern  . Not on file  Social History Narrative  . Not on file   Social Determinants of Health   Financial Resource Strain:   . Difficulty of Paying Living Expenses:   Food Insecurity:   . Worried About [employer] of  Food in the Last Year:   . [place] in the Last Year:   Transportation Needs:   . Film/video editor (Medical):   Marland Kitchen Lack of Transportation (Non-Medical):   Physical Activity:   . Days of Exercise per Week:   . Minutes of Exercise per Session:   Stress:   . Feeling of Stress :   Social Connections:   . Frequency of Communication with Friends and Family:   . Frequency of Social Gatherings with Friends and Family:   . Attends Religious Services:   . Active Member of Clubs or Organizations:   . Attends Archivist Meetings:   Marland Kitchen Marital  Status:    Additional Social History:    Pain Medications: See MAR Prescriptions: See MAR Over the Counter: See MAR History of alcohol / drug use?: Yes Longest period of sobriety (when/how long): one year Negative Consequences of Use: Financial, Personal relationships Withdrawal Symptoms: Weakness                    Sleep: Good  Appetite:  Good  Current Medications: Current Facility-Administered Medications  Medication Dose Route Frequency Provider Last Rate Last Admin  . acetaminophen (TYLENOL) tablet 650 mg  650 mg Oral Q6H PRN Anike, Adaku C, NP   650 mg at 05/26/20 2007  . albuterol (VENTOLIN HFA) 108 (90 Base) MCG/ACT inhaler 2 puff  2 puff Inhalation Q6H Anike, Adaku C, NP   2 puff at 05/28/20 0800  . aspirin EC tablet 81 mg  81 mg Oral Daily Sharma Covert, MD   81 mg at 05/28/20 0801  . fluticasone (FLOVENT HFA) 44 MCG/ACT inhaler 2 puff  2 puff Inhalation BID Sharma Covert, MD   2 puff at 05/28/20 0800  . ipratropium-albuterol (DUONEB) 0.5-2.5 (3) MG/3ML nebulizer solution 3 mL  3 mL Nebulization Q6H PRN Sharma Covert, MD   3 mL at 05/27/20 1900  . lisinopril (ZESTRIL) tablet 10 mg  10 mg Oral Daily Sharma Covert, MD   10 mg at 05/28/20 0802  . LORazepam (ATIVAN) tablet 1 mg  1 mg Oral Q6H PRN Sharma Covert, MD      . OXcarbazepine (TRILEPTAL) tablet 75 mg  75 mg Oral BID Sharma Covert, MD   75 mg at 05/28/20 0801  . polyethylene glycol (MIRALAX / GLYCOLAX) packet 17 g  17 g Oral Daily PRN Sharma Covert, MD   17 g at 05/27/20 1912  . QUEtiapine (SEROQUEL) tablet 100 mg  100 mg Oral QHS Connye Burkitt, NP      . rosuvastatin (CRESTOR) tablet 5 mg  5 mg Oral Daily Sharma Covert, MD   5 mg at 05/28/20 0802  . venlafaxine XR (EFFEXOR-XR) 24 hr capsule 225 mg  225 mg Oral Q breakfast Sharma Covert, MD   225 mg at 05/28/20 0801    Lab Results: No results found for this or any previous visit (from the past 48 hour(s)).  Blood  Alcohol level:  Lab Results  Component Value Date   ETH <10 05/25/2020   ETH <10 26/20/3559    Metabolic Disorder Labs: Lab Results  Component Value Date   HGBA1C 6.2 (H) 06/30/2019   MPG 131.24 06/30/2019   No results found for: PROLACTIN Lab Results  Component Value Date   CHOL 191 06/30/2019   TRIG 132 06/30/2019   HDL 32 (L) 06/30/2019   CHOLHDL 6.0 06/30/2019   VLDL 26  06/30/2019   LDLCALC 133 (H) 06/30/2019    Physical Findings: AIMS: Facial and Oral Movements Muscles of Facial Expression: None, normal Lips and Perioral Area: None, normal Jaw: None, normal Tongue: None, normal,Extremity Movements Upper (arms, wrists, hands, fingers): None, normal Lower (legs, knees, ankles, toes): None, normal, Trunk Movements Neck, shoulders, hips: None, normal, Overall Severity Severity of abnormal movements (highest score from questions above): None, normal Incapacitation due to abnormal movements: None, normal Patient's awareness of abnormal movements (rate only patient's report): No Awareness, Dental Status Current problems with teeth and/or dentures?: No Does patient usually wear dentures?: No  CIWA:  CIWA-Ar Total: 3 COWS:  COWS Total Score: 4  Musculoskeletal: Strength & Muscle Tone: within normal limits Gait & Station: normal Patient leans: N/A  Psychiatric Specialty Exam: Physical Exam Vitals and nursing note reviewed.  Constitutional:      Appearance: She is well-developed.  Cardiovascular:     Rate and Rhythm: Normal rate.  Pulmonary:     Effort: Pulmonary effort is normal.  Neurological:     Mental Status: She is alert and oriented to person, place, and time.     Review of Systems  Constitutional: Negative.   Respiratory: Negative for cough and shortness of breath.   Psychiatric/Behavioral: Negative for agitation, behavioral problems, confusion, decreased concentration, dysphoric mood, hallucinations, self-injury, sleep disturbance and suicidal ideas.  The patient is not nervous/anxious and is not hyperactive.     Blood pressure 124/88, pulse 100, temperature 98.8 F (37.1 C), temperature source Oral, resp. rate 16, height 5\' 5"  (1.651 m), weight 85.3 kg, SpO2 90 %.Body mass index is 31.29 kg/m.  General Appearance: Casual  Eye Contact:  Good  Speech:  Normal Rate  Volume:  Normal  Mood:  Euthymic  Affect:  Appropriate and Congruent  Thought Process:  Coherent and Goal Directed  Orientation:  Full (Time, Place, and Person)  Thought Content:  Logical  Suicidal Thoughts:  No  Homicidal Thoughts:  No  Memory:  Immediate;   Fair Recent;   Fair  Judgement:  Intact  Insight:  Fair  Psychomotor Activity:  Normal  Concentration:  Concentration: Fair and Attention Span: Fair  Recall:  [employer] of Knowledge:  Fair  Language:  Good  Akathisia:  No  Handed:  Right  AIMS (if indicated):     Assets:  Communication Skills Desire for Improvement Financial Resources/Insurance Housing  ADL's:  Intact  Cognition:  WNL  Sleep:  Number of Hours: 3     Treatment Plan Summary: Daily contact with patient to assess and evaluate symptoms and progress in treatment and Medication management   Continue inpatient hospitalization.  Restart trazodone 100 mg PO QHS for mood/sleep Discontinue Seroquel Continue Trileptal 75 mg PO BID for mood instability Continue Effexor XR 225 mg PO daily for depression/anxiety Continue ASA 81 mg PO daily for antiplatelet Continue fluticasone 44 mcg BID for COPD Continue Duoneb neb treatment Q6HR PRN SOB Continue lisinopril 10 mg PO daily for HTN Continue Crestor 5 mg PO daily for HLD Continue Ativan 1 mg PO Q6HR PRN CIWA>10 for withdrawal  Patient will participate in the therapeutic group milieu.  Discharge disposition in progress. Will consult with CSW regarding more intensive outpatient options for substance use.  Connye Burkitt, NP 05/28/2020, 12:25 PM

## 2020-05-28 NOTE — Progress Notes (Signed)
   05/28/20 1000  Psych Admission Type (Psych Patients Only)  Admission Status Voluntary  Psychosocial Assessment  Patient Complaints None  Eye Contact Brief  Facial Expression Anxious  Affect Blunted  Speech Logical/coherent  Interaction Arrogant  Motor Activity Slow  Appearance/Hygiene In scrubs  Behavior Characteristics Cooperative  Mood Pleasant;Sad  Aggressive Behavior  Targets  (isolates)  Type of Behavior Verbal  Effect No apparent injury  Thought Process  Coherency WDL  Content Preoccupation  Delusions None reported or observed  Perception WDL  Hallucination None reported or observed  Judgment Poor  Confusion Mild  Danger to Self  Current suicidal ideation? Denies  Self-Injurious Behavior No self-injurious ideation or behavior indicators observed or expressed   Agreement Not to Harm Self Yes  Description of Agreement verbal  Danger to Others  Danger to Others None reported or observed

## 2020-05-28 NOTE — [facility] Group Notes (Signed)
[place] LCSW Group Therapy Note  Date/Time:  05/28/2020 9:00-10:00 or 10:00-11:00AM  Type of Therapy and Topic:  Group Therapy:  Healthy and Unhealthy Supports  Participation Level:  Active   Description of Group:  Patients in this group were introduced to the idea of adding a variety of healthy supports to address the various needs in their lives.Patients discussed what additional healthy supports could be helpful in their recovery and wellness after discharge in order to prevent future hospitalizations.   An emphasis was placed on using counselor, doctor, therapy groups, 12-step groups, and problem-specific support groups to expand supports.  Several songs were played to emphasize points made throughout group.  Therapeutic Goals:   1)  discuss importance of adding supports to stay well once out of the hospital  2)  compare healthy versus unhealthy supports and identify some examples of each  3)  generate ideas and descriptions of healthy supports that can be added  4)  offer mutual support about how to address unhealthy supports  5)  encourage active participation in and adherence to discharge plan    Summary of Patient Progress:  The patient expressed a willingness to add an ACTT Team as support(s) to help in her recovery journey.  She also asked about SAIOP and about the wisdom of having a roommate, so we discussed boundary issues.   Therapeutic Modalities:   Motivational Interviewing Brief Solution-Focused Therapy  Selmer Dominion, LCSW

## 2020-05-29 MED ORDER — GABAPENTIN 300 MG PO CAPS
300.0000 mg | ORAL_CAPSULE | Freq: Every day | ORAL | Status: DC
  Administered 2020-05-29 – 2020-05-30 (×2): 300 mg via ORAL
  Filled 2020-05-29 (×5): qty 1

## 2020-05-29 MED ORDER — MELATONIN 5 MG PO TABS
10.0000 mg | ORAL_TABLET | Freq: Every day | ORAL | Status: DC
  Administered 2020-05-29 – 2020-05-31 (×3): 10 mg via ORAL
  Filled 2020-05-29 (×5): qty 2

## 2020-05-29 NOTE — Progress Notes (Signed)
   05/29/20 0400  Psych Admission Type (Psych Patients Only)  Admission Status Voluntary  Psychosocial Assessment  Patient Complaints Anxiety  Eye Contact Brief  Facial Expression Anxious  Affect Blunted  Speech Logical/coherent  Interaction Arrogant  Motor Activity Slow  Appearance/Hygiene In scrubs  Behavior Characteristics Cooperative  Mood Pleasant  Aggressive Behavior  Targets  (isolates)  Type of Behavior Verbal  Effect No apparent injury  Thought Process  Coherency WDL  Content Preoccupation  Delusions None reported or observed  Perception WDL  Hallucination None reported or observed  Judgment Poor  Confusion Mild  Danger to Self  Current suicidal ideation? Denies  Self-Injurious Behavior No self-injurious ideation or behavior indicators observed or expressed   Agreement Not to Harm Self Yes  Description of Agreement verbal  Danger to Others  Danger to Others None reported or observed

## 2020-05-29 NOTE — Progress Notes (Signed)
Recreation Therapy Notes  Date: 6.28.21 Time: 0930 Location: [address] Group Room  Group Topic: Stress Management  Goal Area(s) Addresses:  Patient will identify positive stress management techniques. Patient will identify benefits of using stress management post d/c.  Behavioral Response: Engaged  Intervention: Stress Management  Activity : Guided Imagery.  LRT read a script that took patients on a journey to the beach to enjoy the peaceful waves.  Patients were to listen and follow along as script was read.  Education:  Stress Management, Discharge Planning.   Education Outcome: Acknowledges Education  Clinical Observations/Feedback: Pt attended and participated in group activity.    Victorino Sparrow, LRT/CTRS         Victorino Sparrow A 05/29/2020 11:29 AM

## 2020-05-29 NOTE — [facility] Group Notes (Signed)
LCSW Group Therapy Note 05/29/2020 3:10 PM  Type of Therapy and Topic: Group Therapy: Overcoming Obstacles  Participation Level: Active  Description of Group:  In this group patients will be encouraged to explore what they see as obstacles to their own wellness and recovery. They will be guided to discuss their thoughts, feelings, and behaviors related to these obstacles. The group will process together ways to cope with barriers, with attention given to specific choices patients can make. Each patient will be challenged to identify changes they are motivated to make in order to overcome their obstacles. This group will be process-oriented, with patients participating in exploration of their own experiences as well as giving and receiving support and challenge from other group members.  Therapeutic Goals: 1. Patient will identify personal and current obstacles as they relate to admission. 2. Patient will identify barriers that currently interfere with their wellness or overcoming obstacles.  3. Patient will identify feelings, thought process and behaviors related to these barriers. 4. Patient will identify two changes they are willing to make to overcome these obstacles:   Summary of Patient Progress  Gretchen was engaged and participated throughout the group session. Kemia reports that her main obstacle currently is not being able to smoke cigarettes. Elexis states that she also is concerned about the "stigma" that is associated with mental health once she returns home. She reports some of her family members can be "judgmental".    Therapeutic Modalities:  Cognitive Behavioral Therapy Solution Focused Therapy Motivational Interviewing Relapse Prevention Therapy   Theresa Duty Clinical Social Worker

## 2020-05-29 NOTE — [facility] Counselor (Addendum)
3:25pm- Patient denied due to requiring a Nebulizer.  CSW spoke with admissions clinician Hoyle Sauer, at [facility] [phone]). Patient is still being reviewed for appropriateness of admission. [facility] is currently at capacity, but the facility will follow up tomorrow regarding bed availability.  Stephanie Acre, MSW, LCSW-A Clinical Social Worker [facility]

## 2020-05-29 NOTE — Progress Notes (Signed)
[facility] MD Progress Note  05/29/2020 11:55 AM Jasmine Werner  MRN:  [id number] Subjective: Patient is a 64 year old female with a past psychiatric history significant for depression and cocaine dependence who presented to the [facility] emergency department on 05/25/2020 with suicidal ideation as well as hallucinations.  Objective: Patient is seen and examined.  Patient is a 64 year old female with the above-stated past psychiatric history seen in follow-up.  She looks much better than she did when she was originally admitted.  She is able to smile and engage to a degree.  She still continues to admit to some depressive symptoms, but is very worried about her ability to remain sober off the cocaine.  She would like to go to a residential treatment program.  Her current psychiatric medications include lorazepam for possible withdrawal, Trileptal, trazodone, and venlafaxine extended release.  She was having some sleep difficulties, and trazodone as well as Seroquel were attempted.  She did not tolerate the Seroquel.  She received trazodone 100 mg p.o. nightly last night, and still only slept 3 hours.  She does remain anxious somewhat depressed, but denied suicidal ideation this morning.  Her blood pressure stable.  She is mildly tachycardic this morning at a rate 110.  Her previous EKG showed a normal sinus rhythm with a QTc interval of 484.  She did have a sleep study done on 05/15/2020.  It showed periodic limb movement disorder.  Oxygen desaturation during sleep, primary snoring.  There was no significant obstruction or central sleep disordered breathing with the exception of mild snoring.  CPAP therapy was not warranted.  Her oxygen levels did desaturate through the night, and went between 84 and 90%.  Smoking cessation was recommended.  She did have severe periodic limb movements of sleep.  Medication effect from antidepressant medication she was receiving should be considered.  The most likely culprit is the  venlafaxine given the high dose.  We discussed potential treatment options for that.  A chest x-ray from 05/26/2020 showed chronic hyperinflation, coarsened interstitial and bronchitic features.  There was no acute cardiopulmonary abnormality.  There was some aortic atherosclerosis.  Principal Problem: MDD (major depressive disorder), recurrent, severe, with psychosis ([place]) Diagnosis: Principal Problem:   MDD (major depressive disorder), recurrent, severe, with psychosis ([place])  Total Time spent with patient: 20 minutes  Past Psychiatric History: See admission H&P  Past Medical History:  Past Medical History:  Diagnosis Date  . Alcohol use   . Allergy   . Anxiety   . Asthma   . Childhood schizophrenia ([place])   . Chronic abdominal pain   . Collagen vascular disease ([place])   . COPD (chronic obstructive pulmonary disease) ([place])     Past Surgical History:  Procedure Laterality Date  . ABDOMINAL HYSTERECTOMY    . APPENDECTOMY     Family History:  Family History  Problem Relation Age of Onset  . Asthma Other   . COPD Other   . Alcoholism Other   . Heart attack Other   . Depression Other   . Mental illness Other   . Alcohol abuse Mother   . Colon cancer Mother 74  . Alcohol abuse Father   . Depression Father    Family Psychiatric  History: See admission H&P Social History:  Social History   Substance and Sexual Activity  Alcohol Use Not Currently     Social History   Substance and Sexual Activity  Drug Use Yes  . Types: Marijuana, Cocaine    Social History  Socioeconomic History  . Marital status: Widowed    Spouse name: Not on file  . Number of children: 5  . Years of education: Not on file  . Highest education level: Some college, no degree  Occupational History  . Not on file  Tobacco Use  . Smoking status: Current Every Day Smoker    Packs/day: 0.25    Types: Cigarettes  . Smokeless tobacco: Never Used  Vaping Use  . Vaping Use: Never used  Substance  and Sexual Activity  . Alcohol use: Not Currently  . Drug use: Yes    Types: Marijuana, Cocaine  . Sexual activity: Never  Other Topics Concern  . Not on file  Social History Narrative  . Not on file   Social Determinants of Health   Financial Resource Strain:   . Difficulty of Paying Living Expenses:   Food Insecurity:   . Worried About Charity fundraiser in the Last Year:   . Arboriculturist in the Last Year:   Transportation Needs:   . Film/video editor (Medical):   Marland Kitchen Lack of Transportation (Non-Medical):   Physical Activity:   . Days of Exercise per Week:   . Minutes of Exercise per Session:   Stress:   . Feeling of Stress :   Social Connections:   . Frequency of Communication with Friends and Family:   . Frequency of Social Gatherings with Friends and Family:   . Attends Religious Services:   . Active Member of Clubs or Organizations:   . Attends Archivist Meetings:   Marland Kitchen Marital Status:    Additional Social History:    Pain Medications: See MAR Prescriptions: See MAR Over the Counter: See MAR History of alcohol / drug use?: Yes Longest period of sobriety (when/how long): one year Negative Consequences of Use: Financial, Personal relationships Withdrawal Symptoms: Weakness                    Sleep: Fair  Appetite:  Good  Current Medications: Current Facility-Administered Medications  Medication Dose Route Frequency Provider Last Rate Last Admin  . acetaminophen (TYLENOL) tablet 650 mg  650 mg Oral Q6H PRN Anike, Adaku C, NP   650 mg at 05/26/20 2007  . albuterol (VENTOLIN HFA) 108 (90 Base) MCG/ACT inhaler 2 puff  2 puff Inhalation Q6H Anike, Adaku C, NP   2 puff at 05/29/20 0756  . aspirin EC tablet 81 mg  81 mg Oral Daily Sharma Covert, MD   81 mg at 05/29/20 0757  . fluticasone (FLOVENT HFA) 44 MCG/ACT inhaler 2 puff  2 puff Inhalation BID Sharma Covert, MD   2 puff at 05/29/20 0758  . ipratropium-albuterol (DUONEB)  0.5-2.5 (3) MG/3ML nebulizer solution 3 mL  3 mL Nebulization Q6H PRN Sharma Covert, MD   3 mL at 05/29/20 0548  . lisinopril (ZESTRIL) tablet 10 mg  10 mg Oral Daily Sharma Covert, MD   10 mg at 05/29/20 0756  . LORazepam (ATIVAN) tablet 1 mg  1 mg Oral Q6H PRN Sharma Covert, MD      . OXcarbazepine (TRILEPTAL) tablet 75 mg  75 mg Oral BID Sharma Covert, MD   75 mg at 05/29/20 0756  . polyethylene glycol (MIRALAX / GLYCOLAX) packet 17 g  17 g Oral Daily PRN Sharma Covert, MD   17 g at 05/28/20 1609  . rosuvastatin (CRESTOR) tablet 5 mg  5 mg Oral Daily Massiah Longanecker,  Cordie Grice, MD   5 mg at 05/29/20 0757  . traZODone (DESYREL) tablet 100 mg  100 mg Oral QHS Connye Burkitt, NP   100 mg at 05/28/20 2118  . venlafaxine XR (EFFEXOR-XR) 24 hr capsule 225 mg  225 mg Oral Q breakfast Sharma Covert, MD   225 mg at 05/29/20 0757    Lab Results: No results found for this or any previous visit (from the past 48 hour(s)).  Blood Alcohol level:  Lab Results  Component Value Date   ETH <10 05/25/2020   ETH <10 51/01/5851    Metabolic Disorder Labs: Lab Results  Component Value Date   HGBA1C 6.2 (H) 06/30/2019   MPG 131.24 06/30/2019   No results found for: PROLACTIN Lab Results  Component Value Date   CHOL 191 06/30/2019   TRIG 132 06/30/2019   HDL 32 (L) 06/30/2019   CHOLHDL 6.0 06/30/2019   VLDL 26 06/30/2019   LDLCALC 133 (H) 06/30/2019    Physical Findings: AIMS: Facial and Oral Movements Muscles of Facial Expression: None, normal Lips and Perioral Area: None, normal Jaw: None, normal Tongue: None, normal,Extremity Movements Upper (arms, wrists, hands, fingers): None, normal Lower (legs, knees, ankles, toes): None, normal, Trunk Movements Neck, shoulders, hips: None, normal, Overall Severity Severity of abnormal movements (highest score from questions above): None, normal Incapacitation due to abnormal movements: None, normal Patient's awareness of  abnormal movements (rate only patient's report): No Awareness, Dental Status Current problems with teeth and/or dentures?: No Does patient usually wear dentures?: No  CIWA:  CIWA-Ar Total: 3 COWS:  COWS Total Score: 4  Musculoskeletal: Strength & Muscle Tone: within normal limits Gait & Station: normal Patient leans: N/A  Psychiatric Specialty Exam: Physical Exam Vitals and nursing note reviewed.  Constitutional:      Appearance: Normal appearance.  HENT:     Head: Normocephalic and atraumatic.  Pulmonary:     Effort: Pulmonary effort is normal.  Neurological:     General: No focal deficit present.     Mental Status: She is alert and oriented to person, place, and time.     Review of Systems  Blood pressure 110/71, pulse (!) 128, temperature 98.5 F (36.9 C), temperature source Oral, resp. rate 16, height 5\' 5"  (1.651 m), weight 85.3 kg, SpO2 90 %.Body mass index is 31.29 kg/m.  General Appearance: Casual  Eye Contact:  Fair  Speech:  Normal Rate  Volume:  Normal  Mood:  Anxious  Affect:  Congruent  Thought Process:  Coherent and Descriptions of Associations: Intact  Orientation:  Full (Time, Place, and Person)  Thought Content:  Logical  Suicidal Thoughts:  No  Homicidal Thoughts:  No  Memory:  Immediate;   Fair Recent;   Fair Remote;   Fair  Judgement:  Intact  Insight:  Fair  Psychomotor Activity:  Increased  Concentration:  Concentration: Fair and Attention Span: Fair  Recall:  [employer] of Knowledge:  Fair  Language:  Good  Akathisia:  Negative  Handed:  Right  AIMS (if indicated):     Assets:  Desire for Improvement Resilience  ADL's:  Intact  Cognition:  WNL  Sleep:  Number of Hours: 3     Treatment Plan Summary: Daily contact with patient to assess and evaluate symptoms and progress in treatment, Medication management and Plan : Patient is seen and examined.  Patient is a 64 year old female with the above-stated past psychiatric history was seen  in follow-up.   Diagnosis:  1.  Major depression 2.  Cocaine dependence 3.  Restless leg syndrome 4.  COPD 5.  History of alcohol use disorder 6.  Tachycardia 7.  Insomnia  Pertinent findings on examination today: 1.  Improved mood and anxiety. 2.  Desire for residential substance abuse treatment program 3.  Review of sleep study revealed periodic leg movement disorder which is clearly contributing to her insomnia  Plan: 1.  Discussed with social work patient's desire to go to a residential substance abuse treatment program. 2.  Continue lisinopril 10 mg p.o. daily for hypertension. 3.  EKG to reassess tachycardia. 4.  Increase Trileptal to 150 mg p.o. twice daily for anxiety and mood stability. 5.  Continue Crestor 5 mg p.o. daily for hyperlipidemia. 6.  Continue venlafaxine extended release 225 mg p.o. daily for depression and anxiety. 7.  Continue Ventolin, Flovent and DuoNeb for COPD. 8.  Add melatonin and gabapentin for restless leg syndrome. 9.  Stop trazodone 10.  Disposition planning-in progress.  Sharma Covert, MD 05/29/2020, 11:55 AM

## 2020-05-29 NOTE — Progress Notes (Signed)
D:  Patient's self inventory sheet, patient has poor sleep, no sleep medication.  Fair appetite, low energy level, good concentration.  Rated depression 9, anxiety 7.  Has cravings.  Goal is find out plan.  Goal tody is nap.  No discharge plans. A:  Medications administered per MD orders.  Emotional support and encouragement given patient. R:  Denied SI and HI, contracts for safety.  Denied A/V hallucinations.  Safety maintained with 15 minute checks.

## 2020-05-30 MED ORDER — VENLAFAXINE HCL ER 37.5 MG PO CP24
187.5000 mg | ORAL_CAPSULE | Freq: Every day | ORAL | Status: DC
  Administered 2020-05-31: 187.5 mg via ORAL
  Filled 2020-05-30 (×3): qty 1

## 2020-05-30 MED ORDER — DIVALPROEX SODIUM 250 MG PO DR TAB
250.0000 mg | DELAYED_RELEASE_TABLET | Freq: Two times a day (BID) | ORAL | Status: DC
  Administered 2020-05-30 – 2020-05-31 (×2): 250 mg via ORAL
  Filled 2020-05-30 (×6): qty 1

## 2020-05-30 NOTE — Progress Notes (Signed)
   05/29/20 2040  Psych Admission Type (Psych Patients Only)  Admission Status Voluntary  Psychosocial Assessment  Patient Complaints Anxiety;Crying spells;Depression;Sadness  Eye Contact Fair  Facial Expression Anxious;Sad  Affect Blunted;Depressed;Sad  Speech Logical/coherent  Interaction Assertive;Arrogant  Motor Activity Slow  Appearance/Hygiene In scrubs  Behavior Characteristics Anxious;Cooperative  Mood Depressed;Anxious;Sad;Pleasant  Thought Process  Coherency WDL  Content Preoccupation  Delusions None reported or observed  Perception WDL  Hallucination None reported or observed  Judgment Poor  Confusion None  Danger to Self  Current suicidal ideation? Denies  Self-Injurious Behavior No self-injurious ideation or behavior indicators observed or expressed   Agreement Not to Harm Self Yes  Description of Agreement verbal  Danger to Others  Danger to Others None reported or observed

## 2020-05-30 NOTE — Progress Notes (Signed)
   05/30/20 0551  Vital Signs  Pulse Rate 79  Pulse Rate Source Dinamap  BP (!) 139/126  BP Location Right Arm  BP Method Automatic  Patient Position (if appropriate) Standing   D:  Patient out in open areas and was social with staff and peers. Patient took all medicine. Patient denies SI/HI and AVH A:  Patient took scheduled medicine.  Support and encouragement provided Routine safety checks conducted every 15 minutes. Patient  Informed to notify staff with any concerns.   R:  Safety maintained.

## 2020-05-30 NOTE — Progress Notes (Signed)
Pt communicated that she feels like her trileptal is not working for her. Pt complains that it's been causing her excessive sweating and teeth grinding. Will report off to oncoming shift. No distress noted.

## 2020-05-30 NOTE — Progress Notes (Signed)
   05/29/20 2040  COVID-19 Daily Checkoff  Have you had a fever (temp > 37.80C/100F)  in the past 24 hours?  No  COVID-19 EXPOSURE  Have you traveled outside the state in the past 14 days? No  Have you been in contact with someone with a confirmed diagnosis of COVID-19 or PUI in the past 14 days without wearing appropriate PPE? No  Have you been living in the same home as a person with confirmed diagnosis of COVID-19 or a PUI (household contact)? No  Have you been diagnosed with COVID-19? No

## 2020-05-30 NOTE — Progress Notes (Signed)
Recreation Therapy Notes  Animal-Assisted Activity (AAA) Program Checklist/Progress Notes Patient Eligibility Criteria Checklist & Daily Group note for Rec Tx Intervention  Date: 6.29.21 Time: 36 Location: [address]   AAA/T Program Assumption of Risk Form signed by Teacher, music or Parent Legal Guardian  YES   Patient is free of allergies or sever asthma  YES  Patient reports no fear of animals YES   Patient reports no history of cruelty to animals YES   Patient understands his/her participation is voluntary YES  Patient washes hands before animal contact YES  Patient washes hands after animal contact YES   Behavioral Response: Engaged  Education: Contractor, Appropriate Animal Interaction   Education Outcome: Acknowledges understanding/In group clarification offered/Needs additional education.   Clinical Observations/Feedback: Pt attended and participated in activity.    Victorino Sparrow, LRT/CTRS         Victorino Sparrow A 05/30/2020 3:36 PM

## 2020-05-30 NOTE — Progress Notes (Signed)
Patient refused trileptal 75 mg this a.m.  Stated this med causes her to grind teeth, sweating, headaches, crying daily.

## 2020-05-30 NOTE — Progress Notes (Signed)
[facility] MD Progress Note  05/30/2020 2:46 PM Jasmine Werner  MRN:  [id number]  Patient is a 64 year old female with a past psychiatric history significant for depression and cocaine dependence who presented to the [facility] emergency department on 05/25/2020 with suicidal ideation as well as hallucinations.  On assessment today, patient is significantly irritable. She is asking about changing from Effexor to another medication. I attempted to clarify with patient, as she has previously reported several times that she does well on this medication during periods of sobriety. She became agitated and stated "Well clearly it's not working because I'm here." On further discussion, she states her brother had called her last night and reminded her that she did not do well on Effexor when she took it in her 35s- she states she became impulsive and hypersexual at that time. She has been on Effexor for several years now but states she would like to try a different medication for depression. Patient was advised that Effexor taper is a long process that she will not be able to complete in the hospital, and she states understanding.   Patient is also stating today that she will not take the Trileptal anymore because it is giving her bruxism and a headache. She had been started on this medication on 05/26/20, and this is the first mention of these side effects. She told the nurse this morning that she had been "crying all day" yesterday but appeared bright and euthymic each time seen by this provider yesterday. She is requesting residential rehab. She states, "If you send me home this week, I will die." She reports SI to jump over a waterfall near her house or to overdose on cocaine. She has been visible and interacting in the milieu, mostly appearing euthymic but irritable at times. We discussed initiating Depakote, and patient agreeable to this medication change. She does report improved sleep last night with new medications  gabapentin and melatonin at bedtime.  Principal Problem: MDD (major depressive disorder), recurrent, severe, with psychosis ([place]) Diagnosis: Principal Problem:   MDD (major depressive disorder), recurrent, severe, with psychosis ([place])  Total Time spent with patient: 20 minutes  Past Psychiatric History: See admission H&P  Past Medical History:  Past Medical History:  Diagnosis Date  . Alcohol use   . Allergy   . Anxiety   . Asthma   . Childhood schizophrenia ([place])   . Chronic abdominal pain   . Collagen vascular disease ([place])   . COPD (chronic obstructive pulmonary disease) ([place])     Past Surgical History:  Procedure Laterality Date  . ABDOMINAL HYSTERECTOMY    . APPENDECTOMY     Family History:  Family History  Problem Relation Age of Onset  . Asthma Other   . COPD Other   . Alcoholism Other   . Heart attack Other   . Depression Other   . Mental illness Other   . Alcohol abuse Mother   . Colon cancer Mother 60  . Alcohol abuse Father   . Depression Father    Family Psychiatric  History: See admission H&P Social History:  Social History   Substance and Sexual Activity  Alcohol Use Not Currently     Social History   Substance and Sexual Activity  Drug Use Yes  . Types: Marijuana, Cocaine    Social History   Socioeconomic History  . Marital status: Widowed    Spouse name: Not on file  . Number of children: 5  . Years of education: Not  on file  . Highest education level: Some college, no degree  Occupational History  . Not on file  Tobacco Use  . Smoking status: Current Every Day Smoker    Packs/day: 0.25    Types: Cigarettes  . Smokeless tobacco: Never Used  Vaping Use  . Vaping Use: Never used  Substance and Sexual Activity  . Alcohol use: Not Currently  . Drug use: Yes    Types: Marijuana, Cocaine  . Sexual activity: Never  Other Topics Concern  . Not on file  Social History Narrative  . Not on file   Social Determinants of Health    Financial Resource Strain:   . Difficulty of Paying Living Expenses:   Food Insecurity:   . Worried About Charity fundraiser in the Last Year:   . Arboriculturist in the Last Year:   Transportation Needs:   . Film/video editor (Medical):   Marland Kitchen Lack of Transportation (Non-Medical):   Physical Activity:   . Days of Exercise per Week:   . Minutes of Exercise per Session:   Stress:   . Feeling of Stress :   Social Connections:   . Frequency of Communication with Friends and Family:   . Frequency of Social Gatherings with Friends and Family:   . Attends Religious Services:   . Active Member of Clubs or Organizations:   . Attends Archivist Meetings:   Marland Kitchen Marital Status:    Additional Social History:    Pain Medications: See MAR Prescriptions: See MAR Over the Counter: See MAR History of alcohol / drug use?: Yes Longest period of sobriety (when/how long): one year Negative Consequences of Use: Financial, Personal relationships Withdrawal Symptoms: Weakness                    Sleep: Good  Appetite:  Good  Current Medications: Current Facility-Administered Medications  Medication Dose Route Frequency Provider Last Rate Last Admin  . acetaminophen (TYLENOL) tablet 650 mg  650 mg Oral Q6H PRN Anike, Adaku C, NP   650 mg at 05/30/20 0422  . albuterol (VENTOLIN HFA) 108 (90 Base) MCG/ACT inhaler 2 puff  2 puff Inhalation Q6H Anike, Adaku C, NP   2 puff at 05/30/20 1400  . aspirin EC tablet 81 mg  81 mg Oral Daily Sharma Covert, MD   81 mg at 05/30/20 0721  . divalproex (DEPAKOTE) DR tablet 250 mg  250 mg Oral Q12H Connye Burkitt, NP      . fluticasone (FLOVENT HFA) 44 MCG/ACT inhaler 2 puff  2 puff Inhalation BID Sharma Covert, MD   2 puff at 05/30/20 [phone]  . gabapentin (NEURONTIN) capsule 300 mg  300 mg Oral QHS Connye Burkitt, NP   300 mg at 05/29/20 2106  . ipratropium-albuterol (DUONEB) 0.5-2.5 (3) MG/3ML nebulizer solution 3 mL  3 mL  Nebulization Q6H PRN Sharma Covert, MD   3 mL at 05/30/20 0535  . lisinopril (ZESTRIL) tablet 10 mg  10 mg Oral Daily Sharma Covert, MD   10 mg at 05/30/20 [phone]  . LORazepam (ATIVAN) tablet 1 mg  1 mg Oral Q6H PRN Sharma Covert, MD      . melatonin tablet 10 mg  10 mg Oral QHS Connye Burkitt, NP   10 mg at 05/29/20 2106  . polyethylene glycol (MIRALAX / GLYCOLAX) packet 17 g  17 g Oral Daily PRN Sharma Covert, MD   17 g  at 05/29/20 2044  . rosuvastatin (CRESTOR) tablet 5 mg  5 mg Oral Daily Sharma Covert, MD   5 mg at 05/30/20 0723  . venlafaxine XR (EFFEXOR-XR) 24 hr capsule 225 mg  225 mg Oral Q breakfast Sharma Covert, MD   225 mg at 05/30/20 1610    Lab Results: No results found for this or any previous visit (from the past 48 hour(s)).  Blood Alcohol level:  Lab Results  Component Value Date   ETH <10 05/25/2020   ETH <10 96/03/5408    Metabolic Disorder Labs: Lab Results  Component Value Date   HGBA1C 6.2 (H) 06/30/2019   MPG 131.24 06/30/2019   No results found for: PROLACTIN Lab Results  Component Value Date   CHOL 191 06/30/2019   TRIG 132 06/30/2019   HDL 32 (L) 06/30/2019   CHOLHDL 6.0 06/30/2019   VLDL 26 06/30/2019   LDLCALC 133 (H) 06/30/2019    Physical Findings: AIMS: Facial and Oral Movements Muscles of Facial Expression: None, normal Lips and Perioral Area: None, normal Jaw: None, normal Tongue: None, normal,Extremity Movements Upper (arms, wrists, hands, fingers): None, normal Lower (legs, knees, ankles, toes): None, normal, Trunk Movements Neck, shoulders, hips: None, normal, Overall Severity Severity of abnormal movements (highest score from questions above): None, normal Incapacitation due to abnormal movements: None, normal Patient's awareness of abnormal movements (rate only patient's report): No Awareness, Dental Status Current problems with teeth and/or dentures?: No Does patient usually wear dentures?: No  CIWA:   CIWA-Ar Total: 3 COWS:  COWS Total Score: 4  Musculoskeletal: Strength & Muscle Tone: within normal limits Gait & Station: normal Patient leans: N/A  Psychiatric Specialty Exam: Physical Exam Vitals and nursing note reviewed.  Constitutional:      Appearance: She is well-developed.  Cardiovascular:     Rate and Rhythm: Normal rate.  Pulmonary:     Effort: Pulmonary effort is normal.  Neurological:     Mental Status: She is alert and oriented to person, place, and time.     Review of Systems  Constitutional: Negative.   Neurological: Positive for headaches.  Psychiatric/Behavioral: Positive for agitation, dysphoric mood and suicidal ideas. Negative for behavioral problems, confusion, decreased concentration, hallucinations, self-injury and sleep disturbance. The patient is not nervous/anxious and is not hyperactive.     Blood pressure (!) 139/126, pulse 79, temperature 98.6 F (37 C), temperature source Oral, resp. rate 20, height 5\' 5"  (1.651 m), weight 85.3 kg, SpO2 92 %.Body mass index is 31.29 kg/m.  General Appearance: Casual  Eye Contact:  Fair  Speech:  Pressured  Volume:  Increased  Mood:  Irritable  Affect:  Congruent  Thought Process:  Coherent  Orientation:  Full (Time, Place, and Person)  Thought Content:  Logical  Suicidal Thoughts:  Yes.  with intent/plan No plan or intent on the unit.  Homicidal Thoughts:  No  Memory:  Immediate;   Fair Recent;   Fair  Judgement:  Fair  Insight:  Lacking  Psychomotor Activity:  Normal  Concentration:  Concentration: Fair and Attention Span: Poor  Recall:  [employer] of Knowledge:  Fair  Language:  Good  Akathisia:  No  Handed:  Right  AIMS (if indicated):     Assets:  Communication Skills Housing Leisure Time  ADL's:  Intact  Cognition:  WNL  Sleep:  Number of Hours: 5.75     Treatment Plan Summary: Daily contact with patient to assess and evaluate symptoms and progress in treatment  and Medication  management   Continue inpatient hospitalization.  Discontinue Trileptal Start Depakote 250 mg PO BID for mood instability Decrease Effexor XR to 187.5 mg PO daily for depression/anxiety Continue gabapentin 300 mg PO QHS for RLS Continue melatonin 10 mg PO QHS for insomnia Continue ASA 81 mg PO daily for antiplatelet Continue fluticasone 44 mcg BID for COPD Continue Duoneb neb treatment Q6HR PRN SOB Continue lisinopril 10 mg PO daily for HTN Continue Crestor 5 mg PO daily for HLD Continue Ativan 1 mg PO Q6HR PRN CIWA>10 for withdrawal  Patient will participate in the therapeutic group milieu.  Discharge disposition in progress.   Connye Burkitt, NP 05/30/2020, 2:46 PM

## 2020-05-31 ENCOUNTER — Telehealth (HOSPITAL_COMMUNITY): Payer: Self-pay | Admitting: Psychiatry

## 2020-05-31 MED ORDER — DIVALPROEX SODIUM 250 MG PO DR TAB
250.0000 mg | DELAYED_RELEASE_TABLET | Freq: Every day | ORAL | Status: DC
  Administered 2020-06-01: 250 mg via ORAL
  Filled 2020-05-31 (×3): qty 1

## 2020-05-31 MED ORDER — VENLAFAXINE HCL ER 150 MG PO CP24
150.0000 mg | ORAL_CAPSULE | Freq: Every day | ORAL | Status: DC
  Administered 2020-06-01: 150 mg via ORAL
  Filled 2020-05-31 (×2): qty 1

## 2020-05-31 MED ORDER — DIVALPROEX SODIUM 500 MG PO DR TAB
500.0000 mg | DELAYED_RELEASE_TABLET | Freq: Every evening | ORAL | Status: DC
  Administered 2020-05-31: 500 mg via ORAL
  Filled 2020-05-31 (×2): qty 1

## 2020-05-31 MED ORDER — GABAPENTIN 400 MG PO CAPS
400.0000 mg | ORAL_CAPSULE | Freq: Every day | ORAL | Status: DC
  Administered 2020-05-31: 400 mg via ORAL
  Filled 2020-05-31 (×3): qty 1

## 2020-05-31 NOTE — Tx Team (Signed)
Interdisciplinary Treatment and Diagnostic Plan Update  05/31/2020 Time of Session: 9:10am Jasmine Werner MRN: [id number]  Principal Diagnosis: MDD (major depressive disorder), recurrent, severe, with psychosis ([place])  Secondary Diagnoses: Principal Problem:   MDD (major depressive disorder), recurrent, severe, with psychosis ([place])   Current Medications:  Current Facility-Administered Medications  Medication Dose Route Frequency Provider Last Rate Last Admin  . acetaminophen (TYLENOL) tablet 650 mg  650 mg Oral Q6H PRN Anike, Adaku C, NP   650 mg at 05/30/20 0422  . albuterol (VENTOLIN HFA) 108 (90 Base) MCG/ACT inhaler 2 puff  2 puff Inhalation Q6H Anike, Adaku C, NP   2 puff at 05/30/20 2111  . aspirin EC tablet 81 mg  81 mg Oral Daily Sharma Covert, MD   81 mg at 05/31/20 0750  . divalproex (DEPAKOTE) DR tablet 250 mg  250 mg Oral Q12H Connye Burkitt, NP   250 mg at 05/31/20 0750  . fluticasone (FLOVENT HFA) 44 MCG/ACT inhaler 2 puff  2 puff Inhalation BID Sharma Covert, MD   2 puff at 05/31/20 0749  . gabapentin (NEURONTIN) capsule 300 mg  300 mg Oral QHS Connye Burkitt, NP   300 mg at 05/30/20 2110  . ipratropium-albuterol (DUONEB) 0.5-2.5 (3) MG/3ML nebulizer solution 3 mL  3 mL Nebulization Q6H PRN Sharma Covert, MD   3 mL at 05/30/20 0535  . lisinopril (ZESTRIL) tablet 10 mg  10 mg Oral Daily Sharma Covert, MD   10 mg at 05/31/20 0750  . LORazepam (ATIVAN) tablet 1 mg  1 mg Oral Q6H PRN Sharma Covert, MD      . melatonin tablet 10 mg  10 mg Oral QHS Connye Burkitt, NP   10 mg at 05/30/20 2109  . polyethylene glycol (MIRALAX / GLYCOLAX) packet 17 g  17 g Oral Daily PRN Sharma Covert, MD   17 g at 05/29/20 2044  . rosuvastatin (CRESTOR) tablet 5 mg  5 mg Oral Daily Sharma Covert, MD   5 mg at 05/31/20 0750  . venlafaxine XR (EFFEXOR-XR) 24 hr capsule 187.5 mg  187.5 mg Oral Q breakfast Connye Burkitt, NP   187.5 mg at 05/31/20 0750   PTA  Medications: Medications Prior to Admission  Medication Sig Dispense Refill Last Dose  . ASPIRIN 81 PO Take 1 tablet by mouth daily.      . budesonide-formoterol (SYMBICORT) 160-4.5 MCG/ACT inhaler Inhale 2 puffs into the lungs 2 (two) times daily.     . busPIRone (BUSPAR) 5 MG tablet Take 1 tablet (5 mg total) by mouth 3 (three) times daily. 90 tablet 0   . ipratropium-albuterol (DUONEB) 0.5-2.5 (3) MG/3ML SOLN Inhale 3 mLs into the lungs every 6 (six) hours as needed (Shortness of Breath).  (Patient not taking: Reported on 05/25/2020)  2   . lisinopril (ZESTRIL) 10 MG tablet Take 1 tablet (10 mg total) by mouth daily. 30 tablet 0   . MELATONIN PO Take 10 mg by mouth daily.      . polyethylene glycol-electrolytes (TRILYTE) 420 g solution Take 4,000 mLs by mouth as directed. (Patient not taking: Reported on 04/25/2020) 4000 mL 0   . rosuvastatin (CRESTOR) 5 MG tablet Take 5 mg by mouth daily.     . traZODone (DESYREL) 100 MG tablet Take 1 tablet (100 mg total) by mouth at bedtime as needed for sleep. 90 tablet 0   . varenicline (CHANTIX PAK) 0.5 MG X 11 & 1 MG  X 42 tablet Take 1 mg by mouth 2 (two) times daily.      Marland Kitchen venlafaxine XR (EFFEXOR-XR) 75 MG 24 hr capsule Take 3 capsules (225 mg total) by mouth daily. 270 capsule 0   . VENTOLIN HFA 108 (90 BASE) MCG/ACT inhaler Inhale 1 puff into the lungs every 6 (six) hours as needed for shortness of breath.   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   Physician Treatment Plan for Primary Diagnosis: MDD (major depressive disorder), recurrent, severe, with psychosis ([place]) Long Term Goal(s): Improvement in symptoms so as ready for discharge Improvement in  symptoms so as ready for discharge   Short Term Goals: Ability to identify changes in lifestyle to reduce recurrence of condition will improve Ability to verbalize feelings will improve Ability to disclose and discuss suicidal ideas Ability to identify and develop effective coping behaviors will improve Compliance with prescribed medications will improve Ability to identify triggers associated with substance abuse/mental health issues will improve  Medication Management: Evaluate patient's response, side effects, and tolerance of medication regimen.  Therapeutic Interventions: 1 to 1 sessions, Unit Group sessions and Medication administration.  Evaluation of Outcomes: Progressing  Physician Treatment Plan for Secondary Diagnosis: Principal Problem:   MDD (major depressive disorder), recurrent, severe, with psychosis ([place])  Long Term Goal(s): Improvement in symptoms so as ready for discharge Improvement in symptoms so as ready for discharge   Short Term Goals: Ability to identify changes in lifestyle to reduce recurrence of condition will improve Ability to verbalize feelings will improve Ability to disclose and discuss suicidal ideas Ability to identify and develop effective coping behaviors will improve Compliance with prescribed medications will improve Ability to identify triggers associated with substance abuse/mental health issues will improve     Medication Management: Evaluate patient's response, side effects, and tolerance of medication regimen.  Therapeutic Interventions: 1 to 1 sessions, Unit Group sessions and Medication administration.  Evaluation of Outcomes: Progressing   RN Treatment Plan for Primary Diagnosis: MDD (major depressive disorder), recurrent, severe, with psychosis ([place]) Long Term Goal(s): Knowledge of disease and therapeutic regimen to maintain health will improve  Short Term Goals: Ability to verbalize feelings will improve, Ability to disclose and  discuss suicidal ideas, Ability to identify and develop effective coping behaviors will improve and Compliance with prescribed medications will improve  Medication Management: RN will administer medications as ordered by provider, will assess and evaluate patient's response and provide education to patient for prescribed medication. RN will report any adverse and/or side effects to prescribing provider.  Therapeutic Interventions: 1 on 1 counseling sessions, Psychoeducation, Medication administration, Evaluate responses to treatment, Monitor vital signs and CBGs as ordered, Perform/monitor CIWA, COWS, AIMS and Fall Risk screenings as ordered, Perform wound care treatments as ordered.  Evaluation of Outcomes: Progressing   LCSW Treatment Plan for Primary Diagnosis: MDD (major depressive disorder), recurrent, severe, with psychosis ([place]) Long Term Goal(s): Safe transition to appropriate next level of care at discharge, Engage patient in therapeutic group addressing interpersonal concerns.  Short Term Goals: Engage patient in aftercare planning with referrals and resources  Therapeutic Interventions: Assess for all discharge needs, 1 to 1 time with Social worker, Explore available resources and support systems, Assess for adequacy in community support network, Educate family and significant other(s) on suicide  prevention, Complete Psychosocial Assessment, Interpersonal group therapy.  Evaluation of Outcomes: Progressing   Progress in Treatment: Attending groups: Yes. Participating in groups: Yes. Taking medication as prescribed: Yes. Toleration medication: Yes. Family/Significant other contact made: No, will contact:  if patient consents to collateral contacts Patient understands diagnosis: Yes. Discussing patient identified problems/goals with staff: Yes. Medical problems stabilized or resolved: Yes. Denies suicidal/homicidal ideation: No. Issues/concerns per patient self-inventory:  No. Other:   New problem(s) identified: None   New Short Term/Long Term Goal(s): Detox, medication stabilization, elimination of SI thoughts, development of comprehensive mental wellness plan.    Patient Goals:  "I want to be on the right medications, I want to be sane, I want to be able to function, I want to stay sober. I was hearing voices that were very condemning. My breathing is very bad"   Discharge Plan or Barriers: Patient recently admitted. CSW will continue to follow and assess for appropriate referrals and possible discharge planning.    Reason for Continuation of Hospitalization: Anxiety Depression Other; describe Substance abuse  Estimated Length of Stay: 3-5 days   Attendees: Patient: Jasmine Werner  05/31/2020 11:57 AM  Physician: Dr. Myles Lipps, MD 05/31/2020 11:57 AM  Nursing:  05/31/2020 11:57 AM  RN Care Manager: 05/31/2020 11:57 AM  Social Worker: Radonna Ricker, LCSW 05/31/2020 11:57 AM  Recreational Therapist:  05/31/2020 11:57 AM  Other:  05/31/2020 11:57 AM  Other:  05/31/2020 11:57 AM  Other: 05/31/2020 11:57 AM    Scribe for Treatment Team: Marylee Floras, [place] 05/31/2020 11:57 AM

## 2020-05-31 NOTE — Progress Notes (Signed)
Patient shared in group that her positive event for the day is that she is now taking new medication. Her goal for tomorrow is to speak with the staff regarding her discharge plans.

## 2020-05-31 NOTE — Progress Notes (Signed)
[facility] MD Progress Note  05/31/2020 1:59 PM Jasmine Werner  MRN:  [id number] Subjective:  Patient is a 64 year old female with a past psychiatric history significant for depression and cocaine dependence who presented to the [facility] ([place] emergency department on 05/25/2020 with suicidal ideation as well as hallucinations. The patient stated she was having worsening depressive symptoms. She stated that she had had 7 previous suicide attempts.  Objective: Patient is seen and examined.  Patient is a 64 year old female with the above-stated past psychiatric history seen in follow-up.  There was consideration of discharging the patient home today.  She had not been able to be accepted into a residential substance abuse treatment program, and we were arranging for her to be involved in an intensive outpatient program.  Upon discussion today over those circumstances when told that we are attempting to get these things in place and that she had not been accepted into a residential treatment program she stated "so my only option is suicide".  The patient stated that she had been in [place] last year for several months.  She has a previous history of 7 suicide attempts.  Surprisingly this was said without real affect involved.  Prior to saying that she was suicidal she wanted to stay because her family was going to be at the beach for several days as well.  She stated that going home was not the issue because she lives alone.  She stated that she had told nursing that she did not have to use the nebulizer treatment.  Apparently one of the facilities did not want to accept her because the nebulizer treatments.  Nursing had already discussed this with me and I told them that they could stop those.  She stated that the medication that she received last night helped quite well with her restless legs issues.  Nursing notes reflect that she has been becoming inappropriately involved with other patients.  She is attempting to  tell the staff what to do to help the other patients.  Last night she denied any suicidal ideation and contracted for safety.  Review of the nursing notes earlier yesterday revealed that she denied suicidal ideation homicidal ideation or auditory and visual hallucinations.  Her vital signs are stable, she is afebrile.  She slept 5.75 hours last night.  Principal Problem: MDD (major depressive disorder), recurrent, severe, with psychosis ([place]) Diagnosis: Principal Problem:   MDD (major depressive disorder), recurrent, severe, with psychosis ([place])  Total Time spent with patient: 30 minutes  Past Psychiatric History: See admission H&P  Past Medical History:  Past Medical History:  Diagnosis Date   Alcohol use    Allergy    Anxiety    Asthma    Childhood schizophrenia ([place])    Chronic abdominal pain    Collagen vascular disease ([place])    COPD (chronic obstructive pulmonary disease) ([place])     Past Surgical History:  Procedure Laterality Date   ABDOMINAL HYSTERECTOMY     APPENDECTOMY     Family History:  Family History  Problem Relation Age of Onset   Asthma Other    COPD Other    Alcoholism Other    Heart attack Other    Depression Other    Mental illness Other    Alcohol abuse Mother    Colon cancer Mother 69   Alcohol abuse Father    Depression Father    Family Psychiatric  History: See admission H&P Social History:  Social History   Substance and Sexual  Activity  Alcohol Use Not Currently     Social History   Substance and Sexual Activity  Drug Use Yes   Types: Marijuana, Cocaine    Social History   Socioeconomic History   Marital status: Widowed    Spouse name: Not on file   Number of children: 5   Years of education: Not on file   Highest education level: Some college, no degree  Occupational History   Not on file  Tobacco Use   Smoking status: Current Every Day Smoker    Packs/day: 0.25    Types: Cigarettes   Smokeless  tobacco: Never Used  Vaping Use   Vaping Use: Never used  Substance and Sexual Activity   Alcohol use: Not Currently   Drug use: Yes    Types: Marijuana, Cocaine   Sexual activity: Never  Other Topics Concern   Not on file  Social History Narrative   Not on file   Social Determinants of Health   Financial Resource Strain:    Difficulty of Paying Living Expenses:   Food Insecurity:    Worried About Charity fundraiser in the Last Year:    Arboriculturist in the Last Year:   Transportation Needs:    Film/video editor (Medical):    Lack of Transportation (Non-Medical):   Physical Activity:    Days of Exercise per Week:    Minutes of Exercise per Session:   Stress:    Feeling of Stress :   Social Connections:    Frequency of Communication with Friends and Family:    Frequency of Social Gatherings with Friends and Family:    Attends Religious Services:    Active Member of Clubs or Organizations:    Attends Archivist Meetings:    Marital Status:    Additional Social History:    Pain Medications: See MAR Prescriptions: See MAR Over the Counter: See MAR History of alcohol / drug use?: Yes Longest period of sobriety (when/how long): one year Negative Consequences of Use: Financial, Personal relationships Withdrawal Symptoms: Weakness                    Sleep: Fair  Appetite:  Good  Current Medications: Current Facility-Administered Medications  Medication Dose Route Frequency Provider Last Rate Last Admin   acetaminophen (TYLENOL) tablet 650 mg  650 mg Oral Q6H PRN Anike, Adaku C, NP   650 mg at 05/30/20 0422   albuterol (VENTOLIN HFA) 108 (90 Base) MCG/ACT inhaler 2 puff  2 puff Inhalation Q6H Anike, Adaku C, NP   2 puff at 05/30/20 2111   aspirin EC tablet 81 mg  81 mg Oral Daily Sharma Covert, MD   81 mg at 05/31/20 0750   divalproex (DEPAKOTE) DR tablet 250 mg  250 mg Oral Q12H Connye Burkitt, NP   250 mg at  05/31/20 0750   fluticasone (FLOVENT HFA) 44 MCG/ACT inhaler 2 puff  2 puff Inhalation BID Sharma Covert, MD   2 puff at 05/31/20 0749   gabapentin (NEURONTIN) capsule 300 mg  300 mg Oral QHS Connye Burkitt, NP   300 mg at 05/30/20 2110   ipratropium-albuterol (DUONEB) 0.5-2.5 (3) MG/3ML nebulizer solution 3 mL  3 mL Nebulization Q6H PRN Sharma Covert, MD   3 mL at 05/30/20 0535   lisinopril (ZESTRIL) tablet 10 mg  10 mg Oral Daily Sharma Covert, MD   10 mg at 05/31/20 0750   LORazepam (  ATIVAN) tablet 1 mg  1 mg Oral Q6H PRN Sharma Covert, MD       melatonin tablet 10 mg  10 mg Oral QHS Connye Burkitt, NP   10 mg at 05/30/20 2109   polyethylene glycol (MIRALAX / GLYCOLAX) packet 17 g  17 g Oral Daily PRN Sharma Covert, MD   17 g at 05/29/20 2044   rosuvastatin (CRESTOR) tablet 5 mg  5 mg Oral Daily Sharma Covert, MD   5 mg at 05/31/20 0750   venlafaxine XR (EFFEXOR-XR) 24 hr capsule 187.5 mg  187.5 mg Oral Q breakfast Connye Burkitt, NP   187.5 mg at 05/31/20 0750    Lab Results: No results found for this or any previous visit (from the past 48 hour(s)).  Blood Alcohol level:  Lab Results  Component Value Date   ETH <10 05/25/2020   ETH <10 62/37/6283    Metabolic Disorder Labs: Lab Results  Component Value Date   HGBA1C 6.2 (H) 06/30/2019   MPG 131.24 06/30/2019   No results found for: PROLACTIN Lab Results  Component Value Date   CHOL 191 06/30/2019   TRIG 132 06/30/2019   HDL 32 (L) 06/30/2019   CHOLHDL 6.0 06/30/2019   VLDL 26 06/30/2019   LDLCALC 133 (H) 06/30/2019    Physical Findings: AIMS: Facial and Oral Movements Muscles of Facial Expression: None, normal Lips and Perioral Area: None, normal Jaw: None, normal Tongue: None, normal,Extremity Movements Upper (arms, wrists, hands, fingers): None, normal Lower (legs, knees, ankles, toes): None, normal, Trunk Movements Neck, shoulders, hips: None, normal, Overall  Severity Severity of abnormal movements (highest score from questions above): None, normal Incapacitation due to abnormal movements: None, normal Patient's awareness of abnormal movements (rate only patient's report): No Awareness, Dental Status Current problems with teeth and/or dentures?: No Does patient usually wear dentures?: No  CIWA:  CIWA-Ar Total: 3 COWS:  COWS Total Score: 4  Musculoskeletal: Strength & Muscle Tone: within normal limits Gait & Station: normal Patient leans: N/A  Psychiatric Specialty Exam: Physical Exam Vitals and nursing note reviewed.  Constitutional:      Appearance: Normal appearance. She is obese.  Pulmonary:     Effort: Pulmonary effort is normal.  Neurological:     General: No focal deficit present.     Mental Status: She is alert and oriented to person, place, and time.     Review of Systems  Blood pressure 116/81, pulse (!) 115, temperature 98.4 F (36.9 C), temperature source Oral, resp. rate 16, height 5\' 5"  (1.651 m), weight 85.3 kg, SpO2 92 %.Body mass index is 31.29 kg/m.  General Appearance: Casual  Eye Contact:  Good  Speech:  Normal Rate  Volume:  Normal  Mood:  Euthymic  Affect:  Congruent  Thought Process:  Coherent and Descriptions of Associations: Intact  Orientation:  Full (Time, Place, and Person)  Thought Content:  Logical  Suicidal Thoughts:  Yes.  without intent/plan  Homicidal Thoughts:  No  Memory:  Immediate;   Fair Recent;   Fair Remote;   Fair  Judgement:  Impaired  Insight:  Lacking  Psychomotor Activity:  Normal  Concentration:  Concentration: Fair and Attention Span: Fair  Recall:  [employer] of Knowledge:  Fair  Language:  Fair  Akathisia:  Negative  Handed:  Right  AIMS (if indicated):     Assets:  Desire for Improvement Resilience  ADL's:  Intact  Cognition:  WNL  Sleep:  Number  of Hours: 5.75     Treatment Plan Summary: Daily contact with patient to assess and evaluate symptoms and progress  in treatment, Medication management and Plan Patient is seen and examined.  Patient is a 64 year old female with the above-stated past psychiatric history who is seen in follow-up.   Diagnosis: 1.  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  Cocaine dependence 3.  COPD 4.  Hypertension 5.  Hyperlipidemia 6.  Restless leg syndrome 7.  Obesity  Pertinent findings on examination today: 1.  Initially euthymic, but on explanation that she had not been accepted into residential treatment and would have to be discharged and and participate in intensive outpatient stated "so my only option is suicide". 2.  Improved sleep 3.  Mild tachycardia 4.  Request cessation of nebulizer treatments  Plan: 1.  Increase Depakote to 250 mg p.o. daily and 500 mg p.o. every afternoon for mood stability. 2.  Increase gabapentin to 400 mg p.o. nightly for periodic leg movement disorder. 3.  Continue handheld inhalers as needed as previously written including Ventolin and Flovent. 4.  Stop nebulizer treatments at least at this point. 5.  Continue Zestril 10 mg p.o. daily for hypertension. 6.  Stop lorazepam. 7.  Continue MiraLAX 17 g in 8 ounces of water p.o. daily as needed constipation. 8.  Continue Crestor 5 mg p.o. daily for hyperlipidemia. 9.  Continue melatonin 10 mg p.o. nightly for sleep and restless leg syndrome. 10.  Continue venlafaxine extended release but decrease dosage to 150 mg p.o. daily in an attempt to reduce sweating and side effects. 11.  Spoke to social work about the possibility of the patient returning to the [facility] facility. 12.  Discussed with social work reattempt to have patient accepted into a residential substance abuse treatment program if off nebulizer treatments. 13.  Disposition planning-in progress. Sharma Covert, MD 05/31/2020, 1:59 PM

## 2020-05-31 NOTE — Progress Notes (Signed)
Pt has been present in the milieu. Pt has been observed wanting to know every bit of things going around, want to know what is going on with other patients. Telling staff what to do, and commanding staff around. Pt is not responding to redirections and wants to do what she wants to do. Denied SI and contracted for safety, support and encouragement offered as needed , will continue to monitor.

## 2020-05-31 NOTE — Telephone Encounter (Signed)
Patient's daughter advised she is in the [facility] hospital, patient just wanted Dr. Modesta Messing to be aware.

## 2020-06-01 ENCOUNTER — Ambulatory Visit (HOSPITAL_COMMUNITY): Payer: Medicare Other | Admitting: Clinical

## 2020-06-01 MED ORDER — VENLAFAXINE HCL ER 150 MG PO CP24: 150.0000 mg | ORAL_CAPSULE | Freq: Every day | ORAL | 0 refills | Status: DC

## 2020-06-01 MED ORDER — FLUTICASONE PROPIONATE HFA 44 MCG/ACT IN AERO: 2.0000 | INHALATION_SPRAY | Freq: Two times a day (BID) | RESPIRATORY_TRACT | 12 refills | Status: AC

## 2020-06-01 MED ORDER — DIVALPROEX SODIUM 250 MG PO DR TAB: 250.0000 mg | DELAYED_RELEASE_TABLET | Freq: Two times a day (BID) | ORAL | 0 refills | Status: DC

## 2020-06-01 MED ORDER — MELATONIN 10 MG PO TABS: 10.0000 mg | ORAL_TABLET | Freq: Every day | ORAL | 0 refills | Status: AC

## 2020-06-01 MED ORDER — GABAPENTIN 600 MG PO TABS: 600.0000 mg | ORAL_TABLET | Freq: Every day | ORAL | 0 refills | Status: DC

## 2020-06-01 MED ORDER — GABAPENTIN 600 MG PO TABS
600.0000 mg | ORAL_TABLET | Freq: Every day | ORAL | Status: DC
  Filled 2020-06-01: qty 1

## 2020-06-01 MED ORDER — DIVALPROEX SODIUM 250 MG PO DR TAB
250.0000 mg | DELAYED_RELEASE_TABLET | Freq: Two times a day (BID) | ORAL | Status: DC
  Filled 2020-06-01 (×2): qty 1

## 2020-06-01 NOTE — Progress Notes (Signed)
D: Pt A & O X 4. Denies SI, HI, AVH and pain at this time. D/C home as ordered. Picked up in lobby by her daughter. A: D/C instructions reviewed with pt including prescriptions and follow up appointments; compliance encouraged. All belongings from locker 19 returned to patient at time of departure. Scheduled medications administered with verbal education and effects monitored. Safety checks maintained without incident till time of d/c.  R: Pt receptive to care. Compliant with medications when offered. Denies adverse drug reactions when assessed. Verbalized understanding related to d/c instructions. Signed belonging sheet in agreement with items received from locker. Ambulatory with a steady gait. Appears to be in no physical distress at time of departure.

## 2020-06-01 NOTE — Progress Notes (Signed)
Virtual Visit via Video Note  I connected with Jasmine Werner on 06/02/20 at 11:00 AM EDT by a video enabled telemedicine application and verified that I am speaking with the correct person using two identifiers.   I discussed the limitations of evaluation and management by telemedicine and the availability of in person appointments. The patient expressed understanding and agreed to proceed.     I discussed the assessment and treatment plan with the patient. The patient was provided an opportunity to ask questions and all were answered. The patient agreed with the plan and demonstrated an understanding of the instructions.   The patient was advised to call back or seek an in-person evaluation if the symptoms worsen or if the condition fails to improve as anticipated.   Location: patient- home, provider- home office.  She agrees to proceed with interview with the present of her daughter.   I provided 18 minutes of non-face-to-face time during this encounter.   [name], MD    [facility] MD/PA/NP OP Progress Note  06/02/2020 11:33 AM Jasmine Werner  MRN:  [id number]  Chief Complaint:  Chief Complaint    Anxiety; Depression; Follow-up     HPI:  She was admitted to [facility] for SI since the last visit. According to the chart, she requested to taper off venlafaxine due to her brother mentioning to her that it had limited benefit, and oxcarbazepine (started during admission) to be discontinued.  - "She is requesting residential rehab. She states, "If you send me home this week, I will die." She reports SI to jump over a waterfall near her house or to overdose on cocaine. She has been visible and interacting in the milieu, mostly appearing euthymic but irritable at times. We discussed initiating Depakote, and patient agreeable to this medication change."  This is a follow-up appointment/aftercare visit since admission for SI in the context of cocaine use.  She states that she relapsed on cocaine.   She used several times since the last visit.  She had SI with plan to jump off from the dam. She recalled the conversation with this provider about emergency resources, and came in the hospital.  She states that she continues to have "mental anguish," which she says is "hard to explain." She woke up early in the morning today, and got through "moment to moment" by texting her friend, reading, and coming to her daughter's house.  Although she talked with her sponsor last night, she reports difficulty as her sponsor lives in [place]. She is willing to get outpatient therapy. She agrees to keep the appointment at [facility] for available resources. She hopes to go to church and do bible study  She sleeps better since starting gabapentin.  She has fatigue.  She has difficulty in concentration.  She denies SI.  She feels anxious and tense. She has diarrhea, diaphoresis and nausea. She agrees to be seen by PCP if those symptoms do not subside. She wants to switch venlafaxine to other antidepressant as she thinks it does not work for her.   Visit Diagnosis:    ICD-10-CM   1. MDD (major depressive disorder), recurrent episode, moderate (HCC)  F33.1 Valproic Acid level    Comprehensive Metabolic Panel (CMET)    Valproic Acid level    Comprehensive Metabolic Panel (CMET)  2. Cocaine abuse ([place])  F14.10     Past Psychiatric History: Please see initial evaluation for full details. I have reviewed the history. No updates at this time.     Past  Medical History:  Past Medical History:  Diagnosis Date   Alcohol use    Allergy    Anxiety    Asthma    Childhood schizophrenia ([place])    Chronic abdominal pain    Collagen vascular disease ([place])    COPD (chronic obstructive pulmonary disease) ([place])     Past Surgical History:  Procedure Laterality Date   ABDOMINAL HYSTERECTOMY     APPENDECTOMY      Family Psychiatric History: Please see initial evaluation for full details. I have reviewed the  history. No updates at this time.     Family History:  Family History  Problem Relation Age of Onset   Asthma Other    COPD Other    Alcoholism Other    Heart attack Other    Depression Other    Mental illness Other    Alcohol abuse Mother    Colon cancer Mother 20   Alcohol abuse Father    Depression Father     Social History:  Social History   Socioeconomic History   Marital status: Widowed    Spouse name: Not on file   Number of children: 5   Years of education: Not on file   Highest education level: Some college, no degree  Occupational History   Not on file  Tobacco Use   Smoking status: Current Every Day Smoker    Packs/day: 0.25    Types: Cigarettes   Smokeless tobacco: Never Used  Vaping Use   Vaping Use: Never used  Substance and Sexual Activity   Alcohol use: Not Currently   Drug use: Yes    Types: Marijuana, Cocaine   Sexual activity: Never  Other Topics Concern   Not on file  Social History Narrative   Not on file   Social Determinants of Health   Financial Resource Strain:    Difficulty of Paying Living Expenses:   Food Insecurity:    Worried About Charity fundraiser in the Last Year:    Arboriculturist in the Last Year:   Transportation Needs:    Film/video editor (Medical):    Lack of Transportation (Non-Medical):   Physical Activity:    Days of Exercise per Week:    Minutes of Exercise per Session:   Stress:    Feeling of Stress :   Social Connections:    Frequency of Communication with Friends and Family:    Frequency of Social Gatherings with Friends and Family:    Attends Religious Services:    Active Member of Clubs or Organizations:    Attends Archivist Meetings:    Marital Status:     Allergies:  Allergies  Allergen Reactions   Tramadol Nausea And Vomiting    Metabolic Disorder Labs: Lab Results  Component Value Date   HGBA1C 6.2 (H) 06/30/2019   MPG 131.24  06/30/2019   No results found for: PROLACTIN Lab Results  Component Value Date   CHOL 191 06/30/2019   TRIG 132 06/30/2019   HDL 32 (L) 06/30/2019   CHOLHDL 6.0 06/30/2019   VLDL 26 06/30/2019   LDLCALC 133 (H) 06/30/2019   Lab Results  Component Value Date   TSH 6.057 (H) 06/30/2019    Therapeutic Level Labs: No results found for: LITHIUM No results found for: VALPROATE No components found for:  CBMZ  Current Medications: Current Outpatient Medications  Medication Sig Dispense Refill   ASPIRIN 81 PO Take 1 tablet by mouth daily.      [  START ON 07/02/2020] divalproex (DEPAKOTE) 250 MG DR tablet Take 1 tablet (250 mg total) by mouth 2 (two) times daily. For mood stabilization 60 tablet 0   escitalopram (LEXAPRO) 10 MG tablet Start 5 mg daily for one week, then 10 mg daily 30 tablet 1   fluticasone (FLOVENT HFA) 44 MCG/ACT inhaler Inhale 2 puffs into the lungs 2 (two) times daily. For shortness of breath 1 Inhaler 12   [START ON 07/02/2020] gabapentin (NEURONTIN) 600 MG tablet Take 1 tablet (600 mg total) by mouth at bedtime. For agitation 30 tablet 0   ipratropium-albuterol (DUONEB) 0.5-2.5 (3) MG/3ML SOLN Inhale 3 mLs into the lungs every 6 (six) hours as needed (Shortness of Breath).  (Patient not taking: Reported on 05/25/2020)  2   lisinopril (ZESTRIL) 10 MG tablet Take 1 tablet (10 mg total) by mouth daily. 30 tablet 0   melatonin 10 MG TABS Take 10 mg by mouth at bedtime. For sleep 30 tablet 0   polyethylene glycol-electrolytes (TRILYTE) 420 g solution Take 4,000 mLs by mouth as directed. (Patient not taking: Reported on 04/25/2020) 4000 mL 0   rosuvastatin (CRESTOR) 5 MG tablet Take 5 mg by mouth daily.     VENTOLIN HFA 108 (90 BASE) MCG/ACT inhaler Inhale 1 puff into the lungs every 6 (six) hours as needed for shortness of breath.   2   No current facility-administered medications for this visit.     Musculoskeletal: Strength & Muscle Tone: N/A Gait & Station:  N/A Patient leans: N/A  Psychiatric Specialty Exam: Review of Systems  Psychiatric/Behavioral: Positive for decreased concentration and dysphoric mood. Negative for agitation, behavioral problems, confusion, hallucinations, self-injury, sleep disturbance and suicidal ideas. The patient is nervous/anxious. The patient is not hyperactive.   All other systems reviewed and are negative.   There were no vitals taken for this visit.There is no height or weight on file to calculate BMI.  General Appearance: Fairly Groomed  Eye Contact:  Good  Speech:  Clear and Coherent  Volume:  Normal  Mood:  Anxious and Depressed  Affect:  Appropriate, Congruent and down  Thought Process:  Coherent  Orientation:  Full (Time, Place, and Person)  Thought Content: Logical   Suicidal Thoughts:  No  Homicidal Thoughts:  No  Memory:  Immediate;   Good  Judgement:  Good  Insight:  Present  Psychomotor Activity:  Normal  Concentration:  Concentration: Good and Attention Span: Good  Recall:  Good  Fund of Knowledge: Good  Language: Good  Akathisia:  No  Handed:  Right  AIMS (if indicated): not done  Assets:  Communication Skills Desire for Improvement  ADL's:  Intact  Cognition: WNL  Sleep:  Poor   Screenings: AIMS     Admission (Discharged) from 05/26/2020 in [facility] Admission (Discharged) from 06/29/2019 in [facility]  AIMS Total Score 0 0    AUDIT     Admission (Discharged) from 05/26/2020 in [facility] Admission (Discharged) from 06/29/2019 in [facility]  Alcohol Use Disorder Identification Test Final Score (AUDIT) 9 0       Assessment and Plan:  Jasmine Werner is a 64 y.o. year old female with a history of  depression, cocaine use disorder,COPD,type II diabetes, who presents for follow up appointment for below.   1. MDD (major depressive disorder),  recurrent episode, moderate ([place]) She reports ongoing depressive symptoms and anxiety.  Will switch  from venlafaxine to Lexapro given she perceives limited benefit from this medication.  Discussed potential risk of serotonin syndrome and discontinuation symptoms.  Will continue gabapentin for anxiety and pain.  She is aware of its potential risk of dependence.  We will continue Depakote at this time for mood dysregulation; this medication may be tapered off in the future if improvement in her symptoms.  We will obtain labs for monitoring.    2. Cocaine abuse ([place]) She had a relapse in cocaine use since the last visit, which led to admission. She will greatly benefit from [place].  She has an upcoming appointment at [facility].   Plan 1. Decrease Venlafaxine 150 mg daily for one week, then 75 mg daily for one week, then discontinue 2. Start Lexapro 5 mg daily for one week, then 10 mg daily  3. Continue gabapentin 600 mg at night  4. Continue Depakote 250 mg twice a day 5. Next appointment- 8/6 at 8:40 for 30 mins, video 6. Obtain blood test in one week- Depakote level, LFT - She has an upcoming appointment at [facility]  - last EKG in 2020: QTc 484 msec - pending result of sleep study - Emergency resources which includes 911, ED, suicide crisis line [phone]) are discussed.   Past trials of medication:sertraline (memory loss),?bupropion (she felt "horns come out of my head),lithium, prolixin, haldol, Abilify (jaw movement), oxcarbazepine, lorazepam,clonazepam  The patient demonstrates the following risk factors for suicide: Chronic risk factors for suicide include:psychiatric disorder ofdepression. Acute risk factorsfor suicide include: unemployment. Protective factorsfor this patient include: responsibility to others (children, family), coping skills and hope for the future. Considering these factors, the overall suicide risk at this point appears to bemoderate, but not at imminent risk  to self. Patientisappropriate for outpatient follow up. She denies gun access at home.  [name], MD 06/02/2020, 11:33 AM

## 2020-06-01 NOTE — Progress Notes (Signed)
   05/31/20 2341  Psych Admission Type (Psych Patients Only)  Admission Status Voluntary  Psychosocial Assessment  Patient Complaints Other (Comment) (stomach issues that she relates to new medication)  Eye Contact Fair  Facial Expression Animated  Affect Appropriate to circumstance  Speech Logical/coherent  Interaction Assertive  Appearance/Hygiene Unremarkable  Behavior Characteristics Cooperative  Mood Anxious  Thought Process  Coherency WDL  Content WDL  Delusions WDL  Perception WDL  Hallucination None reported or observed  Judgment WDL  Confusion WDL  Danger to Self  Current suicidal ideation? Denies  Agreement Not to Harm Self Yes  Danger to Others  Danger to Others None reported or observed  Patient complained of some stomach issues during the day including nausea, vomiting, and diarrhea. She related it to the new medication "Depakote" given. Took Depakote later than scheduled. Wants in 12 hours apart.

## 2020-06-01 NOTE — Discharge Summary (Signed)
Physician Discharge Summary Note  Patient:  Jasmine Werner is an 64 y.o., female MRN:  [id number] DOB:  1956/10/07 Patient phone:  [phone] (home)  Patient address:   [address] [place] [address],  Total Time spent with patient: Greater than 30 minutes  Date of Admission:  05/26/2020 Date of Discharge: 06-01-20  Reason for Admission: Suicidal ideations & hallucinations.  Principal Problem: MDD (major depressive disorder), recurrent, severe, with psychosis ([place])  Discharge Diagnoses: Principal Problem:   MDD (major depressive disorder), recurrent, severe, with psychosis ([place])  Past Psychiatric History: Major depressive disorder.  Past Medical History:  Past Medical History:  Diagnosis Date   Alcohol use    Allergy    Anxiety    Asthma    Childhood schizophrenia ([place])    Chronic abdominal pain    Collagen vascular disease ([place])    COPD (chronic obstructive pulmonary disease) (HCC)     Past Surgical History:  Procedure Laterality Date   ABDOMINAL HYSTERECTOMY     APPENDECTOMY     Family History:  Family History  Problem Relation Age of Onset   Asthma Other    COPD Other    Alcoholism Other    Heart attack Other    Depression Other    Mental illness Other    Alcohol abuse Mother    Colon cancer Mother 87   Alcohol abuse Father    Depression Father    Family Psychiatric  History: See H&P  Social History:  Social History   Substance and Sexual Activity  Alcohol Use Not Currently     Social History   Substance and Sexual Activity  Drug Use Yes   Types: Marijuana, Cocaine    Social History   Socioeconomic History   Marital status: Widowed    Spouse name: Not on file   Number of children: 5   Years of education: Not on file   Highest education level: Some college, no degree  Occupational History   Not on file  Tobacco Use   Smoking status: Current Every Day Smoker    Packs/day: 0.25    Types: Cigarettes    Smokeless tobacco: Never Used  Vaping Use   Vaping Use: Never used  Substance and Sexual Activity   Alcohol use: Not Currently   Drug use: Yes    Types: Marijuana, Cocaine   Sexual activity: Never  Other Topics Concern   Not on file  Social History Narrative   Not on file   Social Determinants of Health   Financial Resource Strain:    Difficulty of Paying Living Expenses:   Food Insecurity:    Worried About Charity fundraiser in the Last Year:    Arboriculturist in the Last Year:   Transportation Needs:    Film/video editor (Medical):    Lack of Transportation (Non-Medical):   Physical Activity:    Days of Exercise per Week:    Minutes of Exercise per Session:   Stress:    Feeling of Stress :   Social Connections:    Frequency of Communication with Friends and Family:    Frequency of Social Gatherings with Friends and Family:    Attends Religious Services:    Active Member of Clubs or Organizations:    Attends Archivist Meetings:    Marital Status:    Hospital Course: (Per Md's admission SRA Notes): Patient is seen and examined. Patient is a 64 year old female with a past psychiatric history  significant for depression and cocaine dependence who presented to the [facility] emergency department on 05/25/2020 with suicidal ideation as well as hallucinations. The patient stated she was having worsening depressive symptoms. She stated that she had had 7 previous suicide attempts. This was while she was a teenager and in her early 35s. She had been seeing Dr. Modesta Messing for medication management for psychiatric issues. The patient had been seen in a televisit on 05/22/2020. At that time the patient described having "mental anguish". She was requesting benzodiazepines. This was declined by the primary psychiatrist. Her psychiatric medications on that visit included buspirone and venlafaxine extended release. Her dosage of the venlafaxine was  225 mg p.o. daily. The BuSpar was 5 mg p.o. 3 times daily. Trazodone 100 mg p.o. nightly was also continued. The patient stated that she had not been doing well after that visit, the decision was made to come to the emergency room for evaluation. On her last psychiatric admission which took place on 06/30/2019 I actually admitted the patient. Her discharge medications at that time included Abilify, lorazepam, Trileptal, Seroquel, venlafaxine and trazodone. The decision was made to admit her to the hospital for evaluation and stabilization. During the initial evaluation her primary concern was her shortness of breath and wheezing. The last chest x-ray that we had available to [place] was from August 2020. It was negative for acute pneumonia at that time. The decision was made to admit her to the hospital for evaluation and stabilization.  After the above admission evaluation, Jasmine Werner was recommended for mood stabilization treatments. The medication regimen for her presenting symptoms were discussed & initiated with her consent. She received, stabilized & was discharged on the medications as listed below on her discharge medication lists. She was enrolled & participated in the group counseling sessions being offered & held on this unit. She learned coping skills that should help her cope better after discharge. She presented other significant pre-existing medical issues that required treatment & or monitoring. She was resumed/discharged on all her pertinent home medications for those health issues. She tolerated her treatment regimen without any adverse effects or reactions reported. Jasmine Werner's symptoms responded well to her treatment regimen. This is evidenced by her daily reports of improved mood, symptoms & presentation of good affect. She presents mentally & medically stable for discharge today to continue mental health care on an outpatient basis as noted below.   Today upon this discharge evaluation with  attending psychiatrist, pt shares, "I'm feeling better" She denies any specific concerns. She is sleeping well. Her appetite is good. She denies any other physical complaints. She denies SI/HI/AH/VH. She is tolerating her medications well, and in agreement to continue her current treatment regimen without changes. She is in agreement to follow up on an outpatient basis as noted below. She was able to engage in safety planning including plan to return to [facility] or contact emergency services if he feels unable to maintain her own safety or the safety of others. Pt had no further questions, comments, or concerns. She left [facility] with all personal belongings in no apparent distress. Transportation per her daughter.  Physical Findings: AIMS: Facial and Oral Movements Muscles of Facial Expression: None, normal Lips and Perioral Area: None, normal Jaw: None, normal Tongue: None, normal,Extremity Movements Upper (arms, wrists, hands, fingers): None, normal Lower (legs, knees, ankles, toes): None, normal, Trunk Movements Neck, shoulders, hips: None, normal, Overall Severity Severity of abnormal movements (highest score from questions above): None, normal Incapacitation due to abnormal  movements: None, normal Patient's awareness of abnormal movements (rate only patient's report): No Awareness, Dental Status Current problems with teeth and/or dentures?: No Does patient usually wear dentures?: No  CIWA:  CIWA-Ar Total: 3 COWS:  COWS Total Score: 4  Musculoskeletal: Strength & Muscle Tone: within normal limits Gait & Station: normal Patient leans: N/A  Psychiatric Specialty Exam: Physical Exam Vitals and nursing note reviewed.  Constitutional:      Appearance: She is well-developed.  HENT:     Head: Normocephalic.     Nose: Nose normal.     Mouth/Throat:     Pharynx: Oropharynx is clear.  Eyes:     Pupils: Pupils are equal, round, and reactive to light.  Cardiovascular:     Rate and Rhythm: Normal  rate.  Pulmonary:     Effort: Pulmonary effort is normal.  Abdominal:     Palpations: Abdomen is soft.  Genitourinary:    Comments: Deferred Musculoskeletal:        General: Normal range of motion.     Cervical back: Normal range of motion.  Skin:    General: Skin is warm.  Neurological:     Mental Status: She is alert and oriented to person, place, and time.     Review of Systems  Constitutional: Negative for chills and fever.  HENT: Negative.   Eyes: Negative.   Respiratory: Negative for cough, shortness of breath and wheezing.   Cardiovascular: Negative for chest pain, palpitations and leg swelling.  Gastrointestinal: Negative for heartburn, nausea and vomiting.  Genitourinary: Negative.   Musculoskeletal: Negative.   Skin: Negative.   Neurological: Negative for dizziness, tremors, seizures and headaches.  Endo/Heme/Allergies:       Allergies: Tramadol  Psychiatric/Behavioral: Positive for depression (Stabilized with medication prior to discharge ) and substance abuse (Hx. Cocaine & THC use disorders). Negative for hallucinations, memory loss and suicidal ideas. The patient has insomnia (Stabilized with medication prior to discharge). The patient is not nervous/anxious (Stable).     Blood pressure 118/76, pulse (!) 113, temperature 98.7 F (37.1 C), temperature source Oral, resp. rate 16, height 5\' 5"  (1.651 m), weight 85.3 kg, SpO2 92 %.Body mass index is 31.29 kg/m.  See Md's discharge SRA    Has this patient used any form of tobacco in the last 30 days? (Cigarettes, Smokeless Tobacco, Cigars, and/or Pipes): N/A  Blood Alcohol level:  Lab Results  Component Value Date   ETH <10 05/25/2020   ETH <10 51/76/1607   Metabolic Disorder Labs:  Lab Results  Component Value Date   HGBA1C 6.2 (H) 06/30/2019   MPG 131.24 06/30/2019   No results found for: PROLACTIN Lab Results  Component Value Date   CHOL 191 06/30/2019   TRIG 132 06/30/2019   HDL 32 (L) 06/30/2019    CHOLHDL 6.0 06/30/2019   VLDL 26 06/30/2019   LDLCALC 133 (H) 06/30/2019   See Psychiatric Specialty Exam and Suicide Risk Assessment completed by Attending Physician prior to discharge.  Discharge destination:  Home  Is patient on multiple antipsychotic therapies at discharge: No  Do you recommend tapering to monotherapy for antipsychotics?  NA   Has Patient had three or more failed trials of antipsychotic monotherapy by history: NA  Recommended Plan for Multiple Antipsychotic Therapies: NA  Allergies as of 06/01/2020      Reactions   Tramadol Nausea And Vomiting      Medication List    STOP taking these medications   budesonide-formoterol 160-4.5 MCG/ACT inhaler Commonly known  as: SYMBICORT   busPIRone 5 MG tablet Commonly known as: BUSPAR   traZODone 100 MG tablet Commonly known as: DESYREL   varenicline 0.5 MG X 11 & 1 MG X 42 tablet Commonly known as: CHANTIX PAK     TAKE these medications     Indication  ASPIRIN 81 PO Take 1 tablet by mouth daily.  Indication: Heart health   divalproex 250 MG DR tablet Commonly known as: DEPAKOTE Take 1 tablet (250 mg total) by mouth 2 (two) times daily. For mood stabilization  Indication: Mood stabilization   fluticasone 44 MCG/ACT inhaler Commonly known as: FLOVENT HFA Inhale 2 puffs into the lungs 2 (two) times daily. For shortness of breath  Indication: Chronic Obstructive Lung Disease   gabapentin 600 MG tablet Commonly known as: NEURONTIN Take 1 tablet (600 mg total) by mouth at bedtime. For agitation  Indication: Agitation   ipratropium-albuterol 0.5-2.5 (3) MG/3ML Soln Commonly known as: DUONEB Inhale 3 mLs into the lungs every 6 (six) hours as needed (Shortness of Breath).  Indication: Chronic Obstructive Lung Disease   lisinopril 10 MG tablet Commonly known as: ZESTRIL Take 1 tablet (10 mg total) by mouth daily.  Indication: High Blood Pressure Disorder   Melatonin 10 MG Tabs Take 10 mg by mouth at  bedtime. For sleep What changed:   medication strength  when to take this  additional instructions  Indication: Trouble Sleeping   polyethylene glycol-electrolytes 420 g solution Commonly known as: TriLyte Take 4,000 mLs by mouth as directed.  Indication: Evacuation of Material from the Bowel, Constipation   rosuvastatin 5 MG tablet Commonly known as: CRESTOR Take 5 mg by mouth daily.  Indication: Inherited Heterozygous Hypercholesterolemia, High Amount of Fats in the Blood   venlafaxine XR 150 MG 24 hr capsule Commonly known as: EFFEXOR-XR Take 1 capsule (150 mg total) by mouth daily with breakfast. For depression Start taking on: June 02, 2020 What changed:   medication strength  how much to take  when to take this  additional instructions  Indication: Major Depressive Disorder   Ventolin HFA 108 (90 Base) MCG/ACT inhaler Generic drug: albuterol Inhale 1 puff into the lungs every 6 (six) hours as needed for shortness of breath.  Indication: Chronic Obstructive Lung Disease       Follow-up Information    [facility] Follow up.   Contact information: [address] [place] [address] [phone]        [facility] Follow up on 06/01/2020.   Specialty: Behavioral Health Why: You have an appointment on 06/02/20 at 4:00 pm for therapy with Maye Hides.  This will be a virtual appointment.  You also have an appointment on 06/20/20 at 9:00 am with Dr. Modesta Messing for medication management.  This will also be a virtual appointment. Contact information: [address] [place] [phone]       [facility]. Go on 06/06/2020.   Why: You  have an appointment on 06/06/20 at 9:30 a for SAIOP (substance abuse intensive outpatient services).  This appointment will be held in person. Be sure to bring any discharge paperwork.  Contact information: [place] [address] [phone]              Follow-up recommendations: Activity:  As tolerated Diet: As recommended by your primary care doctor. Keep all scheduled follow-up appointments as recommended.    Comments: Patient is instructed prior  to discharge to: Take all medications as prescribed by his/her mental healthcare provider. Report any adverse effects and or reactions from the medicines to his/her outpatient provider promptly. Patient has been instructed & cautioned: To not engage in alcohol and or illegal drug use while on prescription medicines. In the event of worsening symptoms, patient is instructed to call the crisis hotline, 911 and or go to the nearest ED for appropriate evaluation and treatment of symptoms. To follow-up with his/her primary care provider for your other medical issues, concerns and or health care needs.   Signed: Lindell Spar, NP, PMHNP, FNP-BC 06/01/2020, 10:02 AM

## 2020-06-01 NOTE — BHH Suicide Risk Assessment (Signed)
[facility] Discharge Suicide Risk Assessment   Principal Problem: MDD (major depressive disorder), recurrent, severe, with psychosis ([place]) Discharge Diagnoses: Principal Problem:   MDD (major depressive disorder), recurrent, severe, with psychosis ([place])   Total Time spent with patient: 20 minutes  Musculoskeletal: Strength & Muscle Tone: within normal limits Gait & Station: normal Patient leans: N/A  Psychiatric Specialty Exam: Review of Systems  All other systems reviewed and are negative.   Blood pressure 118/76, pulse (!) 113, temperature 98.7 F (37.1 C), temperature source Oral, resp. rate 16, height 5\' 5"  (1.651 m), weight 85.3 kg, SpO2 92 %.Body mass index is 31.29 kg/m.  General Appearance: Casual  Eye Contact::  Good  Speech:  Normal Rate409  Volume:  Normal  Mood:  Euthymic  Affect:  Congruent  Thought Process:  Coherent and Descriptions of Associations: Intact  Orientation:  Full (Time, Place, and Person)  Thought Content:  Logical  Suicidal Thoughts:  No  Homicidal Thoughts:  No  Memory:  Immediate;   Good Recent;   Good Remote;   Good  Judgement:  Intact  Insight:  Fair  Psychomotor Activity:  Normal  Concentration:  Good  Recall:  Good  Fund of Knowledge:Good  Language: Good  Akathisia:  Negative  Handed:  Right  AIMS (if indicated):     Assets:  Desire for Improvement Housing Resilience Social Support  Sleep:  Number of Hours: 3.75  Cognition: WNL  ADL's:  Intact   Mental Status Per Nursing Assessment::   On Admission:  Suicidal ideation indicated by patient  Demographic Factors:  Divorced or widowed, Caucasian, Low socioeconomic status, Living alone and Unemployed  Loss Factors: Financial problems/change in socioeconomic status  Historical Factors: Impulsivity  Risk Reduction Factors:   Positive social support  Continued Clinical Symptoms:  Bipolar Disorder:   Mixed State Alcohol/Substance Abuse/Dependencies  Cognitive Features That  Contribute To Risk:  None    Suicide Risk:  Minimal: No identifiable suicidal ideation.  Patients presenting with no risk factors but with morbid ruminations; may be classified as minimal risk based on the severity of the depressive symptoms   Follow-up Information    [facility] Follow up.   Contact information: [address] [place] [address] [phone]        [facility] Follow up on 06/01/2020.   Specialty: Behavioral Health Why: You have an appointment on 06/01/20 at 4:00 pm for therapy with Maye Hides.  This will be a virtual appointment.  You also have an appointment on 06/20/20 at 9:00 am with Dr. Modesta Messing for medication management.  This will also be a virtual appointment. Contact information: [address] [place] [phone]       [facility]. Go on 06/06/2020.   Why: You  have an appointment on 06/06/20 at 9:30 am.  This appointment will be held in person. Contact information: [place] [address] [phone]        [facility] Follow up.   Contact information: [place], [place] [address]   P: [phone] F:                Plan Of Care/Follow-up recommendations:  Activity:  ad lib Other:  have doctors check depakote level, liver function enzymes and CBC when you follow up  Sharma Covert, MD 06/01/2020, 9:37 AM

## 2020-06-01 NOTE — Progress Notes (Signed)
  [facility] Adult Case Management Discharge Plan :  Will you be returning to the same living situation after discharge:  Yes,  patient is returning home At discharge, do you have transportation home?: Yes,  patient's daughter is picking up Do you have the ability to pay for your medications: Yes,  Medicare  Release of information consent forms completed and in the chart;  Patient's signature needed at discharge.  Patient to Follow up at:  Follow-up Information    [facility] Follow up.   Why: You do not meet criteria for ACTT services at this time. For any additional questions or concerns, please contact this agency.  Contact information: [address] [place] [address] [phone]        [facility] Follow up on 06/01/2020.   Specialty: Behavioral Health Why: You have an appointment on 06/02/20 at 11:00am for therapy with Maye Hides.  This will be a virtual appointment.  You also have an appointment on 06/20/20 at 9:00 am with Dr. Modesta Messing for medication management.  This will also be a virtual appointment. Contact information: [address] [place] [phone]       [facility]. Go on 06/06/2020.   Why: You  have an appointment on 06/06/20 at 9:30 a for SAIOP (substance abuse intensive outpatient services).  This appointment will be held in person. Be sure to bring any discharge paperwork. Please call agency if you have any additional questions or concern Contact information: [address] [place] [address] [phone]               Next level of care provider has access to [place] and Suicide Prevention discussed: Yes,  with the patient  Have you used any form of tobacco in the last 30 days? (Cigarettes, Smokeless Tobacco, Cigars, and/or Pipes): Yes  Has patient been referred to the Quitline?: Patient refused  referral  Patient has been referred for addiction treatment: Yes  Marylee Floras, [place] 06/01/2020, 10:51 AM

## 2020-06-01 NOTE — Progress Notes (Signed)
Pt is being intrusive with Probation officer and trying to Engineer, building services to open dayroom when they came out. Writer told pt on two occassions when the dayroom opened. Writer left nurses station and when they returned, Probation officer observed pt reading the census. Writer addressed pt and told pt not to retrieve any documents from the nurses station and that documents contain other pt's personal information. Writer has also had to tell pt on more than one occasion not to hand out and lean on the nurses station counter.

## 2020-06-02 ENCOUNTER — Other Ambulatory Visit: Payer: Self-pay

## 2020-06-02 ENCOUNTER — Telehealth (INDEPENDENT_AMBULATORY_CARE_PROVIDER_SITE_OTHER): Payer: Medicare Other | Admitting: Psychiatry

## 2020-06-02 ENCOUNTER — Encounter (HOSPITAL_COMMUNITY): Payer: Self-pay | Admitting: Psychiatry

## 2020-06-02 DIAGNOSIS — F1410 Cocaine abuse, uncomplicated: Secondary | ICD-10-CM | POA: Diagnosis not present

## 2020-06-02 DIAGNOSIS — F331 Major depressive disorder, recurrent, moderate: Secondary | ICD-10-CM

## 2020-06-02 MED ORDER — GABAPENTIN 600 MG PO TABS: 600.0000 mg | ORAL_TABLET | Freq: Every day | ORAL | 0 refills | Status: AC

## 2020-06-02 MED ORDER — DIVALPROEX SODIUM 250 MG PO DR TAB: 250.0000 mg | DELAYED_RELEASE_TABLET | Freq: Two times a day (BID) | ORAL | 0 refills | Status: AC

## 2020-06-02 MED ORDER — ESCITALOPRAM OXALATE 10 MG PO TABS: ORAL_TABLET | ORAL | 1 refills | Status: AC

## 2020-06-02 NOTE — Progress Notes (Signed)
Spiritual care group on grief and loss facilitated by chaplain Jerene Pitch MDiv, [facility]  Group Goal:  Support / Education around grief and loss Members engage in facilitated group support and psycho-social education.  Group Description:  Following introductions and group rules, group members engaged in facilitated group dialog and support around topic of loss, with particular support around experiences of loss in their lives. Group Identified types of loss (relationships / self / things) and identified patterns, circumstances, and changes that precipitate losses. Reflected on thoughts / feelings around loss, normalized grief responses, and recognized variety in grief experience.   Group noted Worden's four tasks of grief in discussion.  Group drew on Adlerian / Rogerian, narrative, MI, Patient Progress:

## 2020-06-02 NOTE — Patient Instructions (Addendum)
1. Decrease Venlafaxine 150 mg daily for one week, then 75 mg daily for one week, then discontinue 2. Start lexapro 5 mg daily for one week, then 10 mg daily  3. Continue gabapentin 600 mg at night  4. Continue depakote 250 mg twice a day 5. Next appointment- 8/6 at 8:40 , video 6. Obtain blood test in one week- depakote level, LFT

## 2020-06-14 DIAGNOSIS — Z01818 Encounter for other preprocedural examination: Secondary | ICD-10-CM | POA: Diagnosis not present

## 2020-06-16 DIAGNOSIS — I10 Essential (primary) hypertension: Secondary | ICD-10-CM | POA: Diagnosis not present

## 2020-06-16 DIAGNOSIS — Z7982 Long term (current) use of aspirin: Secondary | ICD-10-CM | POA: Diagnosis not present

## 2020-06-16 DIAGNOSIS — Z87891 Personal history of nicotine dependence: Secondary | ICD-10-CM | POA: Diagnosis not present

## 2020-06-16 DIAGNOSIS — K633 Ulcer of intestine: Secondary | ICD-10-CM | POA: Diagnosis not present

## 2020-06-16 DIAGNOSIS — K529 Noninfective gastroenteritis and colitis, unspecified: Secondary | ICD-10-CM | POA: Diagnosis not present

## 2020-06-16 DIAGNOSIS — Z800 Family history of malignant neoplasm of digestive organs: Secondary | ICD-10-CM | POA: Diagnosis not present

## 2020-06-16 DIAGNOSIS — J449 Chronic obstructive pulmonary disease, unspecified: Secondary | ICD-10-CM | POA: Diagnosis not present

## 2020-06-16 DIAGNOSIS — Z79899 Other long term (current) drug therapy: Secondary | ICD-10-CM | POA: Diagnosis not present

## 2020-06-16 DIAGNOSIS — Z7951 Long term (current) use of inhaled steroids: Secondary | ICD-10-CM | POA: Diagnosis not present

## 2020-06-16 DIAGNOSIS — Z1211 Encounter for screening for malignant neoplasm of colon: Secondary | ICD-10-CM | POA: Diagnosis not present

## 2020-06-20 ENCOUNTER — Telehealth (HOSPITAL_COMMUNITY): Payer: Medicare Other | Admitting: Psychiatry

## 2020-06-27 DIAGNOSIS — K633 Ulcer of intestine: Secondary | ICD-10-CM | POA: Diagnosis not present

## 2020-06-27 DIAGNOSIS — Z800 Family history of malignant neoplasm of digestive organs: Secondary | ICD-10-CM | POA: Diagnosis not present

## 2020-06-30 NOTE — Progress Notes (Deleted)
BH MD/PA/NP OP Progress Note  06/30/2020 11:49 AM Jasmine Werner  MRN:  [id number]  Chief Complaint:  HPI: *** Visit Diagnosis: No diagnosis found.  Past Psychiatric History: Please see initial evaluation for full details. I have reviewed the history. No updates at this time.     Past Medical History:  Past Medical History:  Diagnosis Date  . Alcohol use   . Allergy   . Anxiety   . Asthma   . Childhood schizophrenia ([place])   . Chronic abdominal pain   . Collagen vascular disease ([place])   . COPD (chronic obstructive pulmonary disease) ([place])     Past Surgical History:  Procedure Laterality Date  . ABDOMINAL HYSTERECTOMY    . APPENDECTOMY      Family Psychiatric History: Please see initial evaluation for full details. I have reviewed the history. No updates at this time.     Family History:  Family History  Problem Relation Age of Onset  . Asthma Other   . COPD Other   . Alcoholism Other   . Heart attack Other   . Depression Other   . Mental illness Other   . Alcohol abuse Mother   . Colon cancer Mother 20  . Alcohol abuse Father   . Depression Father     Social History:  Social History   Socioeconomic History  . Marital status: Widowed    Spouse name: Not on file  . Number of children: 5  . Years of education: Not on file  . Highest education level: Some college, no degree  Occupational History  . Not on file  Tobacco Use  . Smoking status: Current Every Day Smoker    Packs/day: 0.25    Types: Cigarettes  . Smokeless tobacco: Never Used  Vaping Use  . Vaping Use: Never used  Substance and Sexual Activity  . Alcohol use: Not Currently  . Drug use: Yes    Types: Marijuana, Cocaine  . Sexual activity: Never  Other Topics Concern  . Not on file  Social History Narrative  . Not on file   Social Determinants of Health   Financial Resource Strain:   . Difficulty of Paying Living Expenses:   Food Insecurity:   . Worried About Charity fundraiser  in the Last Year:   . Arboriculturist in the Last Year:   Transportation Needs:   . Film/video editor (Medical):   Marland Kitchen Lack of Transportation (Non-Medical):   Physical Activity:   . Days of Exercise per Week:   . Minutes of Exercise per Session:   Stress:   . Feeling of Stress :   Social Connections:   . Frequency of Communication with Friends and Family:   . Frequency of Social Gatherings with Friends and Family:   . Attends Religious Services:   . Active Member of Clubs or Organizations:   . Attends Archivist Meetings:   Marland Kitchen Marital Status:     Allergies:  Allergies  Allergen Reactions  . Tramadol Nausea And Vomiting    Metabolic Disorder Labs: Lab Results  Component Value Date   HGBA1C 6.2 (H) 06/30/2019   MPG 131.24 06/30/2019   No results found for: PROLACTIN Lab Results  Component Value Date   CHOL 191 06/30/2019   TRIG 132 06/30/2019   HDL 32 (L) 06/30/2019   CHOLHDL 6.0 06/30/2019   VLDL 26 06/30/2019   LDLCALC 133 (H) 06/30/2019   Lab Results  Component Value Date  TSH 6.057 (H) 06/30/2019    Therapeutic Level Labs: No results found for: LITHIUM No results found for: VALPROATE No components found for:  CBMZ  Current Medications: Current Outpatient Medications  Medication Sig Dispense Refill  . ASPIRIN 81 PO Take 1 tablet by mouth daily.     Derrill Memo ON 07/02/2020] divalproex (DEPAKOTE) 250 MG DR tablet Take 1 tablet (250 mg total) by mouth 2 (two) times daily. For mood stabilization 60 tablet 0  . escitalopram (LEXAPRO) 10 MG tablet Start 5 mg daily for one week, then 10 mg daily 30 tablet 1  . fluticasone (FLOVENT HFA) 44 MCG/ACT inhaler Inhale 2 puffs into the lungs 2 (two) times daily. For shortness of breath 1 Inhaler 12  . [START ON 07/02/2020] gabapentin (NEURONTIN) 600 MG tablet Take 1 tablet (600 mg total) by mouth at bedtime. For agitation 30 tablet 0  . ipratropium-albuterol (DUONEB) 0.5-2.5 (3) MG/3ML SOLN Inhale 3 mLs into the  lungs every 6 (six) hours as needed (Shortness of Breath).  (Patient not taking: Reported on 05/25/2020)  2  . lisinopril (ZESTRIL) 10 MG tablet Take 1 tablet (10 mg total) by mouth daily. 30 tablet 0  . melatonin 10 MG TABS Take 10 mg by mouth at bedtime. For sleep 30 tablet 0  . polyethylene glycol-electrolytes (TRILYTE) 420 g solution Take 4,000 mLs by mouth as directed. (Patient not taking: Reported on 04/25/2020) 4000 mL 0  . rosuvastatin (CRESTOR) 5 MG tablet Take 5 mg by mouth daily.    . VENTOLIN HFA 108 (90 BASE) MCG/ACT inhaler Inhale 1 puff into the lungs every 6 (six) hours as needed for shortness of breath.   2   No current facility-administered medications for this visit.     Musculoskeletal: Strength & Muscle Tone: N/A Gait & Station: N/A Patient leans: N/A  Psychiatric Specialty Exam: Review of Systems  There were no vitals taken for this visit.There is no height or weight on file to calculate BMI.  General Appearance: {Appearance:22683}  Eye Contact:  {BHH EYE CONTACT:22684}  Speech:  Clear and Coherent  Volume:  Normal  Mood:  {BHH MOOD:22306}  Affect:  {Affect (PAA):22687}  Thought Process:  Coherent  Orientation:  Full (Time, Place, and Person)  Thought Content: Logical   Suicidal Thoughts:  {ST/HT (PAA):22692}  Homicidal Thoughts:  {ST/HT (PAA):22692}  Memory:  Immediate;   Good  Judgement:  {Judgement (PAA):22694}  Insight:  {Insight (PAA):22695}  Psychomotor Activity:  Normal  Concentration:  Concentration: Good and Attention Span: Good  Recall:  Good  Fund of Knowledge: Good  Language: Good  Akathisia:  No  Handed:  Right  AIMS (if indicated): not done  Assets:  Communication Skills Desire for Improvement  ADL's:  Intact  Cognition: WNL  Sleep:  {BHH GOOD/FAIR/POOR:22877}   Screenings: AIMS     Admission (Discharged) from 05/26/2020 in [facility] Admission (Discharged) from 06/29/2019 in [facility]  AIMS Total Score 0 0    AUDIT     Admission (Discharged) from 05/26/2020 in [facility] Admission (Discharged) from 06/29/2019 in [facility]  Alcohol Use Disorder Identification Test Final Score (AUDIT) 9 0       Assessment and Plan:  Jasmine Werner is a 64 y.o. year old female with a history of depression, cocaine use disorder,COPD,type II diabetes, who presents for follow up appointment for below.    1. MDD (major depressive  disorder), recurrent episode, moderate ([place]) She reports ongoing depressive symptoms and anxiety.  Will switch from venlafaxine to Lexapro given she perceives limited benefit from this medication.  Discussed potential risk of serotonin syndrome and discontinuation symptoms.  Will continue gabapentin for anxiety and pain.  She is aware of its potential risk of dependence.  We will continue Depakote at this time for mood dysregulation; this medication may be tapered off in the future if improvement in her symptoms.  We will obtain labs for monitoring.    2. Cocaine abuse ([place]) She had a relapse in cocaine use since the last visit, which led to admission. She will greatly benefit from [place].  She has an upcoming appointment at [facility]   Plan 1. Decrease Venlafaxine 150 mg daily for one week, then 75 mg daily for one week, then discontinue 2. Start Lexapro 5 mg daily for one week, then 10 mg daily  3. Continue gabapentin 600 mg at night  4. Continue Depakote 250 mg twice a day 5. Next appointment- 8/6 at 8:40 for 30 mins, video 6. Obtain blood test in one week- Depakote level, LFT - She has an upcoming appointment at [facility]  - last EKG in 2020: QTc 484 msec - pending result of sleep study - Emergency resources which includes 911, ED, suicide crisis line [phone]) are discussed.   Past trials of medication:sertraline (memory loss),?bupropion (she felt "horns come  out of my head),lithium, prolixin, haldol, Abilify (jaw movement), oxcarbazepine,lorazepam,clonazepam  The patient demonstrates the following risk factors for suicide: Chronic risk factors for suicide include:psychiatric disorder ofdepression. Acute risk factorsfor suicide include: unemployment. Protective factorsfor this patient include: responsibility to others (children, family), coping skills and hope for the future. Considering these factors, the overall suicide risk at this point appears to bemoderate, but not at imminent risk to self. Patientisappropriate for outpatient follow up. She denies gun access at home.   [name], MD 06/30/2020, 11:49 AM

## 2020-07-01 DIAGNOSIS — F29 Unspecified psychosis not due to a substance or known physiological condition: Secondary | ICD-10-CM | POA: Diagnosis not present

## 2020-07-01 DIAGNOSIS — F332 Major depressive disorder, recurrent severe without psychotic features: Secondary | ICD-10-CM | POA: Diagnosis not present

## 2020-07-01 DIAGNOSIS — Z6832 Body mass index (BMI) 32.0-32.9, adult: Secondary | ICD-10-CM | POA: Diagnosis not present

## 2020-07-07 ENCOUNTER — Telehealth (HOSPITAL_COMMUNITY): Payer: Medicare Other | Admitting: Psychiatry

## 2020-07-20 DIAGNOSIS — I10 Essential (primary) hypertension: Secondary | ICD-10-CM | POA: Diagnosis not present

## 2020-07-20 DIAGNOSIS — E1165 Type 2 diabetes mellitus with hyperglycemia: Secondary | ICD-10-CM | POA: Diagnosis not present

## 2020-07-20 DIAGNOSIS — E782 Mixed hyperlipidemia: Secondary | ICD-10-CM | POA: Diagnosis not present

## 2020-07-24 DIAGNOSIS — F331 Major depressive disorder, recurrent, moderate: Secondary | ICD-10-CM | POA: Diagnosis not present

## 2020-07-24 DIAGNOSIS — E782 Mixed hyperlipidemia: Secondary | ICD-10-CM | POA: Diagnosis not present

## 2020-07-24 DIAGNOSIS — J449 Chronic obstructive pulmonary disease, unspecified: Secondary | ICD-10-CM | POA: Diagnosis not present

## 2020-07-24 DIAGNOSIS — E1165 Type 2 diabetes mellitus with hyperglycemia: Secondary | ICD-10-CM | POA: Diagnosis not present

## 2020-07-24 DIAGNOSIS — F17210 Nicotine dependence, cigarettes, uncomplicated: Secondary | ICD-10-CM | POA: Diagnosis not present

## 2020-07-24 DIAGNOSIS — Z23 Encounter for immunization: Secondary | ICD-10-CM | POA: Diagnosis not present

## 2020-08-08 DIAGNOSIS — K633 Ulcer of intestine: Secondary | ICD-10-CM | POA: Diagnosis not present

## 2020-08-08 DIAGNOSIS — R140 Abdominal distension (gaseous): Secondary | ICD-10-CM | POA: Diagnosis not present

## 2020-08-08 DIAGNOSIS — R197 Diarrhea, unspecified: Secondary | ICD-10-CM | POA: Diagnosis not present

## 2020-08-08 DIAGNOSIS — Z800 Family history of malignant neoplasm of digestive organs: Secondary | ICD-10-CM | POA: Diagnosis not present

## 2020-08-09 DIAGNOSIS — K633 Ulcer of intestine: Secondary | ICD-10-CM | POA: Diagnosis not present

## 2020-08-09 DIAGNOSIS — R197 Diarrhea, unspecified: Secondary | ICD-10-CM | POA: Diagnosis not present

## 2020-08-16 DIAGNOSIS — S99922A Unspecified injury of left foot, initial encounter: Secondary | ICD-10-CM | POA: Diagnosis not present

## 2020-08-16 DIAGNOSIS — M79675 Pain in left toe(s): Secondary | ICD-10-CM | POA: Diagnosis not present

## 2020-08-16 DIAGNOSIS — M19072 Primary osteoarthritis, left ankle and foot: Secondary | ICD-10-CM | POA: Diagnosis not present

## 2020-08-29 DIAGNOSIS — K5730 Diverticulosis of large intestine without perforation or abscess without bleeding: Secondary | ICD-10-CM | POA: Diagnosis not present

## 2020-09-20 DIAGNOSIS — Z23 Encounter for immunization: Secondary | ICD-10-CM | POA: Diagnosis not present

## 2020-10-18 IMAGING — DX DG CHEST 2V
2 series · 2 of 2 positions shown · non-contrast
Comparison: Radiograph 07/05/2019

CLINICAL DATA: Shortness of breath and chest tightness for 1 week

EXAM:
CHEST - 2 VIEW

[chest pa]
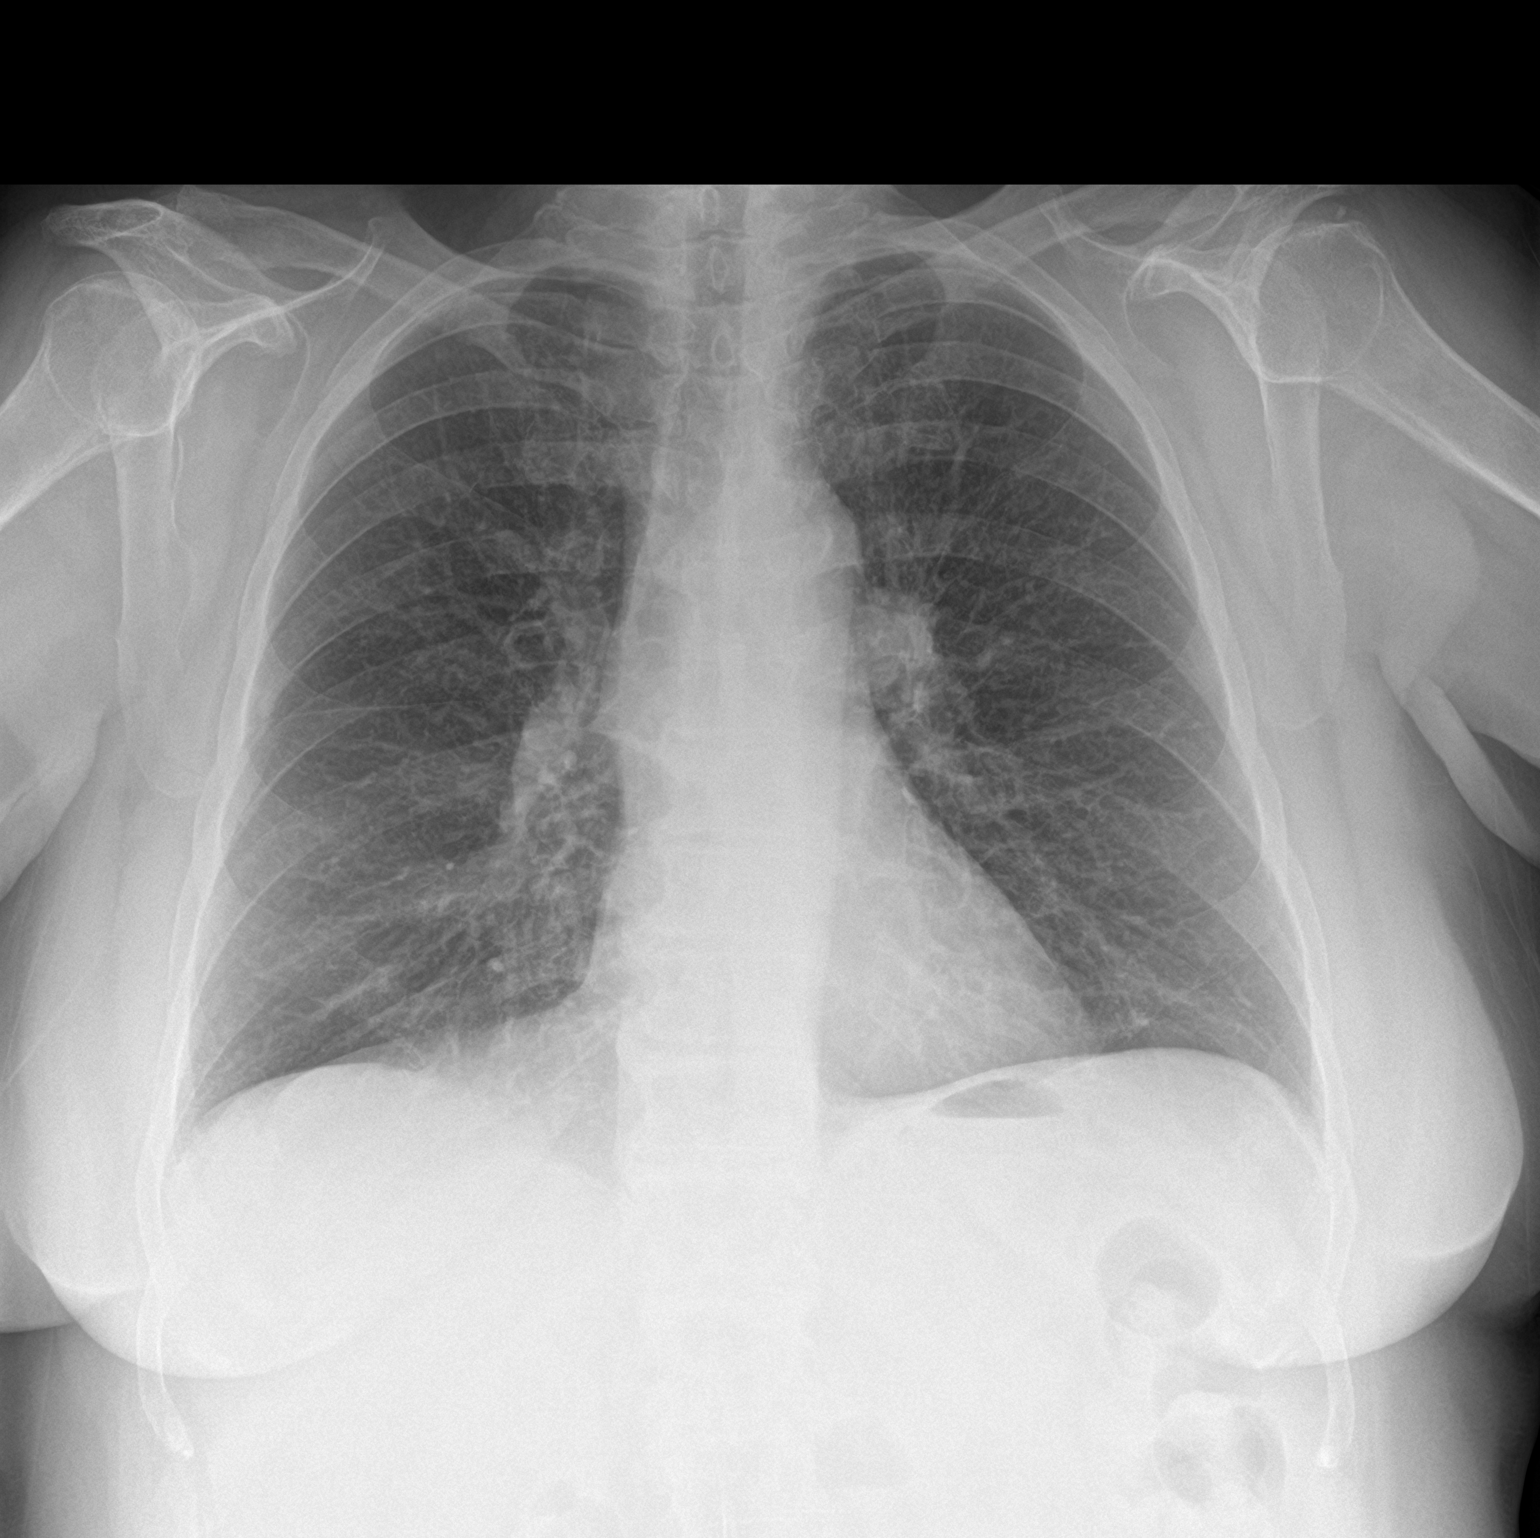

[chest lat]
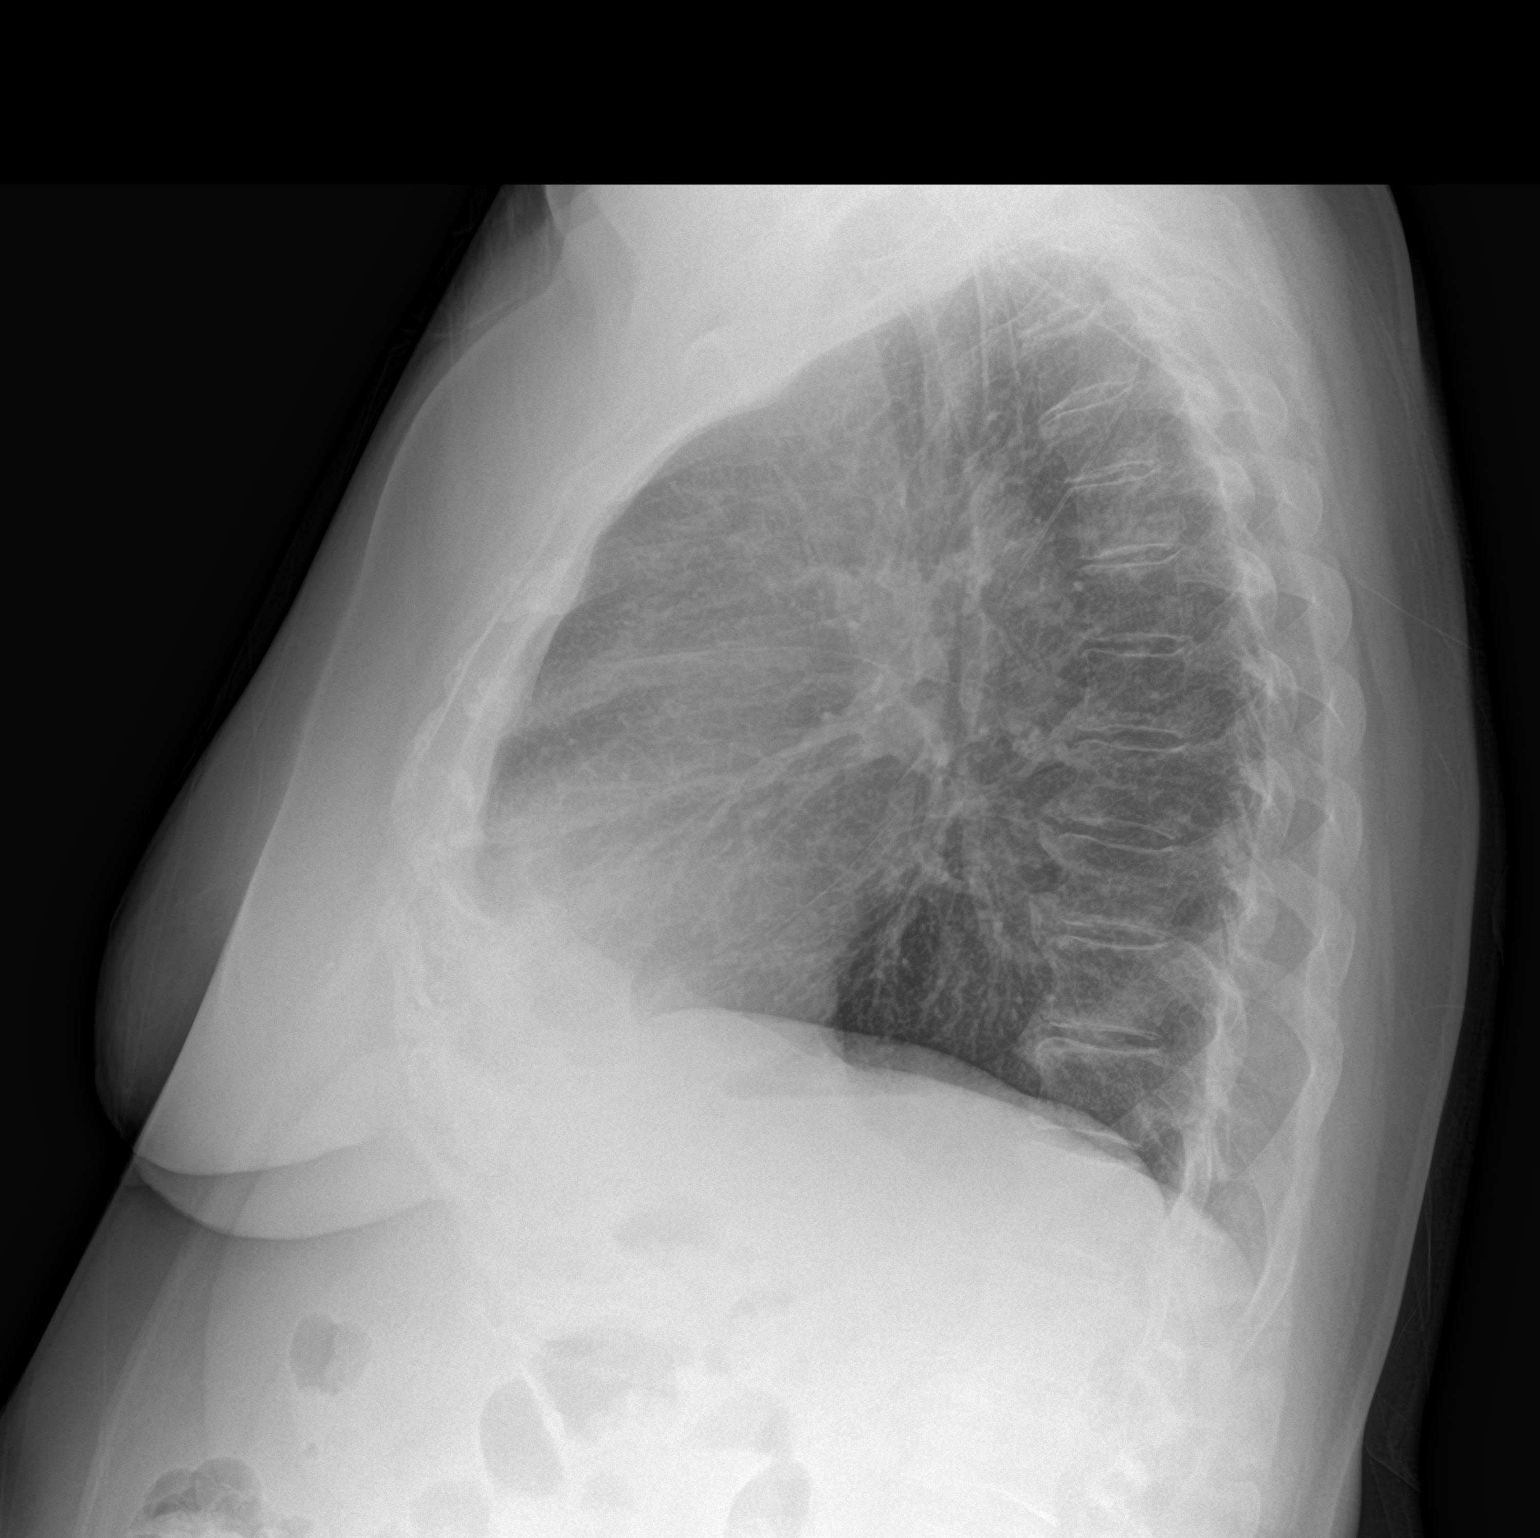

[2 of 2 positions shown; findings below may reference images not displayed]

FINDINGS: Chronic hyperinflation with flattening of the diaphragms and
coarsened interstitial and bronchitic changes. No consolidation,
features of edema, pneumothorax, or effusion. Pulmonary vascularity
is normally distributed. The cardiomediastinal contours are
unremarkable. No acute osseous or soft tissue abnormality.
Degenerative changes are present in the imaged spine and shoulders.
IMPRESSION: No acute cardiopulmonary abnormality.

Chronic hyperinflation, coarsened interstitial and bronchitic
features. Could reflect smoking related changes.

Aortic Atherosclerosis ([id number]).

## 2020-11-21 DIAGNOSIS — E782 Mixed hyperlipidemia: Secondary | ICD-10-CM | POA: Diagnosis not present

## 2020-11-21 DIAGNOSIS — E1165 Type 2 diabetes mellitus with hyperglycemia: Secondary | ICD-10-CM | POA: Diagnosis not present

## 2020-11-21 DIAGNOSIS — J441 Chronic obstructive pulmonary disease with (acute) exacerbation: Secondary | ICD-10-CM | POA: Diagnosis not present

## 2020-11-21 DIAGNOSIS — I10 Essential (primary) hypertension: Secondary | ICD-10-CM | POA: Diagnosis not present

## 2020-11-21 DIAGNOSIS — E8881 Metabolic syndrome: Secondary | ICD-10-CM | POA: Diagnosis not present

## 2020-11-21 DIAGNOSIS — Z1329 Encounter for screening for other suspected endocrine disorder: Secondary | ICD-10-CM | POA: Diagnosis not present

## 2021-01-10 ENCOUNTER — Emergency Department (HOSPITAL_COMMUNITY): Payer: Medicare HMO

## 2021-01-10 ENCOUNTER — Emergency Department (HOSPITAL_COMMUNITY)
Admission: EM | Admit: 2021-01-10 | Discharge: 2021-01-11 | Disposition: A | Discharge status: Other Acute Inpatient Hospital | Payer: Medicare HMO | Attending: Emergency Medicine | Admitting: Emergency Medicine

## 2021-01-10 ENCOUNTER — Ambulatory Visit (INDEPENDENT_AMBULATORY_CARE_PROVIDER_SITE_OTHER)
Admission: EM | Admit: 2021-01-10 | Discharge: 2021-01-10 | Disposition: A | Discharge status: Home / Self Care | Payer: Medicare HMO | Source: Home / Self Care | Attending: Psychiatry | Admitting: Psychiatry

## 2021-01-10 ENCOUNTER — Other Ambulatory Visit: Payer: Self-pay

## 2021-01-10 ENCOUNTER — Encounter (HOSPITAL_COMMUNITY): Payer: Self-pay

## 2021-01-10 DIAGNOSIS — Z20822 Contact with and (suspected) exposure to covid-19: Secondary | ICD-10-CM | POA: Insufficient documentation

## 2021-01-10 DIAGNOSIS — Z9119 Patient's noncompliance with other medical treatment and regimen: Secondary | ICD-10-CM | POA: Insufficient documentation

## 2021-01-10 DIAGNOSIS — R0789 Other chest pain: Secondary | ICD-10-CM

## 2021-01-10 DIAGNOSIS — Z7982 Long term (current) use of aspirin: Secondary | ICD-10-CM | POA: Insufficient documentation

## 2021-01-10 DIAGNOSIS — F17210 Nicotine dependence, cigarettes, uncomplicated: Secondary | ICD-10-CM | POA: Insufficient documentation

## 2021-01-10 DIAGNOSIS — J45909 Unspecified asthma, uncomplicated: Secondary | ICD-10-CM | POA: Insufficient documentation

## 2021-01-10 DIAGNOSIS — F1490 Cocaine use, unspecified, uncomplicated: Secondary | ICD-10-CM | POA: Insufficient documentation

## 2021-01-10 DIAGNOSIS — F333 Major depressive disorder, recurrent, severe with psychotic symptoms: Secondary | ICD-10-CM

## 2021-01-10 DIAGNOSIS — R079 Chest pain, unspecified: Secondary | ICD-10-CM | POA: Diagnosis present

## 2021-01-10 DIAGNOSIS — R440 Auditory hallucinations: Secondary | ICD-10-CM | POA: Insufficient documentation

## 2021-01-10 DIAGNOSIS — R45851 Suicidal ideations: Secondary | ICD-10-CM

## 2021-01-10 DIAGNOSIS — Z7951 Long term (current) use of inhaled steroids: Secondary | ICD-10-CM | POA: Insufficient documentation

## 2021-01-10 DIAGNOSIS — J449 Chronic obstructive pulmonary disease, unspecified: Secondary | ICD-10-CM | POA: Insufficient documentation

## 2021-01-10 LAB — COMPREHENSIVE METABOLIC PANEL
ALT: 34 U/L (ref 0–44)
AST: 21 U/L (ref 15–41)
Albumin: 3.7 g/dL (ref 3.5–5.0)
Alkaline Phosphatase: 52 U/L (ref 38–126)
Anion gap: 9 (ref 5–15)
BUN: 10 mg/dL (ref 8–23)
CO2: 27 mmol/L (ref 22–32)
Calcium: 8.7 mg/dL — ABNORMAL LOW (ref 8.9–10.3)
Chloride: 103 mmol/L (ref 98–111)
Creatinine, Ser: 0.74 mg/dL (ref 0.44–1.00)
GFR, Estimated: >60 mL/min (ref >60)
Glucose, Bld: 99 mg/dL (ref 70–99)
Potassium: 4.2 mmol/L (ref 3.5–5.1)
Sodium: 139 mmol/L (ref 135–145)
Total Bilirubin: 0.3 mg/dL (ref 0.3–1.2)
Total Protein: 6.8 g/dL (ref 6.5–8.1)

## 2021-01-10 LAB — ETHANOL: Alcohol, Ethyl (B): <10 mg/dL (ref <10)

## 2021-01-10 LAB — CBC
HCT: 47.9 % — ABNORMAL HIGH (ref 36.0–46.0)
Hemoglobin: 14.9 g/dL (ref 12.0–15.0)
MCH: 29.6 pg (ref 26.0–34.0)
MCHC: 31.1 g/dL (ref 30.0–36.0)
MCV: 95 fL (ref 80.0–100.0)
Platelets: 272 10*3/uL (ref 150–400)
RBC: 5.04 MIL/uL (ref 3.87–5.11)
RDW: 13 % (ref 11.5–15.5)
WBC: 11.2 10*3/uL — ABNORMAL HIGH (ref 4.0–10.5)
nRBC: 0 % (ref 0.0–0.2)

## 2021-01-10 LAB — RESP PANEL BY RT-PCR (FLU A&B, COVID) ARPGX2
Influenza A by PCR: NEGATIVE
Influenza B by PCR: NEGATIVE
SARS Coronavirus 2 by RT PCR: NEGATIVE

## 2021-01-10 LAB — RAPID URINE DRUG SCREEN, HOSP PERFORMED
Amphetamines: NOT DETECTED
Barbiturates: NOT DETECTED
Benzodiazepines: NOT DETECTED
Cocaine: NOT DETECTED
Opiates: NOT DETECTED
Tetrahydrocannabinol: NOT DETECTED

## 2021-01-10 LAB — SALICYLATE LEVEL: Salicylate Lvl: <7 mg/dL — ABNORMAL LOW (ref 7.0–30.0)

## 2021-01-10 LAB — TROPONIN I (HIGH SENSITIVITY)
Troponin I (High Sensitivity): 5 ng/L (ref <18)
Troponin I (High Sensitivity): 5 ng/L (ref <18)

## 2021-01-10 LAB — ACETAMINOPHEN LEVEL: Acetaminophen (Tylenol), Serum: <10 ug/mL — ABNORMAL LOW (ref 10–30)

## 2021-01-10 MED ORDER — ESCITALOPRAM OXALATE 10 MG PO TABS
10.0000 mg | ORAL_TABLET | Freq: Every day | ORAL | Status: DC
  Filled 2021-01-10 (×2): qty 1

## 2021-01-10 MED ORDER — ACETAMINOPHEN 325 MG PO TABS: 650.0000 mg | ORAL_TABLET | ORAL | Status: DC | PRN

## 2021-01-10 MED ORDER — NICOTINE 21 MG/24HR TD PT24
21.0000 mg | MEDICATED_PATCH | Freq: Every day | TRANSDERMAL | Status: DC
  Administered 2021-01-10 – 2021-01-11 (×2): 21 mg via TRANSDERMAL
  Filled 2021-01-10 (×2): qty 1

## 2021-01-10 MED ORDER — ALUM & MAG HYDROXIDE-SIMETH 200-200-20 MG/5ML PO SUSP: 30.0000 mL | Freq: Four times a day (QID) | ORAL | Status: DC | PRN

## 2021-01-10 MED ORDER — ONDANSETRON HCL 4 MG PO TABS: 4.0000 mg | ORAL_TABLET | Freq: Three times a day (TID) | ORAL | Status: DC | PRN

## 2021-01-10 MED ORDER — LORAZEPAM 1 MG PO TABS
1.0000 mg | ORAL_TABLET | Freq: Once | ORAL | Status: AC
  Administered 2021-01-10: 1 mg via ORAL
  Filled 2021-01-10: qty 1

## 2021-01-10 MED ORDER — ZOLPIDEM TARTRATE 5 MG PO TABS: 5.0000 mg | ORAL_TABLET | Freq: Every evening | ORAL | Status: DC | PRN

## 2021-01-10 MED ORDER — BUDESONIDE 0.25 MG/2ML IN SUSP
2.0000 mL | Freq: Two times a day (BID) | RESPIRATORY_TRACT | Status: DC
  Administered 2021-01-10: 0.25 mg via RESPIRATORY_TRACT
  Filled 2021-01-10: qty 2

## 2021-01-10 MED ORDER — ALBUTEROL SULFATE HFA 108 (90 BASE) MCG/ACT IN AERS
2.0000 | INHALATION_SPRAY | Freq: Once | RESPIRATORY_TRACT | Status: AC
  Administered 2021-01-10: 2 via RESPIRATORY_TRACT
  Filled 2021-01-10: qty 6.7

## 2021-01-10 MED ORDER — POLYETHYLENE GLYCOL 3350 17 G PO PACK
17.0000 g | PACK | Freq: Every day | ORAL | Status: DC
  Administered 2021-01-10 – 2021-01-11 (×2): 17 g via ORAL
  Filled 2021-01-10 (×2): qty 1

## 2021-01-10 MED ORDER — LISINOPRIL 10 MG PO TABS
10.0000 mg | ORAL_TABLET | Freq: Every day | ORAL | Status: DC
  Administered 2021-01-10 – 2021-01-11 (×2): 10 mg via ORAL
  Filled 2021-01-10 (×2): qty 1

## 2021-01-10 MED ORDER — DIVALPROEX SODIUM 250 MG PO DR TAB
250.0000 mg | DELAYED_RELEASE_TABLET | Freq: Two times a day (BID) | ORAL | Status: DC
  Filled 2021-01-10: qty 1

## 2021-01-10 MED ORDER — GABAPENTIN 300 MG PO CAPS
600.0000 mg | ORAL_CAPSULE | Freq: Every day | ORAL | Status: DC
  Administered 2021-01-10: 600 mg via ORAL
  Filled 2021-01-10: qty 2

## 2021-01-10 NOTE — ED Notes (Signed)
Patient transferred to [facility] and became verbally aggressive and non compliant with staff request; Pt was advised she must put on scrubs provided and belongings will be taken and placed in locker to be given back upon leaving ED; pt yelling at staff and has obvious sob and staff has asked patient to put O2 back in to help her catch her breath; pt refuses and states she has to use the bathroom; Pt is is tearful and all over the place; RN spoke with EDP regarding possible IVC; EDP has spoke with patient and has decide to keep patient in voluntarily status for now; sitter remains at bedside; Pt continues to refuse to wear scrub patients but states when she finishes dinner she will place scrubs on-Monique,RN

## 2021-01-10 NOTE — ED Notes (Signed)
ED Provider at bedside. 

## 2021-01-10 NOTE — Discharge Instructions (Addendum)
Transfer to [facility] for medical clearance

## 2021-01-10 NOTE — Progress Notes (Signed)
Per Serafina Mitchell, MD patient meets gero/psych inpatient criteria.  Patient referred to the following facilities:  [facility]  [facility]  [facility]   Disposition to follow up.  [place] Disposition [phone] (cell)

## 2021-01-10 NOTE — ED Provider Notes (Signed)
[facility] EMERGENCY DEPARTMENT Provider Note   CSN: [id number] Arrival date & time: 01/10/21  1307     History Chief Complaint  Patient presents with  . Chest Pain  . Suicidal    Jasmine Werner is a 65 y.o. female.  The history is provided by the patient and medical records. No language interpreter was used.  Chest Pain    65 year old female significant history of schizophrenia, anxiety, alcohol abuse, COPD, polysubstance use sent here from [facility] for evaluation of chest pain.  Patient presents voluntarily to [facility] for suicidal ideation with plan as well as having auditory hallucination.  She also report discontinue her medication for nearly a year.  She also complaining of chest pain and shortness of breath and was recommended to come to the ER to be medically evaluated with plan for inpatient psychiatric hospitalization.  She reports chest pain started this morning, described as a pulling sensation across her chest lasting for several minutes but that has since resolved.  Pain was accompanied with some shortness of breath but patient reports she was feeling anxious at that time.  She denies any recent cocaine use, last use was approximately a year ago.  She does request for help with mental illness.  She denies any exertional chest pain diaphoresis or nausea.  She reported she is currently chest pain-free.  Past Medical History:  Diagnosis Date  . Alcohol use   . Allergy   . Anxiety   . Asthma   . Childhood schizophrenia ([place])   . Chronic abdominal pain   . Collagen vascular disease ([place])   . COPD (chronic obstructive pulmonary disease) [facility])     Patient Active Problem List   Diagnosis Date Noted  . MDD (major depressive disorder), recurrent, severe, with psychosis ([place]) 05/26/2020  . MDD (major depressive disorder), recurrent, in full remission ([place]) 12/02/2019  . Posttraumatic stress disorder 12/02/2019  . Cocaine use  disorder, moderate, in early remission ([place]) 12/02/2019  . MDD (major depressive disorder) 06/29/2019  . Unspecified abdominal pain 05/26/2019  . Rectal bleeding 05/26/2019  . Loss of weight 05/26/2019  . MDD (major depressive disorder), recurrent episode, moderate ([place]) 07/31/2018  . Alcohol use disorder, moderate, in sustained remission ([place]) 07/31/2018  . Primary osteoarthritis of right knee 10/08/2014    Past Surgical History:  Procedure Laterality Date  . ABDOMINAL HYSTERECTOMY    . APPENDECTOMY       OB History   No obstetric history on file.     Family History  Problem Relation Age of Onset  . Asthma Other   . COPD Other   . Alcoholism Other   . Heart attack Other   . Depression Other   . Mental illness Other   . Alcohol abuse Mother   . Colon cancer Mother 31  . Alcohol abuse Father   . Depression Father     Social History   Tobacco Use  . Smoking status: Current Every Day Smoker    Packs/day: 0.25    Types: Cigarettes  . Smokeless tobacco: Never Used  Vaping Use  . Vaping Use: Never used  Substance Use Topics  . Alcohol use: Not Currently  . Drug use: Yes    Types: Marijuana, Cocaine    Home Medications Prior to Admission medications   Medication Sig Start Date End Date Taking? Authorizing Provider  ASPIRIN 81 PO Take 1 tablet by mouth daily.     [provider]  divalproex (DEPAKOTE)  250 MG DR tablet Take 1 tablet (250 mg total) by mouth 2 (two) times daily. For mood stabilization 07/02/20   [name], MD  escitalopram (LEXAPRO) 10 MG tablet Start 5 mg daily for one week, then 10 mg daily 06/02/20   [name], MD  fluticasone (FLOVENT HFA) 44 MCG/ACT inhaler Inhale 2 puffs into the lungs 2 (two) times daily. For shortness of breath 06/01/20   Lindell Spar I, NP  gabapentin (NEURONTIN) 600 MG tablet Take 1 tablet (600 mg total) by mouth at bedtime. For agitation 07/02/20   [name], MD  ipratropium-albuterol (DUONEB) 0.5-2.5 (3) MG/3ML  SOLN Inhale 3 mLs into the lungs every 6 (six) hours as needed (Shortness of Breath).  Patient not taking: Reported on 05/25/2020 07/21/15   [provider]  lisinopril (ZESTRIL) 10 MG tablet Take 1 tablet (10 mg total) by mouth daily. 07/08/19   Connye Burkitt, NP  melatonin 10 MG TABS Take 10 mg by mouth at bedtime. For sleep 06/01/20   Lindell Spar I, NP  polyethylene glycol-electrolytes (TRILYTE) 420 g solution Take 4,000 mLs by mouth as directed. Patient not taking: Reported on 04/25/2020 05/26/19   Daneil Dolin, MD  rosuvastatin (CRESTOR) 5 MG tablet Take 5 mg by mouth daily.    [provider]  VENTOLIN HFA 108 (90 BASE) MCG/ACT inhaler Inhale 1 puff into the lungs every 6 (six) hours as needed for shortness of breath.  07/22/15   [provider]    Allergies    Tramadol  Review of Systems   Review of Systems  Cardiovascular: Positive for chest pain.  All other systems reviewed and are negative.   Physical Exam Updated Vital Signs BP (!) 136/95   Pulse (!) 107   Temp 98 F (36.7 C) (Oral)   Resp (!) 25   SpO2 95%   Physical Exam Vitals and nursing note reviewed.  Constitutional:      General: She is not in acute distress.    Appearance: She is well-developed and well-nourished. She is obese.  HENT:     Head: Atraumatic.  Eyes:     Conjunctiva/sclera: Conjunctivae normal.  Cardiovascular:     Rate and Rhythm: Normal rate and regular rhythm.     Pulses: Normal pulses.     Heart sounds: Normal heart sounds.  Pulmonary:     Effort: Pulmonary effort is normal.     Breath sounds: Normal breath sounds.  Chest:     Chest wall: No tenderness.  Abdominal:     Palpations: Abdomen is soft.     Tenderness: There is no abdominal tenderness.  Musculoskeletal:     Cervical back: Neck supple.  Skin:    Findings: No rash.  Neurological:     Mental Status: She is alert.     GCS: GCS eye subscore is 4. GCS verbal subscore is 5. GCS motor subscore is 6.   Psychiatric:        Attention and Perception: Attention normal.        Mood and Affect: Mood is anxious.        Speech: Speech normal.        Behavior: Behavior is cooperative.        Thought Content: Thought content includes suicidal ideation. Thought content does not include homicidal ideation.     ED Results / Procedures / Treatments   Labs (all labs ordered are listed, but only abnormal results are displayed) Labs Reviewed  COMPREHENSIVE METABOLIC PANEL -  Abnormal; Notable for the following components:      Result Value   Calcium 8.7 (*)    All other components within normal limits  SALICYLATE LEVEL - Abnormal; Notable for the following components:   Salicylate Lvl <7.1 (*)    All other components within normal limits  ACETAMINOPHEN LEVEL - Abnormal; Notable for the following components:   Acetaminophen (Tylenol), Serum <10 (*)    All other components within normal limits  CBC - Abnormal; Notable for the following components:   WBC 11.2 (*)    HCT 47.9 (*)    All other components within normal limits  RESP PANEL BY RT-PCR (FLU A&B, COVID) ARPGX2  ETHANOL  RAPID URINE DRUG SCREEN, HOSP PERFORMED  TROPONIN I (HIGH SENSITIVITY)  TROPONIN I (HIGH SENSITIVITY)    EKG EKG Interpretation  Date/Time:  Wednesday January 10 2021 13:19:25 EST Ventricular Rate:  106 PR Interval:  156 QRS Duration: 78 QT Interval:  356 QTC Calculation: 472 R Axis:   53 Text Interpretation: Sinus tachycardia Otherwise normal ECG Confirmed by Madalyn Rob [phone]) on 01/10/2021 6:12:45 PM   ED ECG REPORT   Radiology DG Chest 2 View  Result Date: 01/10/2021 CLINICAL DATA:  Chest pain EXAM: CHEST - 2 VIEW COMPARISON:  05/26/2020, CT 12/22/2020 FINDINGS: Lungs are well expanded, symmetric, and clear. No pneumothorax or pleural effusion. Cardiac size within normal limits. Right cardiophrenic angle opacity represents prominent epicardial fat, better seen on prior CT. Pulmonary vascularity is  normal. Osseous structures are age-appropriate. No acute bone abnormality. IMPRESSION: No active cardiopulmonary disease. Electronically Signed   By: Fidela Salisbury MD   On: 01/10/2021 13:52    Procedures Procedures   Medications Ordered in ED Medications  acetaminophen (TYLENOL) tablet 650 mg (has no administration in time range)  zolpidem (AMBIEN) tablet 5 mg (has no administration in time range)  ondansetron (ZOFRAN) tablet 4 mg (has no administration in time range)  alum & mag hydroxide-simeth (MAALOX/MYLANTA) 200-200-20 MG/5ML suspension 30 mL (has no administration in time range)  nicotine (NICODERM CQ - dosed in mg/24 hours) patch 21 mg (has no administration in time range)  divalproex (DEPAKOTE) DR tablet 250 mg (has no administration in time range)  escitalopram (LEXAPRO) tablet 10 mg (has no administration in time range)  fluticasone (FLOVENT HFA) 44 MCG/ACT inhaler 2 puff (has no administration in time range)  lisinopril (ZESTRIL) tablet 10 mg (has no administration in time range)    ED Course  I have reviewed the triage vital signs and the nursing notes.  Pertinent labs & imaging results that were available during my care of the patient were reviewed by me and considered in my medical decision making (see chart for details).    MDM Rules/Calculators/A&P                          BP (!) 136/95   Pulse (!) 107   Temp 98 F (36.7 C) (Oral)   Resp (!) 25   SpO2 95%   Final Clinical Impression(s) / ED Diagnoses Final diagnoses:  Suicidal ideation  Atypical chest pain    Rx / DC Orders ED Discharge Orders    None     6:07 PM PT was seen at [facility] for SI and AH, but did report having some cp/sob this AM.  sxs atypical of ACS.  CP resolved.  Work up here unremarkable. HEART score of 2, low risk of MACE. Pt is medically cleared.  Will consult TTS/psychiatry for further psychiatric management.     Domenic Moras, PA-C 01/10/21 1907    Lucrezia Starch, MD 01/12/21  [phone]
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
## 2021-01-10 NOTE — ED Triage Notes (Signed)
Pt from [facility] with ems, went there voluntarily with family due to [place], pt then started c.o chest pain, sent here foe further eval.

## 2021-01-10 NOTE — ED Provider Notes (Addendum)
Behavioral Health Urgent Care Medical Screening Exam  Patient Name: Jasmine Werner MRN: [id number] Date of Evaluation: 01/10/21 Chief Complaint: Chief Complaint/Presenting Problem: Worsening depression with SI and plan to take her life in a way that it looks like an accident "fall off my balcony." Diagnosis:  Final diagnoses:  MDD (major depressive disorder), recurrent, severe, with psychosis ([place])    History of Present illness: Jasmine Werner is a 65 y.o. female with a history of MDD with psychotic features, cocaine use,  who presents voluntarily to the [facility] for evaluation of SI; she is accompanied by her daughter. Pt is interviewed with and without daughter present. Pt states that she is in "mental anguish" and that she is "not safe". She states that she has been having suicidal thoughts recently and that she has been thinking about ways she could kill herself to make it look like an "Accident" such as "tripping over the balcony". She reports AH of "Screams" but that she cannot understand what is being said. Denies VH/HI. Pt is tearful and appears in moderate distress throughout  She states that she had stopped her medications in approximately June or July of last year because she did not feel like they were helping and due to an interaction she had with her pharmacist (see below). Per chart review, she was most recently seeing Dr. Modesta Messing on 06/02/2020 who had recommended doing a cross taper from venlafaxine to lexapro, and continues depakote 250 mg BID, gabapentin 600 qhs. Pt indicates that when she went to the pharmacy to get her medications, the pharmacist made a comment about being on 2 antidepressants at the same time and the pt became concerned about her medications and stopped everything. She was most recently hospitalized from 05/26/20-06/01/2020 of last year for SI. Of note, on chart review, appears that patient has been medication seeking, specifically requesting benzos, from outpatient  psychiatrist who declined. Per chart review, patient has been on several medications including venlafaxine, abilify, trazodone, depakote, trileptal, seroquel.  Patient reports SOB and CP during assessment. Pt states that she has difficulty thinking which she partially attributes to SOB. She states that she has requested home oxygen from outpatient provider although has not received it yet.   Discussed with patient and daughter in conjunction with TTS that inpatient psychiatric hospitalization is recommended at present as patient is unable to contract for safety. Also discussed recommendation of transfer to [facility] for medical clearance prior to seeking bed placement. Patient and daughter verbalized understanding.   Psychiatric Specialty Exam  Presentation  General Appearance:Appropriate for Environment; Casual  Eye Contact:Fair  Speech:Clear and Coherent  Speech Volume:Normal  Handedness:No data recorded  Mood and Affect  Mood:Dysphoric; Depressed  Affect:Tearful; Constricted; Other (comment) (depressed)   Thought Process  Thought Processes:Coherent; Linear  Descriptions of Associations:Intact  Orientation:Full (Time, Place and Person)  Thought Content:WDL  Hallucinations:Auditory AH of "screaming"  Ideas of Reference:None  Suicidal Thoughts:Yes, Active (plans to jump off balcony and make it appear as accident) With Intent; With Plan; With Means to [place]; With Access to Means  Homicidal Thoughts:No   Sensorium  Memory:Immediate Good; Recent Good; Remote Fair  Judgment:Intact  Insight:Fair   Executive Functions  Concentration:Fair  Attention Span:Fair  [place]   Psychomotor Activity  Psychomotor Activity:Normal   Assets  Assets:Communication Skills; Desire for Improvement; Housing; Social Support   Sleep  Sleep:Fair  Number of hours: No data recorded  Physical Exam: Physical  Exam Constitutional:  Appearance: Normal appearance. She is obese.  HENT:     Head: Normocephalic and atraumatic.  Eyes:     Extraocular Movements: Extraocular movements intact.  Pulmonary:     Comments: Increased effort Neurological:     Mental Status: She is alert.    Review of Systems  Respiratory: Positive for cough and shortness of breath.   Cardiovascular: Positive for chest pain.  Gastrointestinal: Positive for constipation and nausea.  Psychiatric/Behavioral: Positive for depression and suicidal ideas.   Blood pressure 116/76, pulse (!) 119, temperature 98.4 F (36.9 C), temperature source Oral, resp. rate (!) 22, height 5\' 6"  (1.676 m), weight 108.9 kg, SpO2 92 %. Body mass index is 38.74 kg/m.  Musculoskeletal: Strength & Muscle Tone: within normal limits Gait & Station: normal Patient leans: N/A   [facility] M[facility] Discharge Disposition for Follow up and Recommendations: Based on my evaluation the patient appears to have an emergency medical condition for which I recommend the patient be transferred to the emergency department for further evaluation. (work up of SOB and CP)  -can start lexapro 10 mg as previously recommended by outpatient psych MD  -recommend inpatient psychiatric hospitalization. Patient would be most appropriate for geriatric placement at this time. Recommend to consult SW once medically cleared. If additional help is needed from psychiatry service while in the ED-please consult TTS/psychiatry.    Ival Bible, MD 01/10/2021, 1:03 PM

## 2021-01-10 NOTE — Progress Notes (Signed)
Received Jasmine Werner at the [facility] for depression and after initial assessment and C/O of CP and SOB she was sent to [facility]. EMS was called and transported without incident.

## 2021-01-10 NOTE — ED Notes (Signed)
Patient was not required to dress out in purple scrubs per triage RN.

## 2021-01-10 NOTE — ED Notes (Signed)
Pt in triage with sitter. Pt refused to change into burgundy scrubs as asked by staff, stating she didn't feel it was necessary. Sitter at bedside.

## 2021-01-10 NOTE — ED Notes (Signed)
Deneise Lever (Daughter#[phone]) called for an update on patient's status.  Thank you

## 2021-01-10 NOTE — ED Notes (Signed)
Patient at desk speaking with daughter regarding care; Pt is tearful and voiced concerns about now being able to have belongings; This RN has explained to patient the reasoning for taken belongings and provided the policy form-Monique,RN

## 2021-01-11 MED ORDER — MOMETASONE FURO-FORMOTEROL FUM 200-5 MCG/ACT IN AERO
2.0000 | INHALATION_SPRAY | Freq: Two times a day (BID) | RESPIRATORY_TRACT | Status: DC
  Administered 2021-01-11: 2 via RESPIRATORY_TRACT
  Filled 2021-01-11: qty 8.8

## 2021-01-11 NOTE — Progress Notes (Addendum)
Pt accepted to [facility]    Dr. Juliane Lack is the accepting/attending provider.    Call report to [place] @ [facility] ED notified.     [facility] is requesting that pt be placed under IVC prior to transport. Pt's RN will speak to ED provider.   If placed under IVC, pt may arrive as soon as transportation is arranged.   Completed IVC paperwork should be faxed to Amber in Intake at [facility] at B and E, MSW, [place], [place] Worker II Disposition CSW [phone]

## 2021-01-11 NOTE — ED Notes (Signed)
Sheriff called for transport  

## 2021-01-11 NOTE — ED Notes (Addendum)
Pt transferred to [facility] per MD order. Pt IVC; sheriff here to transport, personal property from locker given to sheriff for transfer. Pt daughter Deneise Lever called, and notified per request that sheriff is here for transfer. Pt daughter actually in route to drop off bag of personal property, the one bag of property was  Received and it was given to sheriff for transfer. Pt  off unit in law enforcement custody. No s/s of acute distress noted at discharge. Marya Amsler RN at [facility] notified that pt was in route to facility.

## 2021-01-11 NOTE — ED Notes (Signed)
EDP [place] to IVC pt.

## 2021-01-11 NOTE — ED Notes (Signed)
Patient was given a Snack and drink.

## 2021-01-11 NOTE — ED Notes (Signed)
Pt at nurses station talking to daughter on phone.

## 2021-01-11 NOTE — Progress Notes (Signed)
Pt spoke with pt's daughter Deneise Lever [phone]). She is aware that pt is being placed under IVC and has been accepted to [facility]. She requests that she be contacted prior to transport to speak with her mother, as pt may become fearful/aggitated when realizing she will be transported by Event organiser. Daughter asks that either pt's RN or an ED CSW also be part of the conversation and reports that pt responds well to kind, gentle encouragement vs forceful commands.   Daughter also reports that she is pt's POA.   Caryl Pina @ [facility] ED notified.    Audree Camel, MSW, LCSW, [place] Clinical Social Worker II Disposition CSW [phone]

## 2021-01-11 NOTE — ED Notes (Signed)
Please call daughter Deneise Lever with an update at [phone]

## 2021-01-11 NOTE — ED Notes (Signed)
EDP Melina Copa

## 2021-04-11 ENCOUNTER — Encounter: Payer: Self-pay | Admitting: *Deleted

## 2021-04-11 ENCOUNTER — Other Ambulatory Visit: Payer: Self-pay | Admitting: *Deleted

## 2021-04-11 NOTE — Patient Outreach (Signed)
[facility]) Care Management  [place]  04/12/2021   Jasmine Werner Jun 25, 1956 [id number]    Referral Date: 5/6 Referral Source: MD office Referral Reason: Care management for severe depression.  Insurance: [employer] attempt #1, successful.  Identity verified.  This care manager introduced self and stated purpose of call.  [facility] care management services explained.     Social:  Lives alone, report she has adult son and daughter that are in the area that help when needed.    Conditions: Per chart and member, history of COPD, HTN, newly diagnosed DM (A1C - 8.5), depression, and osteoarthritis.  Does not have glucose meter or blood pressure machine for daily monitoring.  Uses home O2 for COPD management, Oxygen levels average 88-90%.    Medications: Reviewed with member, Epic list updated.  Report taking as instructed.  Uses alarm as reminders for medication administration.  Depression medication currently changed, does not actively see a counselor, agrees to follow up with CSW.  Appointments: Follow up with PCP on 6/1, will call to request prescription for glucose meter.  Advance Directives: Does not have POA or Advanced Directives, agrees to receiving paperwork to complete.  Consent:  Agrees to [facility] involvement.  Encounter Medications:  Outpatient Encounter Medications as of 04/11/2021  Medication Sig Note  . budesonide-formoterol (SYMBICORT) 160-4.5 MCG/ACT inhaler Inhale 2 puffs into the lungs 2 (two) times daily.   . fluvoxaMINE (LUVOX) 100 MG tablet Take 100 mg by mouth at bedtime.   . gabapentin (NEURONTIN) 600 MG tablet Take 1 tablet (600 mg total) by mouth at bedtime. For agitation 04/11/2021: Taking 800 mg at bedtime  . ipratropium-albuterol (DUONEB) 0.5-2.5 (3) MG/3ML SOLN Inhale 3 mLs into the lungs every 6 (six) hours as needed (Shortness of Breath).   Marland Kitchen lisinopril (ZESTRIL) 10 MG tablet Take 1 tablet (10 mg total) by mouth daily.   .  melatonin 10 MG TABS Take 10 mg by mouth at bedtime. For sleep   . metFORMIN (GLUCOPHAGE) 500 MG tablet Take 1,000 mg by mouth 2 (two) times daily with a meal.   . risperiDONE (RISPERDAL) 1 MG tablet Take 1 mg by mouth at bedtime.   . rosuvastatin (CRESTOR) 5 MG tablet Take 5 mg by mouth daily.   Marland Kitchen tiZANidine (ZANAFLEX) 4 MG tablet Take 4 mg by mouth at bedtime as needed for muscle spasms.   . VENTOLIN HFA 108 (90 BASE) MCG/ACT inhaler Inhale 1 puff into the lungs every 6 (six) hours as needed for shortness of breath.    . ASPIRIN 81 PO Take 81 mg by mouth at bedtime. (Patient not taking: Reported on 04/11/2021)   . divalproex (DEPAKOTE) 250 MG DR tablet Take 1 tablet (250 mg total) by mouth 2 (two) times daily. For mood stabilization (Patient not taking: No sig reported)   . escitalopram (LEXAPRO) 10 MG tablet Start 5 mg daily for one week, then 10 mg daily (Patient not taking: No sig reported)   . fluticasone (FLOVENT HFA) 44 MCG/ACT inhaler Inhale 2 puffs into the lungs 2 (two) times daily. For shortness of breath (Patient not taking: Reported on 04/11/2021)   . polyethylene glycol-electrolytes (TRILYTE) 420 g solution Take 4,000 mLs by mouth as directed. (Patient not taking: No sig reported)    No facility-administered encounter medications on file as of 04/11/2021.    Functional Status:  No flowsheet data found.  Fall/Depression Screening: Fall Risk  04/11/2021  Falls in the past year? 0  Number  falls in past yr: 0  Injury with Fall? 0   PHQ 2/9 Scores 04/11/2021  PHQ - 2 Score 6  PHQ- 9 Score 27  Some encounter information is confidential and restricted. Go to Review Flowsheets activity to see all data.    Assessment:  Goals Addressed            This Visit's Progress   . [facility] - Monitor and Manage My Blood Sugar-Diabetes Type 2       Timeframe:  Short-Term Goal Priority:  High Start Date:           5/11                  Expected End Date:       6/11                Follow Up  Date 5/26  Barriers: Knowledge Psychosocial    - enter blood sugar readings and medication or insulin into daily log - take the blood sugar log to all doctor visits    Why is this important?    Checking your blood sugar at home helps to keep it from getting very high or very low.   Writing the results in a diary or log helps the doctor know how to care for you.   Your blood sugar log should have the time, date and the results.   Also, write down the amount of insulin or other medicine that you take.   Other information, like what you ate, exercise done and how you were feeling, will also be helpful.     Notes:   5/11 - Encouraged to ask PCP for prescription for glucose meter    . [facility] - Set My Target A1C-Diabetes Type 2       Timeframe:  Long-Range Goal Priority:  Medium Start Date:            5/11                 Expected End Date:   8/11                    Follow Up Date 5/26  Barriers: Knowledge - newly diagnosed    - set target A1C - 7   Why is this important?    Your target A1C is decided together by you and your doctor.   It is based on several things like your age and other health issues.    Notes:   5/11 - EMMI diabetes education assigned, book mailed       Plan:  Follow-up:  Patient agrees to Care Plan and Follow-up.  Will place referral to [place].  Will send education regarding diabetes and HTN management.  Will send Advanced Directive Education.  Will send blood pressure monitor.  Will notify PCP of [facility] involvement.  Will follow up with member within the next 2 weeks.  Valente David, [place], MSN [place] [phone]

## 2021-04-12 NOTE — Patient Instructions (Signed)
Critical care medicine: Principles of diagnosis and management in the adult (4th ed., pp. 1251-1270). Saunders."> Miller's anesthesia (8th ed., pp. 232-250). Saunders.">  Advance Directive  Advance directives are legal documents that allow you to make decisions about your health care and medical treatment in case you become unable to communicate for yourself. Advance directives let your wishes be known to family, friends, and health care providers. Discussing and writing advance directives should happen over time rather than all at once. Advance directives can be changed and updated at any time. There are different types of advance directives, such as:  Medical power of attorney.  Living will.  Do not resuscitate (DNR) order or do not attempt resuscitation (DNAR) order. Health care proxy and medical power of attorney A health care proxy is also called a health care agent. This person is appointed to make medical decisions for you when you are unable to make decisions for yourself. Generally, people ask a trusted friend or family member to act as their proxy and represent their preferences. Make sure you have an agreement with your trusted person to act as your proxy. A proxy may have to make a medical decision on your behalf if your wishes are not known. A medical power of attorney, also called a durable power of attorney for health care, is a legal document that names your health care proxy. Depending on the laws in your state, the document may need to be:  Signed.  Notarized.  Dated.  Copied.  Witnessed.  Incorporated into your medical record. You may also want to appoint a trusted person to manage your money in the event you are unable to do so. This is called a durable power of attorney for finances. It is a separate legal document from the durable power of attorney for health care. You may choose your health care proxy or someone different to act as your agent in money matters. If you  do not appoint a proxy, or there is a concern that the proxy is not acting in your best interest, a court may appoint a guardian to act on your behalf. Living will A living will is a set of instructions that state your wishes about medical care when you cannot express them yourself. Health care providers should keep a copy of your living will in your medical record. You may want to give a copy to family members or friends. To alert caregivers in case of an emergency, you can place a card in your wallet to let them know that you have a living will and where they can find it. A living will is used if you become:  Terminally ill.  Disabled.  Unable to communicate or make decisions. The following decisions should be included in your living will:  To use or not to use life support equipment, such as dialysis machines and breathing machines (ventilators).  Whether you want a DNR or DNAR order. This tells health care providers not to use cardiopulmonary resuscitation (CPR) if breathing or heartbeat stops.  To use or not to use tube feeding.  To be given or not to be given food and fluids.  Whether you want comfort (palliative) care when the goal becomes comfort rather than a cure.  Whether you want to donate your organs and tissues. A living will does not give instructions for distributing your money and property if you should pass away. DNR or DNAR A DNR or DNAR order is a request not to have CPR in   the event that your heart stops beating or you stop breathing. If a DNR or DNAR order has not been made and shared, a health care provider will try to help any patient whose heart has stopped or who has stopped breathing. If you plan to have surgery, talk with your health care provider about how your DNR or DNAR order will be followed if problems occur. What if I do not have an advance directive? Some states assign family decision makers to act on your behalf if you do not have an advance directive.  Each state has its own laws about advance directives. You may want to check with your health care provider, attorney, or state representative about the laws in your state. Summary  Advance directives are legal documents that allow you to make decisions about your health care and medical treatment in case you become unable to communicate for yourself.  The process of discussing and writing advance directives should happen over time. You can change and update advance directives at any time.  Advance directives may include a medical power of attorney, a living will, and a DNR or DNAR order. This information is not intended to replace advice given to you by your health care provider. Make sure you discuss any questions you have with your health care provider. Document Revised: 08/22/2020 Document Reviewed: 08/22/2020 Elsevier Patient Education  2021 Elsevier Inc.  

## 2021-04-25 ENCOUNTER — Other Ambulatory Visit: Payer: Self-pay | Admitting: *Deleted

## 2021-04-25 ENCOUNTER — Telehealth: Payer: Self-pay | Admitting: *Deleted

## 2021-04-25 NOTE — Patient Outreach (Signed)
[place] [facility]) Care Management  04/25/2021  Jasmine Werner 06-08-1956 [id number]  CSW spoke with pt by phone today and confirmed pt's identity.  Pt reports she is on her way to find a watermelon at the farmers market. Pt in good spirits and agrees to plans for a referral to be placed to [place] for linking pt with a mental health counselor who is in network and local; as pt wants "in person" sessions.  CSW will make referral to [place] and have advised pt to expect callback from them with specifics for counselor; name, location, day and time of initial appointment.   CSW will touch base with pt again in the next 10 days for updates.    Eduard Clos, MSW, [place] Worker  [place] [phone]

## 2021-04-26 ENCOUNTER — Other Ambulatory Visit: Payer: Self-pay | Admitting: *Deleted

## 2021-04-26 NOTE — Patient Outreach (Signed)
[facility] [facility]) Care Management  04/26/2021  Karia Ehresman 1956-01-18 [id number]   Outgoing call placed to member to review HTN and DM management, no answer, unable to leave message as mailbox is full.  Will follow up within the next 3-4 business days.  Valente David, [place], MSN [place] [phone]

## 2021-04-26 NOTE — Patient Outreach (Signed)
[place] [facility]) Care Management  [facility] Social Work  04/26/2021  LATE ENTRY  Kerrie Timm 08-09-1956 [id number]  Subjective:  Pt referred to [place] on 04/11/2021 due to "depression, not active with counselor".  Objective:  Provide assessment/support and link with counselor  Encounter Medications:  Outpatient Encounter Medications as of 04/25/2021  Medication Sig Note  . ASPIRIN 81 PO Take 81 mg by mouth at bedtime. (Patient not taking: Reported on 04/11/2021)   . budesonide-formoterol (SYMBICORT) 160-4.5 MCG/ACT inhaler Inhale 2 puffs into the lungs 2 (two) times daily.   . divalproex (DEPAKOTE) 250 MG DR tablet Take 1 tablet (250 mg total) by mouth 2 (two) times daily. For mood stabilization (Patient not taking: No sig reported)   . escitalopram (LEXAPRO) 10 MG tablet Start 5 mg daily for one week, then 10 mg daily (Patient not taking: No sig reported)   . fluticasone (FLOVENT HFA) 44 MCG/ACT inhaler Inhale 2 puffs into the lungs 2 (two) times daily. For shortness of breath (Patient not taking: Reported on 04/11/2021)   . fluvoxaMINE (LUVOX) 100 MG tablet Take 100 mg by mouth at bedtime.   . gabapentin (NEURONTIN) 600 MG tablet Take 1 tablet (600 mg total) by mouth at bedtime. For agitation 04/11/2021: Taking 800 mg at bedtime  . ipratropium-albuterol (DUONEB) 0.5-2.5 (3) MG/3ML SOLN Inhale 3 mLs into the lungs every 6 (six) hours as needed (Shortness of Breath).   Marland Kitchen lisinopril (ZESTRIL) 10 MG tablet Take 1 tablet (10 mg total) by mouth daily.   . melatonin 10 MG TABS Take 10 mg by mouth at bedtime. For sleep   . metFORMIN (GLUCOPHAGE) 500 MG tablet Take 1,000 mg by mouth 2 (two) times daily with a meal.   . polyethylene glycol-electrolytes (TRILYTE) 420 g solution Take 4,000 mLs by mouth as directed. (Patient not taking: No sig reported)   . risperiDONE (RISPERDAL) 1 MG tablet Take 1 mg by mouth at bedtime.   . rosuvastatin (CRESTOR) 5 MG tablet Take 5 mg by mouth daily.   Marland Kitchen  tiZANidine (ZANAFLEX) 4 MG tablet Take 4 mg by mouth at bedtime as needed for muscle spasms.   . VENTOLIN HFA 108 (90 BASE) MCG/ACT inhaler Inhale 1 puff into the lungs every 6 (six) hours as needed for shortness of breath.     No facility-administered encounter medications on file as of 04/25/2021.    Functional Status:  No flowsheet data found.  Fall/Depression Screening:  PHQ 2/9 Scores 04/11/2021  PHQ - 2 Score 6  PHQ- 9 Score 27  Some encounter information is confidential and restricted. Go to Review Flowsheets activity to see all data.    Assessment:   CSW spoke with pt by phone on 04/12/2021 and confirmed pt identity. CSW then introduced self, role and reason for call. Pt admits to dealing with depression "that surfaced when my last kid left home in 2016". Pt shared with CSW she began to feel she had "no purpose".   She was set up to see a [facility] therapist outpatient but was placed on a wait list during [place]. Per pt, on the day of her appointment, she called to ask for it to be a "virtual" and states they placed her on a "do not see" list for missing her appointment.... Pt also shared with CSW she had a suicide attempt in January 2022 (overdose). She denies any current SI/HI.  She is taking a new medication since February and she thinks it is helping. She has not  seen a Psychiatrist as on outpatient and at this time is interested in being set up for outpatient counseling and is agreeable to CSW seeking in-network options for her.  Pt is open to going to [place], or into [place] if needed for these sessions.   Pt also shared with CSW of a history of drug abuse and "relapse" but states; "I am clean now".   She is optimistic, has supportive family (heading to meet a daughter for lunch) and states; "I have a purpose".   Pt also shared she has a supportive church family with counseling support available too.   Goals Addressed CSW to assist pt with finding in-network counselor for  outpatient mental health support.   None     Plan:  Follow-up:  Patient agrees to Care Plan and Follow-up.   Eduard Clos, MSW, [place] Worker  [place] [phone]

## 2021-05-02 ENCOUNTER — Other Ambulatory Visit: Payer: Self-pay | Admitting: *Deleted

## 2021-05-02 NOTE — Patient Outreach (Signed)
[place] [facility]) Care Management  05/02/2021  Zaylee Cornia 03-24-1956 [id number]   Outreach attempt #2, unsuccessful.  Will send outreach letter and follow up within the next 3-4 business days.  Valente David, [place], MSN [place] [phone]

## 2021-05-07 ENCOUNTER — Other Ambulatory Visit: Payer: Self-pay | Admitting: *Deleted

## 2021-05-07 NOTE — Patient Outreach (Signed)
[place] [facility]) Care Management  05/07/2021  Jalexia Lalli 1956-02-01 [id number]   Outreach attempt #3, successful however member state this is not a good time to talk.  State she will call this care manager back.  Will await call back, if no call back will make 4th and final attempt within the next 4 weeks.  If remain unsuccessful, will close case due to inability to maintain contact.  Valente David, [place], MSN [place] [phone]

## 2021-05-09 ENCOUNTER — Other Ambulatory Visit (HOSPITAL_COMMUNITY): Payer: Self-pay | Admitting: Family Medicine

## 2021-05-09 ENCOUNTER — Other Ambulatory Visit: Payer: Self-pay | Admitting: Family Medicine

## 2021-05-11 ENCOUNTER — Other Ambulatory Visit: Payer: Self-pay | Admitting: Family Medicine

## 2021-05-11 ENCOUNTER — Other Ambulatory Visit (HOSPITAL_COMMUNITY): Payer: Self-pay | Admitting: Family Medicine

## 2021-05-11 DIAGNOSIS — R911 Solitary pulmonary nodule: Secondary | ICD-10-CM

## 2021-05-24 ENCOUNTER — Telehealth: Payer: Self-pay | Admitting: *Deleted

## 2021-05-24 ENCOUNTER — Other Ambulatory Visit: Payer: Medicare HMO | Admitting: *Deleted

## 2021-05-24 NOTE — Patient Outreach (Signed)
[place] [facility]) Care Management  05/24/2021  Jasmine Werner 1956/07/04 [id number]   CSW spoke with pt today by phone. She reports having increased anxiety and depression symptoms; stating, "I need to be more accepting of my limitations". She has been advised to use her O2 all the time but does not want to- CSW encouraged pt to be compliant with this to help aide her breathing struggles- especially with the hot humid days we are having.  CSW also updated pt on the counseling appointment facilitator had been unable to reach her and no return calls so have closed the referral. Pt states she had some phone issues and is still interested in seeking in-person mental health counseling.  CSW will place a referral for [facility] to re-engage and contact pt to set up.   Pt shared with CSW that she is hoping to get a "rollator walker" and thought her PCP had ordered.  CSW suggested to pt that she call the PCP  office this morning and inquire; Pt will call PCP office and ask to pick up a prescription/order for the DME and then take it to a local DME provider- reminded pt to take her Insurance cards for the DME company to bill.  Pt is excited to see her son and his family who are enroute from [place] to visit. "I will meet my 57month old grandson for the first time in person". Pt is hoping to have the rollator walker so she can participate in activities while they are here- "taking my 72yo granddaughter to the [facility] and I want to be able to go but be able to sit/rest with the walker".  Pt to call CSW if nothing is resolved on the DME later today to help expedite.   CSW reminded pt to expect a phone call from an unknown # Nurse, children's) soon to coordinate her counseling appointment- CSW stressed to pt the importance of the counseling component to help with her anxiety, depression and overall coping.   CSW will follow up with pt in the next 10 days for updates.   Eduard Clos, MSW, [place]  Worker  [place] [phone]

## 2021-05-28 ENCOUNTER — Other Ambulatory Visit: Payer: Self-pay | Admitting: *Deleted

## 2021-05-28 NOTE — Patient Outreach (Signed)
[facility]) Care Management  05/28/2021  Luisana Lutzke 01-21-1956 [id number]   Outreach attempt #3, successful.  Report she is doing "ok" but having a panic attack currently.  State this happens often when she wakes up.  She has been using her techniques to recover from attack as well as using her inhaler/nebulizer to control her breathing.  She voices concern regarding a spot found on her lungs and needing a PET scan.  Was scheduled for one tomorrow but that has since been canceled.  Call placed to PCP office, spoke with Caitlyn, aware test was canceled due to insurance coverage.  She is working to get it approved through [employer] and will call member back with update, member aware.  Denies any urgent concerns, encouraged to contact this care manager with questions.  Agrees to follow up within the next month.   Goals Addressed             This Visit's Progress    [facility] - Monitor and Manage My Blood Sugar-Diabetes Type 2   On track    Timeframe:  Short-Term Goal Priority:  High Start Date:           5/11                  Expected End Date:       7/11         Follow Up Date 5/26  Barriers: Knowledge Psychosocial    - enter blood sugar readings and medication or insulin into daily log - take the blood sugar log to all doctor visits    Why is this important?   Checking your blood sugar at home helps to keep it from getting very high or very low.  Writing the results in a diary or log helps the doctor know how to care for you.  Your blood sugar log should have the time, date and the results.  Also, write down the amount of insulin or other medicine that you take.  Other information, like what you ate, exercise done and how you were feeling, will also be helpful.     Notes:   5/11 - Encouraged to ask PCP for prescription for glucose meter  6/27 - Has started monitoring blood sugars, range 70's-mid 100s.  Highest of 200 once after meal.      [facility] - Set My Target  A1C-Diabetes Type 2   On track    Timeframe:  Long-Range Goal Priority:  Medium Start Date:            5/11                 Expected End Date:   8/11                    Follow Up Date 5/26  Barriers: Knowledge - newly diagnosed    - set target A1C - 7   Why is this important?   Your target A1C is decided together by you and your doctor.  It is based on several things like your age and other health issues.    Notes:   5/11 - EMMI diabetes education assigned, book mailed  6/27 - Confirms she has received education and reading material.          Valente David, RN, MSN [place] [phone]

## 2021-05-29 ENCOUNTER — Encounter (HOSPITAL_COMMUNITY): Payer: Self-pay

## 2021-05-29 ENCOUNTER — Ambulatory Visit (HOSPITAL_COMMUNITY): Payer: Medicare HMO

## 2021-05-31 ENCOUNTER — Ambulatory Visit: Payer: Self-pay | Admitting: *Deleted

## 2021-05-31 ENCOUNTER — Telehealth: Payer: Self-pay | Admitting: *Deleted

## 2021-05-31 NOTE — Patient Outreach (Signed)
[place] [facility]) Care Management  05/31/2021  Tashunda Vandezande 1956-09-16 [id number]   CSW spoke with pt and advised her [facility] had sent her info for her counseling services- pt reports not getting. CSW will ask that they send it again.  Pt also shared that her PET scan was postponed and will be on 06/19/21. She also confirms receiving RX/order for the rollator walker she wants.   She was able to enjoy her family visit and "was sad to see them go".   CSW stressed to pt to listen for a message from [place] and to follow through with the next steps for arranging her counseling.   CSW will follow up to confirm receipt of this and next steps.   Eduard Clos, MSW, [place] Worker  [place] [phone]

## 2021-06-19 ENCOUNTER — Other Ambulatory Visit: Payer: Self-pay

## 2021-06-19 ENCOUNTER — Encounter (HOSPITAL_COMMUNITY)
Admission: RE | Admit: 2021-06-19 | Discharge: 2021-06-19 | Disposition: A | Discharge status: Home / Self Care | Payer: Medicare HMO | Source: Ambulatory Visit | Attending: Family Medicine | Admitting: Family Medicine

## 2021-06-19 DIAGNOSIS — R911 Solitary pulmonary nodule: Secondary | ICD-10-CM

## 2021-06-19 LAB — GLUCOSE, CAPILLARY: Glucose-Capillary: 139 mg/dL — ABNORMAL HIGH (ref 70–99)

## 2021-06-19 MED ORDER — FLUDEOXYGLUCOSE F - 18 (FDG) INJECTION
12.0000 | Freq: Once | INTRAVENOUS | Status: AC
  Administered 2021-06-19: 12 via INTRAVENOUS

## 2021-06-20 ENCOUNTER — Telehealth: Payer: Self-pay | Admitting: *Deleted

## 2021-06-20 ENCOUNTER — Other Ambulatory Visit: Payer: Self-pay | Admitting: *Deleted

## 2021-06-20 NOTE — Patient Outreach (Signed)
[facility]) Care Management  06/20/2021  Jasmine Werner 01/19/1956 [id number]  CSW attempted to reach patient for phone followup and was unable to reach. CSW left a HIPPA compliant voicemail message and will try again if no response in 7-10 days.  Eduard Clos, MSW, [place] Worker  [place] [phone]

## 2021-06-20 NOTE — Patient Outreach (Signed)
[facility]) Care Management  06/20/2021  Jasmine Werner 1956-02-20 [id number]   Outgoing call placed to member, state she is doing well but does not have time to talk.  Confirms she was able to have PET scan completed, waiting for provider to call for follow up appointment.  Denies any urgent concerns, encouraged to contact this care manager with questions.  Agrees to follow up within the next month.   Goals Addressed             This Visit's Progress    [facility]N - Monitor and Manage My Blood Sugar-Diabetes Type 2   On track    Timeframe:  Short-Term Goal Priority:  High Start Date:           5/11                  Expected End Date:       7/11         Follow Up Date 5/26  Barriers: Knowledge Psychosocial    - enter blood sugar readings and medication or insulin into daily log - take the blood sugar log to all doctor visits    Why is this important?   Checking your blood sugar at home helps to keep it from getting very high or very low.  Writing the results in a diary or log helps the doctor know how to care for you.  Your blood sugar log should have the time, date and the results.  Also, write down the amount of insulin or other medicine that you take.  Other information, like what you ate, exercise done and how you were feeling, will also be helpful.     Notes:   5/11 - Encouraged to ask PCP for prescription for glucose meter  6/27 - Has started monitoring blood sugars, range 70's-mid 100s.  Highest of 200 once after meal.  7/20 - Encouraged to continue daily monitoring as well as diet management in effort to keep blood sugars controlled     [facility]N - Set My Target A1C-Diabetes Type 2   On track    Timeframe:  Long-Range Goal Priority:  Medium Start Date:            5/11                 Expected End Date:   8/11                    Follow Up Date 5/26  Barriers: Knowledge - newly diagnosed    - set target A1C - 7   Why is this important?   Your  target A1C is decided together by you and your doctor.  It is based on several things like your age and other health issues.    Notes:   5/11 - EMMI diabetes education assigned, book mailed  6/27 - Confirms she has received education and reading material.    7/20 - Agrees to contact this care manager with questions regarding DM management       Valente David, RN, MSN [place] Manager [phone]

## 2021-06-26 ENCOUNTER — Telehealth: Payer: Self-pay | Admitting: *Deleted

## 2021-06-26 NOTE — Patient Outreach (Signed)
[facility]) Care Management  06/26/2021  Jasmine Werner May 20, 1956 [id number]  CSW attempted to reach patient for phone appointment today and was unable to reach. CSW  left a HIPPA compliant message and will attempt again in the next 10 days if no call back received.  Eduard Clos, MSW, [place] Worker  [place] [phone]

## 2021-07-02 ENCOUNTER — Other Ambulatory Visit: Payer: Self-pay | Admitting: *Deleted

## 2021-07-02 NOTE — Patient Outreach (Signed)
[facility]) Care Management  07/02/2021  Loeva Meanor May 26, 1956 [id number]    CSW spoke with pt by phone today. Pt reports she has not called [facility] back to request a change in counselor/provider- she plans to do so and confirms she has the #.  CSW advised pt to call back if she has trouble with this; otherwise, CSW will touch base in 3-4 weeks for update.   Eduard Clos, MSW, [place] Worker  [place] [phone]

## 2021-07-25 ENCOUNTER — Other Ambulatory Visit: Payer: Self-pay | Admitting: *Deleted

## 2021-07-25 ENCOUNTER — Telehealth: Payer: Self-pay | Admitting: *Deleted

## 2021-07-25 NOTE — Patient Outreach (Signed)
[facility] [facility]) Care Management  07/25/2021  Jasmine Werner [birth year] [id number]  [place] spoke with pt who reports she has decided to "just talk to someone at my church" and does not want to pursue counseling support as previously arranged with [facility].  Pt does want to see if she can get some "help at home" due to her breathing getting worse "since I had COVID".  She shares that she has a friend getting CAPS services. Pt has Medicaid and thus can apply for the CAPS program (advised pt of a long wait list) and also instructed her she can also apply for the [facility] program through Medicaid that offers minimal hours of in-home "personal care services"/PCS. CSW will mail pt the application forms and she is advised to complete the portions for applicant and take to her PCP to complete the rest.  Reminded pt to call if she has questions or needs related to this; as well if she decides to pursue formal mental health counseling through the portal.  Eduard Clos, MSW, [place] Worker  [place] [phone]

## 2021-07-26 ENCOUNTER — Other Ambulatory Visit: Payer: Self-pay | Admitting: *Deleted

## 2021-07-26 NOTE — Patient Outreach (Signed)
Jasmine Werner) Care Management  [place]  07/28/2021   Jasmine Werner 1956-06-24 [id number]   Outgoing call placed to member, state she is doing better emotionally and physically.  Working with CSW for counseling.  PET scan reviewed, state she will have follow up scan in a few months.  Denies any urgent concerns, encouraged to contact this care manager with questions.    Encounter Medications:  Outpatient Encounter Medications as of 07/26/2021  Medication Sig Note   ASPIRIN 81 PO Take 81 mg by mouth at bedtime. (Patient not taking: Reported on 04/11/2021)    budesonide-formoterol (SYMBICORT) 160-4.5 MCG/ACT inhaler Inhale 2 puffs into the lungs 2 (two) times daily.    divalproex (DEPAKOTE) 250 MG DR tablet Take 1 tablet (250 mg total) by mouth 2 (two) times daily. For mood stabilization (Patient not taking: No sig reported)    escitalopram (LEXAPRO) 10 MG tablet Start 5 mg daily for one week, then 10 mg daily (Patient not taking: No sig reported)    fluticasone (FLOVENT HFA) 44 MCG/ACT inhaler Inhale 2 puffs into the lungs 2 (two) times daily. For shortness of breath (Patient not taking: Reported on 04/11/2021)    fluvoxaMINE (LUVOX) 100 MG tablet Take 100 mg by mouth at bedtime.    gabapentin (NEURONTIN) 600 MG tablet Take 1 tablet (600 mg total) by mouth at bedtime. For agitation 04/11/2021: Taking 800 mg at bedtime   ipratropium-albuterol (DUONEB) 0.5-2.5 (3) MG/3ML SOLN Inhale 3 mLs into the lungs every 6 (six) hours as needed (Shortness of Breath).    lisinopril (ZESTRIL) 10 MG tablet Take 1 tablet (10 mg total) by mouth daily.    melatonin 10 MG TABS Take 10 mg by mouth at bedtime. For sleep    metFORMIN (GLUCOPHAGE) 500 MG tablet Take 1,000 mg by mouth 2 (two) times daily with a meal.    polyethylene glycol-electrolytes (TRILYTE) 420 g solution Take 4,000 mLs by mouth as directed. (Patient not taking: No sig reported)    risperiDONE (RISPERDAL) 1 MG tablet Take  1 mg by mouth at bedtime.    rosuvastatin (CRESTOR) 5 MG tablet Take 5 mg by mouth daily.    tiZANidine (ZANAFLEX) 4 MG tablet Take 4 mg by mouth at bedtime as needed for muscle spasms.    VENTOLIN HFA 108 (90 BASE) MCG/ACT inhaler Inhale 1 puff into the lungs every 6 (six) hours as needed for shortness of breath.     No facility-administered encounter medications on file as of 07/26/2021.    Functional Status:  No flowsheet data found.  Fall/Depression Screening: Fall Risk  04/11/2021  Falls in the past year? 0  Number falls in past yr: 0  Injury with Fall? 0   PHQ 2/9 Scores 04/11/2021  PHQ - 2 Score 6  PHQ- 9 Score 27  Some encounter information is confidential and restricted. Go to Review Flowsheets activity to see all data.    Assessment:   Care Plan Care Plan : Diabetes Type 2 (Adult)  Updates made by Valente David, RN since 07/28/2021 12:00 AM     Problem: Glycemic Management (Diabetes, Type 2)      Long-Range Goal: Glycemic Management Optimized Completed 07/26/2021  Start Date: 04/11/2021  Expected End Date: 07/11/2021  Recent Progress: On track  Note:   Evidence-based guidance:  Anticipate A1C testing (point-of-care) every 3 to 6 months based on goal attainment.  Review mutually-set A1C goal or target range.  Anticipate use of antihyperglycemic with or without insulin  and periodic adjustments; consider active involvement of pharmacist.  Provide medical nutrition therapy and development of individualized eating.  Compare self-reported symptoms of hypo or hyperglycemia to blood glucose levels, diet and fluid intake, current medications, psychosocial and physiologic stressors, change in activity and barriers to care adherence.  Promote self-monitoring of blood glucose levels.  Assess and address barriers to management plan, such as food insecurity, age, developmental ability, depression, anxiety, fear of hypoglycemia or weight gain, as well as medication cost, side effects  and complicated regimen.  Consider referral to community-based diabetes education program, visiting nurse, community health worker or health coach.  Encourage regular dental care for treatment of periodontal disease; refer to dental provider when needed.   Notes:     Task: Alleviate Barriers to Glycemic Management Completed 07/28/2021  Due Date: 07/11/2021  Note:   Care Management Activities:    - blood glucose monitoring encouraged - mutual A1C goal set or reviewed    Notes:       Goals Addressed             This Visit's Progress    [facility] - Monitor and Manage My Blood Sugar-Diabetes Type 2   On track    Timeframe:  Short-Term Goal Priority:  High Start Date:           5/11                  Expected End Date:       9/11        Barriers: Knowledge Psychosocial    - enter blood sugar readings and medication or insulin into daily log - take the blood sugar log to all doctor visits    Why is this important?   Checking your blood sugar at home helps to keep it from getting very high or very low.  Writing the results in a diary or log helps the doctor know how to care for you.  Your blood sugar log should have the time, date and the results.  Also, write down the amount of insulin or other medicine that you take.  Other information, like what you ate, exercise done and how you were feeling, will also be helpful.     Notes:   5/11 - Encouraged to ask PCP for prescription for glucose meter  6/27 - Has started monitoring blood sugars, range 70's-mid 100s.  Highest of 200 once after meal.  7/20 - Encouraged to continue daily monitoring as well as diet management in effort to keep blood sugars controlled  8/25 - Report taking Metformin as instructed.  Blood sugars range 80-110.  Continues to work to improve diet     [facility] - Set My Target A1C-Diabetes Type 2   On track    Timeframe:  Long-Range Goal Priority:  Medium Start Date:            5/11                 Expected End  Date:   8/11                    Follow Up Date 5/26  Barriers: Knowledge - newly diagnosed    - set target A1C - 7   Why is this important?   Your target A1C is decided together by you and your doctor.  It is based on several things like your age and other health issues.    Notes:   5/11 - EMMI diabetes  education assigned, book mailed  6/27 - Confirms she has received education and reading material.    7/20 - Agrees to contact this care manager with questions regarding DM management  8/25 - Has follow up with PCP on 9/20, will have A1C drawn at that time        Plan:  Follow-up: Patient agrees to Care Plan and Follow-up. Follow-up in 1 month(s).  Valente David, [place], MSN [place] [phone]

## 2021-08-23 ENCOUNTER — Ambulatory Visit: Payer: Self-pay | Admitting: *Deleted

## 2021-08-23 ENCOUNTER — Other Ambulatory Visit: Payer: Self-pay | Admitting: *Deleted

## 2021-08-23 NOTE — Patient Outreach (Signed)
[place] [facility]) Care Management  08/23/2021  Jasmine Werner March 07, 1956 [id number]   Outgoing call placed to member, state she feels she is in a better place physically and mentally.  Denies any urgent concerns, encouraged to contact this care manager with questions.  Agrees to follow up within the next month.   Goals Addressed             This Visit's Progress    [facility]N - Monitor and Manage My Blood Sugar-Diabetes Type 2   On track    Timeframe:  Short-Term Goal Priority:  High Start Date:           9/22                 Expected End Date:       11/22 (date reset)  Barriers: Knowledge Psychosocial    - enter blood sugar readings and medication or insulin into daily log - take the blood sugar log to all doctor visits    Why is this important?   Checking your blood sugar at home helps to keep it from getting very high or very low.  Writing the results in a diary or log helps the doctor know how to care for you.  Your blood sugar log should have the time, date and the results.  Also, write down the amount of insulin or other medicine that you take.  Other information, like what you ate, exercise done and how you were feeling, will also be helpful.     Notes:   5/11 - Encouraged to ask PCP for prescription for glucose meter  6/27 - Has started monitoring blood sugars, range 70's-mid 100s.  Highest of 200 once after meal.  7/20 - Encouraged to continue daily monitoring as well as diet management in effort to keep blood sugars controlled  8/25 - Report taking Metformin as instructed.  Blood sugars range 80-110.  Continues to work to improve diet  9/22 - Not consistent with monitoring blood sugars, not documenting them, unable to report numbers.  She has been taking Metformin, was also placed on new medication on 9/20 after visit with PCP, not picked up from pharmacy yet.  Advised of importance of taking medications as prescribed in effort to manage condition      [facility] - Set My Target A1C-Diabetes Type 2   On track    Timeframe:  Long-Range Goal Priority:  Medium Start Date:            9/22            Expected End Date:   12/22 (goal reset)                Barriers: Knowledge     - set target A1C - 7   Why is this important?   Your target A1C is decided together by you and your doctor.  It is based on several things like your age and other health issues.    Notes:   5/11 - EMMI diabetes education assigned, book mailed  6/27 - Confirms she has received education and reading material.    7/20 - Agrees to contact this care manager with questions regarding DM management  8/25 - Has follow up with PCP on 9/20, will have A1C drawn at that time  9/22 - A1C decreased from 8.5 to 8.0, still struggling to manage diabetes, particularly diet (had cereal and pork skins today).  State there are times where she is too tired to  cook, eating out a lot.  Will look into [employer], that serve cooked meals Monday-Thursday for lunch.  She will notify this RNCM if she would like to pursue other options.  Looking forward to going to see her brother and family in [place] next week, will be gone for 3 week, will likely have better control of her diet while with family.       Valente David, [place], MSN [place] [phone]

## 2021-08-24 ENCOUNTER — Ambulatory Visit: Payer: Self-pay | Admitting: *Deleted

## 2021-08-25 ENCOUNTER — Telehealth: Payer: Self-pay | Admitting: *Deleted

## 2021-08-25 NOTE — Patient Outreach (Signed)
[facility]) Care Management  08/25/2021  Geniva Lohnes 02/03/1956 [id number]   CSW made an attempt to contact patient today to follow up was not successful.      CSW will await callback or try again in the next 2 weeks.     Eduard Clos, MSW, [place] Worker  [place] [phone]

## 2021-09-05 ENCOUNTER — Telehealth: Payer: Self-pay | Admitting: *Deleted

## 2021-09-05 ENCOUNTER — Ambulatory Visit: Payer: Self-pay | Admitting: *Deleted

## 2021-09-05 NOTE — Patient Outreach (Signed)
[facility]) Care Management  09/05/2021  Jasmine Werner 03-10-1956 [id number]  CSW  attempted a follow up call to pt today and was unsuccessful.  A HIPAA compliant message was left for patient on voicemail. CSW will try again in 2-3 business days if no return call is received.   Eduard Clos, MSW, [place] Worker  [place] [phone]

## 2021-09-06 ENCOUNTER — Telehealth: Payer: Self-pay | Admitting: *Deleted

## 2021-09-06 NOTE — Patient Outreach (Signed)
[place]) Care Management  09/06/2021  Delisha Peaden 1956-05-10 [id number]   CSW made attempt to follow up with pt by phone without success.  A HIPAA compliant message was left for patient on voicemail. CSW will try again in 2-3 business days if no return call is received.     Eduard Clos, MSW, [place] Worker  [place] [phone]

## 2021-09-07 ENCOUNTER — Ambulatory Visit: Payer: Self-pay | Admitting: *Deleted

## 2021-09-14 ENCOUNTER — Emergency Department
Admission: EM | Admit: 2021-09-14 | Discharge: 2021-09-14 | Discharge status: Against Medical Advice | Payer: Medicare (Managed Care) | Attending: Emergency Medicine | Admitting: Emergency Medicine

## 2021-09-14 DIAGNOSIS — R0602 Shortness of breath: Secondary | ICD-10-CM | POA: Insufficient documentation

## 2021-09-14 DIAGNOSIS — J441 Chronic obstructive pulmonary disease with (acute) exacerbation: Secondary | ICD-10-CM | POA: Insufficient documentation

## 2021-09-14 DIAGNOSIS — Z5329 Procedure and treatment not carried out because of patient's decision for other reasons: Secondary | ICD-10-CM | POA: Insufficient documentation

## 2021-09-14 DIAGNOSIS — E785 Hyperlipidemia, unspecified: Secondary | ICD-10-CM | POA: Insufficient documentation

## 2021-09-14 DIAGNOSIS — E119 Type 2 diabetes mellitus without complications: Secondary | ICD-10-CM | POA: Insufficient documentation

## 2021-09-14 DIAGNOSIS — R791 Abnormal coagulation profile: Secondary | ICD-10-CM | POA: Insufficient documentation

## 2021-09-14 LAB — PROCALCITONIN: Procalcitonin: 0.04 ng/mL (ref 0.00–0.24)

## 2021-09-14 LAB — CBC AND DIFFERENTIAL
Basophils %: 0.3 % (ref 0.0–3.0)
Basophils Absolute: 0 10*3/uL (ref 0.0–0.3)
Eosinophils %: 0.1 % (ref 0.0–7.0)
Eosinophils Absolute: 0 10*3/uL (ref 0.0–0.8)
Hematocrit: 48.4 % — ABNORMAL HIGH (ref 36.0–48.0)
Hemoglobin: 14.5 gm/dL (ref 12.0–16.0)
Lymphocytes Absolute: 1.7 10*3/uL (ref 0.6–5.1)
Lymphocytes: 11.8 % — ABNORMAL LOW (ref 15.0–46.0)
MCH: 29 pg (ref 28–35)
MCHC: 30 gm/dL — ABNORMAL LOW (ref 31–36)
MCV: 97 fL (ref 80–100)
MPV: 7.9 fL (ref 6.0–10.0)
Monocytes Absolute: 0.6 10*3/uL (ref 0.1–1.7)
Monocytes: 4.1 % (ref 3.0–15.0)
Neutrophils %: 83.8 % — ABNORMAL HIGH (ref 42.0–78.0)
Neutrophils Absolute: 11.7 10*3/uL — ABNORMAL HIGH (ref 1.7–8.6)
PLT CT: 274 10*3/uL (ref 130–440)
RBC: 4.96 10*6/uL (ref 3.80–5.00)
RDW: 15.1 % — ABNORMAL HIGH (ref 10.5–14.5)
WBC: 14 10*3/uL — ABNORMAL HIGH (ref 4.0–11.0)

## 2021-09-14 LAB — COMPREHENSIVE METABOLIC PANEL
ALT: 122 U/L — ABNORMAL HIGH (ref 0–55)
AST (SGOT): 43 U/L — ABNORMAL HIGH (ref 10–42)
Albumin/Globulin Ratio: 1.06 Ratio (ref 0.80–2.00)
Albumin: 3.5 gm/dL (ref 3.5–5.0)
Alkaline Phosphatase: 78 U/L (ref 40–145)
Anion Gap: 13.3 mMol/L (ref 7.0–18.0)
BUN / Creatinine Ratio: 16.2 Ratio (ref 10.0–30.0)
BUN: 12 mg/dL (ref 7–22)
Bilirubin, Total: 0.3 mg/dL (ref 0.1–1.2)
CO2: 31.2 mMol/L — ABNORMAL HIGH (ref 20.0–30.0)
Calcium: 8.3 mg/dL — ABNORMAL LOW (ref 8.5–10.5)
Chloride: 97 mMol/L — ABNORMAL LOW (ref 98–110)
Creatinine: 0.74 mg/dL (ref 0.60–1.20)
EGFR: 91 mL/min/{1.73_m2} (ref 60–150)
Globulin: 3.3 gm/dL (ref 2.0–4.0)
Glucose: 223 mg/dL — ABNORMAL HIGH (ref 71–99)
Osmolality Calculated: 280 mOsm/kg (ref 275–300)
Potassium: 4.5 mMol/L (ref 3.5–5.3)
Protein, Total: 6.8 gm/dL (ref 6.0–8.3)
Sodium: 137 mMol/L (ref 136–147)

## 2021-09-14 LAB — D-DIMER, QUANTITATIVE: D-Dimer: 0.69 mg/L FEU — ABNORMAL HIGH (ref 0.19–0.52)

## 2021-09-14 LAB — B-TYPE NATRIURETIC PEPTIDE: B-Natriuretic Peptide: 19.9 pg/mL (ref 0.0–100.0)

## 2021-09-14 MED ORDER — ALBUTEROL SULFATE (2.5 MG/3ML) 0.083% IN NEBU
10.0000 mg | INHALATION_SOLUTION | Freq: Once | RESPIRATORY_TRACT | Status: AC
Start: 2021-09-14 — End: 2021-09-14
  Administered 2021-09-14: 20:00:00 10 mg

## 2021-09-14 MED ORDER — ALBUTEROL SULFATE (2.5 MG/3ML) 0.083% IN NEBU
2.5000 mg | INHALATION_SOLUTION | Freq: Once | RESPIRATORY_TRACT | Status: DC
Start: 2021-09-14 — End: 2021-09-14

## 2021-09-14 MED ORDER — ALBUTEROL SULFATE (2.5 MG/3ML) 0.083% IN NEBU
5.0000 mg | INHALATION_SOLUTION | Freq: Four times a day (QID) | RESPIRATORY_TRACT | Status: DC | PRN
Start: 2021-09-14 — End: 2021-09-14

## 2021-09-14 MED ORDER — PREDNISONE 20 MG PO TABS
40.0000 mg | ORAL_TABLET | Freq: Every day | ORAL | 0 refills | Status: DC
Start: 2021-09-14 — End: 2021-09-20

## 2021-09-14 MED ORDER — ALBUTEROL-IPRATROPIUM 2.5-0.5 (3) MG/3ML IN SOLN
RESPIRATORY_TRACT | Status: AC
Start: 2021-09-14 — End: ?
  Filled 2021-09-14: qty 3

## 2021-09-14 MED ORDER — ALBUTEROL SULFATE (2.5 MG/3ML) 0.083% IN NEBU
INHALATION_SOLUTION | RESPIRATORY_TRACT | Status: AC
Start: 2021-09-14 — End: ?
  Filled 2021-09-14: qty 6

## 2021-09-14 MED ORDER — ALBUTEROL-IPRATROPIUM 2.5-0.5 (3) MG/3ML IN SOLN
3.0000 mL | Freq: Four times a day (QID) | RESPIRATORY_TRACT | Status: DC
Start: 2021-09-14 — End: 2021-09-14

## 2021-09-14 MED ORDER — ALPRAZOLAM 0.25 MG PO TABS
0.2500 mg | ORAL_TABLET | Freq: Once | ORAL | Status: AC
Start: 2021-09-14 — End: 2021-09-14
  Administered 2021-09-14: 19:00:00 0.25 mg via ORAL

## 2021-09-14 MED ORDER — ALBUTEROL SULFATE (2.5 MG/3ML) 0.083% IN NEBU
INHALATION_SOLUTION | RESPIRATORY_TRACT | Status: AC
Start: 2021-09-14 — End: ?
  Filled 2021-09-14: qty 12

## 2021-09-14 MED ORDER — ALBUTEROL-IPRATROPIUM 2.5-0.5 (3) MG/3ML IN SOLN
3.0000 mL | RESPIRATORY_TRACT | 0 refills | Status: AC | PRN
Start: 2021-09-14 — End: ?

## 2021-09-14 MED ORDER — ALBUTEROL-IPRATROPIUM 2.5-0.5 (3) MG/3ML IN SOLN
3.0000 mL | Freq: Once | RESPIRATORY_TRACT | Status: DC
Start: 2021-09-14 — End: 2021-09-14

## 2021-09-14 MED ORDER — ALBUTEROL-IPRATROPIUM 2.5-0.5 (3) MG/3ML IN SOLN
3.0000 mL | Freq: Once | RESPIRATORY_TRACT | Status: AC
Start: 2021-09-14 — End: 2021-09-14
  Administered 2021-09-14: 19:00:00 3 mL via RESPIRATORY_TRACT

## 2021-09-14 MED ORDER — ALBUTEROL SULFATE (2.5 MG/3ML) 0.083% IN NEBU
5.0000 mg | INHALATION_SOLUTION | Freq: Four times a day (QID) | RESPIRATORY_TRACT | Status: DC | PRN
Start: 2021-09-14 — End: 2021-09-14
  Administered 2021-09-14: 19:00:00 2.5 mg via RESPIRATORY_TRACT

## 2021-09-14 MED ORDER — METHYLPREDNISOLONE SODIUM SUCC 125 MG IJ SOLR
125.0000 mg | Freq: Once | INTRAMUSCULAR | Status: AC
Start: 2021-09-14 — End: 2021-09-14
  Administered 2021-09-14: 19:00:00 125 mg via INTRAVENOUS

## 2021-09-14 NOTE — ED Notes (Signed)
Patient requesting prescriptions and demanding to leave. Dr. Mellody Dance notified.

## 2021-09-14 NOTE — ED Notes (Signed)
Patient leaving AMA, reviewed AMA papers, witnessed signature. Charge nurse notified.

## 2021-09-14 NOTE — Discharge Instructions (Signed)
Today your D-dimer is slightly elevated.  This could be a sign of a blood clot in your leg or lung.  If you decide to proceed with CAT scan to make sure you do not have a blood clot in your lungs you should return  Your oxygen saturation is requiring more oxygen than you usually do -you decide you were in agreement to be admitted you should return

## 2021-09-14 NOTE — ED Provider Notes (Signed)
History     Chief Complaint   Patient presents with    Shortness of Breath     This patient has a history of COPD.  She continues to smoke intermittently.  She uses DuoNeb nebulizers every 4 hours as needed.  She wears oxygen 2 L at night.  She does not require chronic steroids.  She has never required mechanical ventilation.    This patient is traveling cross-country from [place] with her brother.  Today when they got to the hotel she prepared to give herself a neb treatment because she was wheezing worse.  She could not find the medicine for her machine.  Her brother had walked off and she did not know where she he had gone.  She got very panicky.  911 was called.  Patient reports that her oxygen saturation went down to 84%.  She reports that EMS gave her a neb treatment and she is better.    Patient denies any recent cough fever.  No chest pain or leg swelling.    Patient has a history of anxiety.  Remotely took Klonopin.  Is not on any benzos at this time.    Patient reports she wheezes at baseline.  She reports that her normal room air sat is anywhere from 88% to 95%.          Past Medical History:   Diagnosis Date    Anxiety     Chronic obstructive pulmonary disease     Depression     Diabetes mellitus     Hyperlipidemia     Malignant neoplasm        History reviewed. No pertinent surgical history.    History reviewed. No pertinent family history.    Social  Social History     Tobacco Use    Smoking status: Every Day     Packs/day: 1.00     Types: Cigarettes    Smokeless tobacco: Never   Vaping Use    Vaping Use: Never used   Substance Use Topics    Alcohol use: Never    Drug use: Never       .     No Known Allergies    Home Medications       Med List Status: Complete Set By: [name] Squires, RN at 09/14/2021  6:05 PM              albuterol sulfate HFA (PROVENTIL) 108 (90 Base) MCG/ACT inhaler     Inhale 2 puffs into the lungs     gabapentin (NEURONTIN) 800 MG tablet     Take 800 mg by mouth 3 (three)  times daily     glipiZIDE (GLUCOTROL) 5 MG tablet     Take 5 mg by mouth 2 (two) times daily before meals     lisinopril (ZESTRIL) 20 MG tablet     Take 20 mg by mouth daily     melatonin 3 mg tablet     Take 3 mg by mouth     metFORMIN (GLUCOPHAGE) 500 MG tablet     Take 1,000 mg by mouth 2 (two) times daily with meals     risperiDONE (RisperDAL) 1 MG tablet     Take 1 mg by mouth 2 (two) times daily     traZODone (DESYREL) 50 MG tablet     Take 50 mg by mouth nightly             Review of Systems   Constitutional:  Negative for chills and fever.   HENT:  Negative for congestion and sore throat.    Respiratory:  Positive for shortness of breath and wheezing.    Cardiovascular:  Negative for chest pain, palpitations and leg swelling.   Gastrointestinal:  Negative for abdominal pain, diarrhea, nausea and vomiting.   Genitourinary:  Negative for dysuria.   Neurological:  Negative for dizziness, syncope, light-headedness and headaches.   Psychiatric/Behavioral:  The patient is nervous/anxious.    [name] other systems reviewed and are negative.    Physical Exam    BP: 116/64, Heart Rate: (!) 118, Temp: 98.2 F (36.8 C), Resp Rate: 16, SpO2: 94 %, Weight: 110.6 kg     Physical Exam  Vitals and nursing note reviewed.   Constitutional:       Appearance: She is not diaphoretic.   HENT:      Mouth/Throat:      Mouth: Mucous membranes are moist.      Pharynx: Oropharynx is clear.   Eyes:      Pupils: Pupils are equal, round, and reactive to light.   Cardiovascular:      Rate and Rhythm: Regular rhythm. Tachycardia present.   Pulmonary:      Effort: Tachypnea present.      Breath sounds: Wheezing present.      Comments: Patient is able to answer questions with full sentences but is dyspneic doing so.  She is not using accessory muscles.  Chest:      Chest wall: No tenderness.   Musculoskeletal:      Cervical back: Normal range of motion and neck supple.      Right lower leg: No tenderness. No edema.      Left lower leg: No  tenderness. No edema.   Lymphadenopathy:      Cervical: No cervical adenopathy.   Skin:     General: Skin is warm and dry.      Capillary Refill: Capillary refill takes less than 2 seconds.   Neurological:      Mental Status: She is alert.      GCS: GCS eye subscore is 4. GCS verbal subscore is 5. GCS motor subscore is 6.   Psychiatric:         Mood and Affect: Mood is anxious.         Behavior: Behavior is cooperative.         MDM and ED Course     ED Medication Orders (From admission, onward)      Start Ordered     Status Ordering Provider    09/14/21 2002 09/14/21 2001  albuterol (PROVENTIL) (2.5 MG/3ML) 0.083% 10 mg (Take Home Medication)  Once        Route: Other  Ordered Dose: 10 mg     Last MAR action: Meds to Go - ED Use Only [name] [name]    09/14/21 2000 09/14/21 1858    RT - Every 6 hours scheduled        Route: Nebulization  Ordered Dose: 3 mL     Discontinued [name] [name]    09/14/21 1900 09/14/21 1859  albuterol-ipratropium (DUO-NEB) 2.5-0.5(3) mg/3 mL nebulizer 3 mL  RT - Once        Route: Nebulization  Ordered Dose: 3 mL     Last MAR action: Given [name] [name]    09/14/21 1859 09/14/21 1900    RT - Every 6 hours as needed        Route: Nebulization  Ordered Dose: 5 mg     Discontinued [name] [name]    09/14/21 1857 09/14/21 1857    RT - Every 6 hours as needed        Route: Nebulization  Ordered Dose: 5 mg     Discontinued [name] [name]    09/14/21 1843 09/14/21 1842    RT - Once        Route: Nebulization  Ordered Dose: 3 mL     Discontinued [name] [name]    09/14/21 1843 09/14/21 1842    RT - Once        Route: Nebulization  Ordered Dose: 2.5 mg     Discontinued [name] [name]    09/14/21 1843 09/14/21 1842  methylPREDNISolone sodium succinate (Solu-MEDROL) injection 125 mg  Once in ED        Route: Intravenous  Ordered Dose: 125 mg     Last MAR action: Given [name] [name]    09/14/21 1843 09/14/21 1842  ALPRAZolam (XANAX) tablet 0.25 mg  Once in ED        Route:  Oral  Ordered Dose: 0.25 mg     Last MAR action: Given [name] [name]             EKG shows sinus tachycardia rate 112.  There are no ischemic ST changes.  The QTC is 469  MDM    Radiology Results (24 Hour)       ** No results found for the last 24 hours. **           Results       Procedure Component Value Units Date/Time    Procalcitonin [[id number]] Collected: 09/14/21 1801    Specimen: Plasma Updated: 09/14/21 1928     Procalcitonin 0.04 ng/mL     B-type Natriuretic Peptide [[id number]] Collected: 09/14/21 1801    Specimen: Plasma Updated: 09/14/21 1928     B-Natriuretic Peptide 19.9 pg/mL     Comprehensive metabolic panel [[id number]]  (Abnormal) Collected: 09/14/21 1801    Specimen: Plasma Updated: 09/14/21 1901     Sodium 137 mMol/L      Potassium 4.5 mMol/L      Chloride 97 mMol/L      CO2 31.2 mMol/L      Calcium 8.3 mg/dL      Glucose 295 mg/dL      Creatinine 2.84 mg/dL      BUN 12 mg/dL      Protein, Total 6.8 gm/dL      Albumin 3.5 gm/dL      Alkaline Phosphatase 78 U/L      ALT 122 U/L      AST (SGOT) 43 U/L      Bilirubin, Total 0.3 mg/dL      Albumin/Globulin Ratio 1.06 Ratio      Anion Gap 13.3 mMol/L      BUN / Creatinine Ratio 16.2 Ratio      EGFR 91 mL/min/1.90m2      Osmolality Calculated 280 mOsm/kg      Globulin 3.3 gm/dL     [name]-dimer, quantitative [[id number]]  (Abnormal) Collected: 09/14/21 1801    Specimen: Blood Updated: 09/14/21 1856     [name]-Dimer 0.69 mg/L FEU     CBC and differential [[id number]]  (Abnormal) Collected: 09/14/21 1801    Specimen: Blood Updated: 09/14/21 1853     WBC 14.0 K/cmm      RBC 4.96 M/cmm      Hemoglobin 14.5 gm/dL  Hematocrit 48.4 %      MCV 97 fL      MCH 29 pg      MCHC 30 gm/dL      RDW 09.8 %      PLT CT 274 K/cmm      MPV 7.9 fL      Neutrophils % 83.8 %      Lymphocytes 11.8 %      Monocytes 4.1 %      Eosinophils % 0.1 %      Basophils % 0.3 %      Neutrophils Absolute 11.7 K/cmm      Lymphocytes Absolute 1.7 K/cmm      Monocytes Absolute 0.6 K/cmm       Eosinophils Absolute 0.0 K/cmm      Basophils Absolute 0.0 K/cmm                     1956-patient is demanding to go home.  I told her that her white blood cell count up.  I told her that her [name]-dimer is up and I recommend a chest CT to rule out PE.  She understands that she has been riding in a car with increased risk of blood clots.  I have also pointed out that she is on oxygen and with a sat in the low 90s.  Patient states that God is good.  She is willing to sign out AMA.  Her only concern is that I give her medicine for tonight and a prescription since she is lost her neb treatments.    Procedures    Clinical Impression & Disposition     Clinical Impression  Final diagnoses:   COPD exacerbation        ED Disposition       ED Disposition   AMA    Condition   --    Date/Time   Fri Sep 14, 2021  8:04 PM    Comment   Date: 09/14/2021  Patient: [name] [name]  Admitted: 09/14/2021  5:55 PM  Attending Provider: Manning Charity, MD    [name] [name] or her authorized caregiver has made the decision for the patient to leave the emergency department against the advi ce of Dr Mellody Dance. She or her authorized caregiver has been informed and understands the inherent risks, including death, respiratory failure.  She or her authorized caregiver has decided to accept the responsibility for this decision. [name] [name] necessary parties have been advised that she may return for further evaluation or treatment. Her condition at time of discharge was guarded.  [name] [name] had current vital signs as follows:  BP (!) 105/93   Pulse (!) 116   Temp 98.2 F (36 .8 C) (Oral)   Resp 21   Ht 1.651 m   Wt 110.6 kg                   Discharge Medication List as of 09/14/2021  8:16 PM        START taking these medications    Details   albuterol-ipratropium (DUO-NEB) 2.5-0.5(3) mg/3 mL nebulizer Take 3 mLs by nebulization every 4 (four) hours as needed (wheezing - shortness of breath), Starting Fri 09/14/2021, E-Rx       predniSONE (DELTASONE) 20 MG tablet Take 2 tablets (40 mg total) by mouth daily for 5 days, Starting Fri 09/14/2021, Until Wed 09/19/2021, E-Rx            This note  was completed using dragon medical speech recognition software. Grammatical errors, random word insertions, pronoun errors, incorrect word insertion, misspellings  and incomplete sentences are occasional consequences of this technology due to software limitations. If there are questions or concerns about the content of this note or information contained within the body of this dictation they should be addressed with the provider for clarification.              Manning Charity, MD  09/15/21 [phone]

## 2021-09-14 NOTE — ED Triage Notes (Signed)
SOB

## 2021-09-15 LAB — ECG 12-LEAD
P Wave Axis: 62 deg
P-R Interval: 144 ms
Patient Age: 65 years
Q-T Interval(Corrected): 469 ms
Q-T Interval: 343 ms
QRS Axis: 45 deg
QRS Duration: 83 ms
T Axis: 64 years
Ventricular Rate: 112 //min

## 2021-09-16 ENCOUNTER — Emergency Department
Admission: EM | Admit: 2021-09-16 | Discharge: 2021-09-17 | Disposition: A | Discharge status: Other Acute Inpatient Hospital | Payer: Medicare (Managed Care) | Attending: Emergency Medicine | Admitting: Emergency Medicine

## 2021-09-16 DIAGNOSIS — J9602 Acute respiratory failure with hypercapnia: Secondary | ICD-10-CM | POA: Insufficient documentation

## 2021-09-16 DIAGNOSIS — R780 Finding of alcohol in blood: Secondary | ICD-10-CM | POA: Insufficient documentation

## 2021-09-16 DIAGNOSIS — E162 Hypoglycemia, unspecified: Secondary | ICD-10-CM

## 2021-09-16 DIAGNOSIS — R000 Tachycardia, unspecified: Secondary | ICD-10-CM | POA: Insufficient documentation

## 2021-09-16 DIAGNOSIS — R410 Disorientation, unspecified: Secondary | ICD-10-CM | POA: Insufficient documentation

## 2021-09-16 DIAGNOSIS — E11649 Type 2 diabetes mellitus with hypoglycemia without coma: Secondary | ICD-10-CM | POA: Insufficient documentation

## 2021-09-16 DIAGNOSIS — Z79899 Other long term (current) drug therapy: Secondary | ICD-10-CM | POA: Insufficient documentation

## 2021-09-16 DIAGNOSIS — J9601 Acute respiratory failure with hypoxia: Secondary | ICD-10-CM | POA: Insufficient documentation

## 2021-09-16 DIAGNOSIS — R8279 Other abnormal findings on microbiological examination of urine: Secondary | ICD-10-CM | POA: Insufficient documentation

## 2021-09-16 DIAGNOSIS — E119 Type 2 diabetes mellitus without complications: Secondary | ICD-10-CM | POA: Insufficient documentation

## 2021-09-16 DIAGNOSIS — R4182 Altered mental status, unspecified: Secondary | ICD-10-CM | POA: Insufficient documentation

## 2021-09-16 DIAGNOSIS — E785 Hyperlipidemia, unspecified: Secondary | ICD-10-CM | POA: Insufficient documentation

## 2021-09-16 DIAGNOSIS — J441 Chronic obstructive pulmonary disease with (acute) exacerbation: Secondary | ICD-10-CM | POA: Insufficient documentation

## 2021-09-16 DIAGNOSIS — F1210 Cannabis abuse, uncomplicated: Secondary | ICD-10-CM | POA: Insufficient documentation

## 2021-09-16 LAB — CBC AND DIFFERENTIAL
Basophils %: 0.3 % (ref 0.0–3.0)
Basophils Absolute: 0.1 10*3/uL (ref 0.0–0.3)
Eosinophils %: 0.3 % (ref 0.0–7.0)
Eosinophils Absolute: 0.1 10*3/uL (ref 0.0–0.8)
Hematocrit: 49 % — ABNORMAL HIGH (ref 36.0–48.0)
Hemoglobin: 15.7 gm/dL (ref 12.0–16.0)
Lymphocytes Absolute: 3.5 10*3/uL (ref 0.6–5.1)
Lymphocytes: 16.5 % (ref 15.0–46.0)
MCH: 30 pg (ref 28–35)
MCHC: 32 gm/dL (ref 31–36)
MCV: 94 fL (ref 80–100)
MPV: 7 fL (ref 6.0–10.0)
Monocytes Absolute: 1.2 10*3/uL (ref 0.1–1.7)
Monocytes: 5.8 % (ref 3.0–15.0)
Neutrophils %: 77.2 % (ref 42.0–78.0)
Neutrophils Absolute: 16.2 10*3/uL — ABNORMAL HIGH (ref 1.7–8.6)
PLT CT: 293 10*3/uL (ref 130–440)
RBC: 5.2 10*6/uL — ABNORMAL HIGH (ref 3.80–5.00)
RDW: 14.3 % (ref 10.5–14.5)
WBC: 21 10*3/uL — ABNORMAL HIGH (ref 4.0–11.0)

## 2021-09-16 LAB — VH URINE DRUG SCREEN - NO CONFIRMATION
Propoxyphene: NEGATIVE
Urine Amphetamine: NEGATIVE
Urine Barbiturates: NEGATIVE
Urine Benzodiazepines: NEGATIVE
Urine Buprenorphine: NEGATIVE
Urine Cannabinoids: POSITIVE — AB
Urine Cocaine: NEGATIVE
Urine Methadone Screen: NEGATIVE
Urine Methamphetamine: NEGATIVE
Urine Opiates: NEGATIVE
Urine Oxycodone: NEGATIVE
Urine Phencyclidine: NEGATIVE
Urine Tricyclics: NEGATIVE

## 2021-09-16 LAB — VH DEXTROSE STICK GLUCOSE: Glucose POCT: 56 mg/dL — ABNORMAL LOW (ref 71–99)

## 2021-09-16 MED ORDER — DEXTROSE 50 % IV SOLN
INTRAVENOUS | Status: AC
Start: 2021-09-16 — End: ?
  Filled 2021-09-16: qty 50

## 2021-09-16 MED ORDER — VH DEXTROSE 10 % IV BOLUS (ADULT)
250.0000 mL | INTRAVENOUS | Status: DC | PRN
Start: 2021-09-16 — End: 2021-09-16

## 2021-09-16 MED ORDER — DEXTROSE 50 % IV SOLN
50.0000 mL | Freq: Once | INTRAVENOUS | Status: AC
Start: 2021-09-16 — End: 2021-09-16
  Administered 2021-09-16: 50 mL via INTRAVENOUS

## 2021-09-16 MED ORDER — NALOXONE HCL 0.4 MG/ML IJ SOLN (WRAP)
INTRAMUSCULAR | Status: AC
Start: 2021-09-16 — End: ?
  Filled 2021-09-16: qty 1

## 2021-09-16 MED ORDER — NALOXONE HCL 0.4 MG/ML IJ SOLN (WRAP)
0.4000 mg | Freq: Once | INTRAMUSCULAR | Status: AC
Start: 2021-09-16 — End: 2021-09-17
  Administered 2021-09-17: 0.4 mg via INTRAVENOUS

## 2021-09-16 NOTE — ED Provider Notes (Signed)
History     Chief Complaint   Patient presents with    Altered Mental Status     65 years old female with past medical history of chronic obstructive pulmonary disease, depression, diabetes, hyperlipidemia and substance abuse brought in [place] a local hotel with her mental state due to suspected overdose.  Patient is obtunded and is unable to give any history.    The history is provided by the EMS personnel and a relative. The history is limited by the condition of the patient. No language interpreter was used.      Past Medical History:   Diagnosis Date    Anxiety     Chronic obstructive pulmonary disease     Depression     Diabetes mellitus     Hyperlipidemia     Malignant neoplasm        History reviewed. No pertinent surgical history.    History reviewed. No pertinent family history.    Social  Social History     Tobacco Use    Smoking status: Every Day     Packs/day: 1.00     Types: Cigarettes    Smokeless tobacco: Never   Vaping Use    Vaping Use: Never used   Substance Use Topics    Alcohol use: Never    Drug use: Never       .     No Known Allergies    Home Medications       Med List Status: In Progress Set By: Peter Congo, RN at 09/17/2021 12:08 AM              albuterol sulfate HFA (PROVENTIL) 108 (90 Base) MCG/ACT inhaler     Inhale 2 puffs into the lungs     albuterol-ipratropium (DUO-NEB) 2.5-0.5(3) mg/3 mL nebulizer     Take 3 mLs by nebulization every 4 (four) hours as needed (wheezing - shortness of breath)     aspirin EC 81 MG EC tablet     Take 81 mg by mouth daily     budesonide-formoterol (SYMBICORT) 160-4.5 MCG/ACT inhaler     Inhale 2 puffs into the lungs     fluticasone (FLOVENT HFA) 44 MCG/ACT inhaler     Inhale 1 puff into the lungs 2 (two) times daily     gabapentin (NEURONTIN) 800 MG tablet     Take 800 mg by mouth 3 (three) times daily     glipiZIDE (GLUCOTROL) 5 MG tablet     Take 5 mg by mouth 2 (two) times daily before meals     lisinopril (ZESTRIL) 20 MG tablet     Take 20 mg  by mouth daily     melatonin 3 mg tablet     Take 3 mg by mouth     metFORMIN (GLUCOPHAGE) 500 MG tablet     Take 1,000 mg by mouth 2 (two) times daily with meals     POLYETH GLYC-PROPYLENE GLYC NA     by Nasal route     predniSONE (DELTASONE) 20 MG tablet     Take 2 tablets (40 mg total) by mouth daily for 5 days     risperiDONE (RisperDAL) 1 MG tablet     Take 1 mg by mouth 2 (two) times daily     rosuvastatin (CRESTOR) 5 MG tablet     Take 5 mg by mouth daily     TiZANidine (ZANAFLEX) 4 MG capsule     Take 4 mg by mouth nightly  traZODone (DESYREL) 50 MG tablet     Take 50 mg by mouth nightly             Review of Systems   Unable to perform ROS: Acuity of condition     Physical Exam    BP: (!) 150/97, Heart Rate: (!) 104, Temp: 97.6 F (36.4 C), Resp Rate: (!) 24, SpO2: 98 %, Weight: 115.7 kg    Physical Exam  Vitals and nursing note reviewed.   Constitutional:       General: She is not in acute distress.     Appearance: She is well-developed. She is obese. She is ill-appearing.   HENT:      Head: Normocephalic and atraumatic.      Mouth/Throat:      Pharynx: No oropharyngeal exudate.   Eyes:      Conjunctiva/sclera: Conjunctivae normal.      Pupils: Pupils are equal, round, and reactive to light.   Neck:      Vascular: No JVD.   Cardiovascular:      Rate and Rhythm: Regular rhythm. Tachycardia present.   Pulmonary:      Effort: Pulmonary effort is normal. No respiratory distress.      Breath sounds: Normal breath sounds.   Abdominal:      General: Bowel sounds are normal. There is no distension.      Palpations: Abdomen is soft.      Tenderness: There is no abdominal tenderness. There is no guarding.   Musculoskeletal:         General: No tenderness or deformity.      Cervical back: Normal range of motion and neck supple.   Lymphadenopathy:      Cervical: No cervical adenopathy.   Skin:     General: Skin is warm and dry.   Neurological:      Mental Status: She is disoriented.      Cranial Nerves: No cranial  nerve deficit.      Comments: Patient was able to tell her name         MDM and ED Course     ED Medication Orders (From admission, onward)      Start Ordered     Status Ordering Provider    09/17/21 0322 09/17/21 0321  sodium chloride 0.9 % bolus 1,000 mL  Once in ED        Route: Intravenous  Ordered Dose: 1,000 mL     Last [facility] action: [employer], Galvin Aversa R    09/17/21 0311 09/17/21 0311  succinylcholine (ANECTINE) injection 150 mg  Once        Route: Intravenous  Ordered Dose: 150 mg     Last MAR action: Given Kaylea Mounsey R    09/17/21 0257 09/17/21 0256  etomidate (AMIDATE) injection 20 mg  Once        Route: Intravenous  Ordered Dose: 20 mg     Last MAR action: Given Ivann Trimarco R    09/17/21 0257 09/17/21 0256  propofol (DIPRIVAN) 10 mg/mL infusion  Continuous        Route: Intravenous  Ordered Dose: 1-50 mcg/kg/min     Last MAR action: Rate/Dose Change Kennethia Lynes R    09/17/21 0248 09/17/21 0247  methylPREDNISolone sodium succinate (Solu-MEDROL) injection 40 mg  Once in ED        Route: Intravenous  Ordered Dose: 40 mg     Last MAR action: Given Sammie Denner R    09/17/21 0248  09/17/21 0248  methylPREDNISolone sodium succinate (Solu-MEDROL) injection 125 mg  Once in ED        Route: Intravenous  Ordered Dose: 125 mg     Last MAR action: Given Traevon Meiring R    09/17/21 0247 09/17/21 0246    Once in ED        Route: Intravenous  Ordered Dose: 40 mg     Discontinued Yuli Lanigan R    09/17/21 0217 09/17/21 0216  albuterol-ipratropium (DUO-NEB) 2.5-0.5(3) mg/3 mL nebulizer 3 mL  RT - Once        Route: Nebulization  Ordered Dose: 3 mL     Acknowledged Sergei Delo R    09/16/21 2355 09/16/21 2354  naloxone (NARCAN) injection 0.4 mg  Once in ED        Route: Intravenous  Ordered Dose: 0.4 mg     Last MAR action: Given Aldrin Engelhard R    09/16/21 2343 09/16/21 2343    As needed        Route: Intravenous  Ordered Dose: 250 mL     Discontinued Latanja Lehenbauer R    09/16/21 2330 09/16/21 2348  dextrose 50 % bolus 50  mL  Once        Route: Intravenous  Ordered Dose: 50 mL     Last MAR action: Given Vitaly Wanat R           Twelve-lead EKG: Sinus tachycardia, rate 106/min, borderline low voltage extremity leads, no acute ST-T segment changes.    CT Head WO- (Rad read)    Result Date: 09/17/2021  1. Faint 8 mm high attenuation medially in the medial right temporal lobe is nonspecific. This could represent calcification or small focal area of hemorrhage. Continued follow-up is recommended. 2. Otherwise no acute changes are identified. Result was called by myself at 0104 hours ReadingStation:[facility]    XR Chest AP Portable    Result Date: 09/17/2021  1. Mild central vascular prominence otherwise no acute changes. ReadingStation:[facility]     Results       Procedure Component Value Units Date/Time    Urinalysis with Microscopic if Indicated [[id number]]  (Abnormal) Collected: 09/16/21 2340    Specimen: Urine, Random Updated: 09/17/21 0320     Color, UA Yellow     Clarity, UA Clear     Urine Specific Gravity 1.025     pH, Urine 6.0 pH      Protein, UR Trace mg/dL      Glucose, UA Negative mg/dL      Ketones UA Negative mg/dL      Bilirubin, UA Negative mg/dL      Blood, UA Trace mg/dL      Nitrite, UA Positive     Urobilinogen, UA 0.2 mg/dL      Leukocyte Esterase, UA Negative Leu/uL      UR Micro Performed     WBC, UA None Seen /hpf      RBC, UA None Seen /hpf      Bacteria, UA Rare /hpf      Squam Epithel, UA 1-5 /lpf     i-Stat G3 Arterial CartrIDge [[id number]]  (Abnormal) Collected: 09/17/21 0230    Specimen: Arterial Updated: 09/17/21 0232     pH, ISTAT 7.13     PCO2, ISTAT >118.0 mm Hg      PO2, ISTAT 92 mm Hg      BE, ISTAT       `Unable to calculate result due to analyte out of  the analytical measurement range     mMol/L     HCO3, ISTAT       `Unable to calculate result due to analyte out of the analytical measurement range     mMol/L     TCO2 I-Stat       `Unable to calculate result due to analyte out of the analytical  measurement range     mMol/L     O2 Sat, %, ISTAT       `Unable to calculate result due to analyte out of the analytical measurement range     %     Room Number I-Stat 14     i-STAT Allen's Test Pass     DELS, ISTAT Oxy Mask     i-STAT FIO2 50 %      i-STAT Liters Per Minute 7 L/min      Sample I-Stat Arterial     Site I-Stat R Radial     Operator, ISTAT Operator: [id number] LIMBERG MARIA    Dextrose Stick Glucose [[id number]] Collected: 09/17/21 0212    Specimen: Blood Updated: 09/17/21 0229     Glucose POCT 92 mg/dL     Dextrose Stick Glucose [[id number]] Collected: 09/17/21 0134    Specimen: Blood Updated: 09/17/21 0150     Glucose POCT 86 mg/dL     Dextrose Stick Glucose [[id number]] Collected: 09/17/21 0105    Specimen: Blood Updated: 09/17/21 0123     Glucose POCT 98 mg/dL     Dextrose Stick Glucose [[id number]]  (Abnormal) Collected: 09/17/21 0033    Specimen: Blood Updated: 09/17/21 0050     Glucose POCT 111 mg/dL     Dextrose Stick Glucose [[id number]]  (Abnormal) Collected: 09/17/21 0002    Specimen: Blood Updated: 09/17/21 0026     Glucose POCT 158 mg/dL     Troponin I [[id number]] Collected: 09/16/21 2330    Specimen: Plasma Updated: 09/17/21 0016     Troponin I <0.01 ng/mL     Acetaminophen Level [[id number]] Collected: 09/16/21 2330    Specimen: Plasma Updated: 09/17/21 0010     Acetaminophen Level <0.6 mcg/mL     Ethanol (Alcohol) Level [[id number]] Collected: 09/16/21 2330    Specimen: Plasma Updated: 09/17/21 0010     Alcohol <10 mg/dL     Comprehensive metabolic panel [[id number]]  (Abnormal) Collected: 09/16/21 2330    Specimen: Plasma Updated: 09/17/21 0010     Sodium 133 mMol/L      Potassium 4.6 mMol/L      Chloride 91 mMol/L      CO2 35.0 mMol/L      Calcium 8.4 mg/dL      Glucose 54 mg/dL      Creatinine 0.10 mg/dL      BUN 14 mg/dL      Protein, Total 7.0 gm/dL      Albumin 3.7 gm/dL      Alkaline Phosphatase 74 U/L      ALT 175 U/L      AST (SGOT) 54 U/L      Bilirubin, Total 0.4 mg/dL       Albumin/Globulin Ratio 1.12 Ratio      Anion Gap 11.6 mMol/L      BUN / Creatinine Ratio 22.2 Ratio      EGFR 100 mL/min/1.15m2      Osmolality Calculated 264 mOsm/kg      Globulin 3.3 gm/dL     Prothrombin time/INR [[id number]] Collected: 09/16/21 2330    Specimen: Blood Updated: 09/17/21 0004  PT 10.2 sec      PT INR 0.9    Lactic Acid [[id number]] Collected: 09/16/21 2330    Specimen: Blood Updated: 09/17/21 0004     Lactic Acid 0.9 mMol/L     Urine Drug Screen - No Confirmation [[id number]]  (Abnormal) Collected: 09/16/21 2330    Specimen: Urine, Random Updated: 09/17/21 0000     Urine Cannabinoids Presumptive Positive     Urine Phencyclidine Negative     Urine Cocaine Negative     Urine Methamphetamine Negative     Urine Opiates Negative     Urine Amphetamine Negative     Urine Benzodiazepines Negative     Urine Tricyclics Negative     Urine Methadone Screen Negative     Urine Barbiturates Negative     Urine Oxycodone Negative     Propoxyphene Negative     Urine Buprenorphine Negative    CBC and differential [[id number]]  (Abnormal) Collected: 09/16/21 2330    Specimen: Blood Updated: 09/16/21 2358     WBC 21.0 K/cmm      RBC 5.20 M/cmm      Hemoglobin 15.7 gm/dL      Hematocrit 72.5 %      MCV 94 fL      MCH 30 pg      MCHC 32 gm/dL      RDW 36.6 %      PLT CT 293 K/cmm      MPV 7.0 fL      Neutrophils % 77.2 %      Lymphocytes 16.5 %      Monocytes 5.8 %      Eosinophils % 0.3 %      Basophils % 0.3 %      Neutrophils Absolute 16.2 K/cmm      Lymphocytes Absolute 3.5 K/cmm      Monocytes Absolute 1.2 K/cmm      Eosinophils Absolute 0.1 K/cmm      Basophils Absolute 0.1 K/cmm     Dextrose Stick Glucose [[id number]]  (Abnormal) Collected: 09/16/21 2337    Specimen: Blood Updated: 09/16/21 2353     Glucose POCT 56 mg/dL     Blood Culture - Venipuncture [[id number]] Collected: 09/16/21 2330    Specimen: Blood from Venipuncture Updated: 09/16/21 2345              MDM    65 years old female with history of COPD and  substance abuse as well as diabetes presented to the emergency department with altered mental status.  She had very transient response to IV D50 when she was found to have a blood glucose of 56.  But soon after she became obtunded again.  At this juncture she was treated with IV Narcan with no response.  Her laboratory work-up revealed WBC count of 21,000 without any bandemia.  Her urine drug screen reveals cannabinoids.  Lactic acid 0.9.  PT PTT INR normal.  Electrolytes reveal sodium of 133 chloride 91 potassium 4.6 CO2 35 calcium 8.4 glucose 54 creatinine 0.63.  After receiving D50 intravenously patient's blood sugar is stabilized.  Her ABG reveals pH 7.13 and PCO2 more than 118.  PO2 was 92.  COVID-negative.    At this juncture patient remains obtunded.  I have intubated her and is started on ventilator.  With sedation with propofol patient's blood pressure dropped.  Etomidate was held and patient was given 25 mics of fentanyl.  IV bolus normal saline was started.    I have  consulted with intensivist Dr. Richardson Chiquito at [facility] who accepted the patient in transfer.    Critical Care Time (not including procedures): 90 minutes.   Due to the high risk of critical illness or multi-organ failure at initial presentation and/or during ED course.   System(s) at risk for compromise: circulatory, respiratory and CNS  Critical Diagnosis:   1. Acute respiratory failure with hypoxia and hypercapnia    2. Hypoglycemia    3. Acute exacerbation of chronic obstructive pulmonary disease (COPD)    4. Cannabis abuse      The patient was Hypoxic: yes   This does not including time spent performing other reported procedures or services.  Critical care time involved full attention to the patient's condition and included:   Review of nursing notes and/or old charts - Yes   Documentation time - Yes   Care, transfer of care, and discharge plans - Yes   Obtaining necessary history from family, EMS, nursing home staff and/or  treating physicians - Yes   Review of medications, allergies, and vital signs - Yes   Consultant collaboration on findings and treatment options - Yes   Ordering, interpreting, and reviewing diagnostic studies/tab tests - Yes         Procedures    Clinical Impression & Disposition     Clinical Impression  Final diagnoses:   Acute respiratory failure with hypoxia and hypercapnia   Hypoglycemia   Acute exacerbation of chronic obstructive pulmonary disease (COPD)   Cannabis abuse        ED Disposition       ED Disposition   [facility] Transfer to [facility]    Condition   --    Date/Time   Mon Sep 17, 2021  3:22 AM    Comment   --                New Prescriptions    No medications on file                   Reginia Naas, MD  09/17/21 [phone]

## 2021-09-17 ENCOUNTER — Inpatient Hospital Stay: Payer: Medicare (Managed Care)

## 2021-09-17 ENCOUNTER — Emergency Department: Payer: Medicare (Managed Care)

## 2021-09-17 ENCOUNTER — Inpatient Hospital Stay
Admission: EM | Admit: 2021-09-17 | Discharge: 2021-09-20 | DRG: 208 | Disposition: A | Discharge status: Home / Self Care | Payer: Medicare (Managed Care) | Source: Other Acute Inpatient Hospital | Attending: Student in an Organized Health Care Education/Training Program | Admitting: Student in an Organized Health Care Education/Training Program

## 2021-09-17 ENCOUNTER — Inpatient Hospital Stay: Payer: Self-pay

## 2021-09-17 DIAGNOSIS — J440 Chronic obstructive pulmonary disease with acute lower respiratory infection: Secondary | ICD-10-CM | POA: Diagnosis present

## 2021-09-17 DIAGNOSIS — T43211A Poisoning by selective serotonin and norepinephrine reuptake inhibitors, accidental (unintentional), initial encounter: Secondary | ICD-10-CM | POA: Diagnosis present

## 2021-09-17 DIAGNOSIS — J189 Pneumonia, unspecified organism: Secondary | ICD-10-CM | POA: Diagnosis present

## 2021-09-17 DIAGNOSIS — R001 Bradycardia, unspecified: Secondary | ICD-10-CM | POA: Diagnosis not present

## 2021-09-17 DIAGNOSIS — Z9981 Dependence on supplemental oxygen: Secondary | ICD-10-CM

## 2021-09-17 DIAGNOSIS — D72829 Elevated white blood cell count, unspecified: Secondary | ICD-10-CM | POA: Diagnosis present

## 2021-09-17 DIAGNOSIS — J9621 Acute and chronic respiratory failure with hypoxia: Principal | ICD-10-CM | POA: Diagnosis present

## 2021-09-17 DIAGNOSIS — F05 Delirium due to known physiological condition: Secondary | ICD-10-CM

## 2021-09-17 DIAGNOSIS — J9622 Acute and chronic respiratory failure with hypercapnia: Secondary | ICD-10-CM | POA: Diagnosis present

## 2021-09-17 DIAGNOSIS — E785 Hyperlipidemia, unspecified: Secondary | ICD-10-CM | POA: Diagnosis present

## 2021-09-17 DIAGNOSIS — I82409 Acute embolism and thrombosis of unspecified deep veins of unspecified lower extremity: Secondary | ICD-10-CM | POA: Diagnosis not present

## 2021-09-17 DIAGNOSIS — F17210 Nicotine dependence, cigarettes, uncomplicated: Secondary | ICD-10-CM | POA: Diagnosis present

## 2021-09-17 DIAGNOSIS — E162 Hypoglycemia, unspecified: Secondary | ICD-10-CM | POA: Diagnosis present

## 2021-09-17 DIAGNOSIS — Z6841 Body Mass Index (BMI) 40.0 and over, adult: Secondary | ICD-10-CM

## 2021-09-17 DIAGNOSIS — J441 Chronic obstructive pulmonary disease with (acute) exacerbation: Secondary | ICD-10-CM | POA: Diagnosis present

## 2021-09-17 DIAGNOSIS — F32A Depression, unspecified: Secondary | ICD-10-CM | POA: Diagnosis present

## 2021-09-17 DIAGNOSIS — I82622 Acute embolism and thrombosis of deep veins of left upper extremity: Secondary | ICD-10-CM

## 2021-09-17 DIAGNOSIS — F419 Anxiety disorder, unspecified: Secondary | ICD-10-CM | POA: Diagnosis present

## 2021-09-17 DIAGNOSIS — F1910 Other psychoactive substance abuse, uncomplicated: Secondary | ICD-10-CM | POA: Diagnosis present

## 2021-09-17 DIAGNOSIS — Z20822 Contact with and (suspected) exposure to covid-19: Secondary | ICD-10-CM

## 2021-09-17 DIAGNOSIS — J9601 Acute respiratory failure with hypoxia: Secondary | ICD-10-CM | POA: Insufficient documentation

## 2021-09-17 DIAGNOSIS — E119 Type 2 diabetes mellitus without complications: Secondary | ICD-10-CM | POA: Diagnosis present

## 2021-09-17 DIAGNOSIS — R4182 Altered mental status, unspecified: Secondary | ICD-10-CM | POA: Diagnosis present

## 2021-09-17 DIAGNOSIS — E11649 Type 2 diabetes mellitus with hypoglycemia without coma: Secondary | ICD-10-CM

## 2021-09-17 DIAGNOSIS — Z781 Physical restraint status: Secondary | ICD-10-CM

## 2021-09-17 LAB — I-STAT CG4 ARTERIAL CARTRIDGE
BE, ISTAT: 6 mMol/L
BE, ISTAT: 7 mMol/L
HCO3, ISTAT: 38.7 mMol/L — ABNORMAL HIGH (ref 20.0–29.0)
HCO3, ISTAT: 35.2 mMol/L — ABNORMAL HIGH (ref 20.0–29.0)
Lactic Acid I-Stat: 1 mMol/L (ref 0.5–1.9)
Lactic Acid I-Stat: 0.8 mMol/L (ref 0.5–1.9)
O2 Sat, %, ISTAT: 98 % (ref 96–100)
O2 Sat, %, ISTAT: 94 % — ABNORMAL LOW (ref 96–100)
PCO2, ISTAT: 93.5 mm Hg (ref 35.0–45.0)
PCO2, ISTAT: 62.9 mm Hg (ref 35.0–45.0)
PIP, ISTAT: 44
PIP, ISTAT: 38
PO2, ISTAT: 125 mm Hg — ABNORMAL HIGH (ref 75–100)
PO2, ISTAT: 75 mm Hg (ref 75–100)
Rate, ISTAT: 16
Rate, ISTAT: 22
Room Number I-Stat: 16
Room Number I-Stat: 3520
TCO2 I-Stat: 41 mMol/L — ABNORMAL HIGH (ref 24–29)
TCO2 I-Stat: 37 mMol/L — ABNORMAL HIGH (ref 24–29)
Tidal Volume I-Stat: 550
Tidal Volume I-Stat: 430
i-STAT FIO2: 40 %
i-STAT FIO2: 50 %
i-STAT Peep: 10
i-STAT Peep: 8
i-STAT Pressure Support: 0
i-STAT Pressure Support: 0
pH, ISTAT: 7.22 — ABNORMAL LOW (ref 7.35–7.45)
pH, ISTAT: 7.36 (ref 7.35–7.45)

## 2021-09-17 LAB — I-STAT G3 ARTERIAL CARTRIDGE
PCO2, ISTAT: >118 mm Hg (ref 35.0–45.0)
PO2, ISTAT: 92 mm Hg (ref 75–100)
Room Number I-Stat: 14
i-STAT FIO2: 50 %
i-STAT Liters Per Minute: 7 L/min
pH, ISTAT: 7.13 — CL (ref 7.35–7.45)

## 2021-09-17 LAB — VH URINALYSIS WITH MICROSCOPIC IF INDICATED
Bilirubin, UA: NEGATIVE mg/dL
Bilirubin, UA: NEGATIVE
Glucose, UA: NEGATIVE mg/dL
Glucose, UA: 50 mg/dL — AB
Ketones UA: NEGATIVE mg/dL
Ketones UA: 5 mg/dL
Leukocyte Esterase, UA: NEGATIVE Leu/uL
Leukocyte Esterase, UA: NEGATIVE Leu/uL
Nitrite, UA: POSITIVE — AB
Nitrite, UA: NEGATIVE
Protein, UR: 30 mg/dL — AB
RBC, UA: NONE SEEN /hpf (ref ?–23)
RBC, UA: 7 /hpf — ABNORMAL HIGH (ref 0–5)
Urine Specific Gravity: 1.025 (ref 1.001–1.040)
Urine Specific Gravity: 1.026 (ref 1.001–1.040)
Urobilinogen, UA: 0.2 mg/dL
Urobilinogen, UA: 2 mg/dL — AB
WBC, UA: NONE SEEN /hpf
WBC, UA: 16 /hpf — ABNORMAL HIGH (ref 0–4)
pH, Urine: 6 pH (ref 5.0–8.0)
pH, Urine: 6 pH (ref 5.0–8.0)

## 2021-09-17 LAB — BASIC METABOLIC PANEL
Anion Gap: 15.7 mMol/L (ref 7.0–18.0)
BUN / Creatinine Ratio: 22.7 Ratio (ref 10.0–30.0)
BUN: 15 mg/dL (ref 7–22)
CO2: 30 mMol/L (ref 20–30)
Calcium: 8.3 mg/dL — ABNORMAL LOW (ref 8.5–10.5)
Chloride: 92 mMol/L — ABNORMAL LOW (ref 98–110)
Creatinine: 0.66 mg/dL (ref 0.60–1.20)
EGFR: 99 mL/min/{1.73_m2} (ref 60–150)
Glucose: 125 mg/dL — ABNORMAL HIGH (ref 71–99)
Osmolality Calculated: 269 mOsm/kg — ABNORMAL LOW (ref 275–300)
Potassium: 4.7 mMol/L (ref 3.5–5.3)
Sodium: 133 mMol/L — ABNORMAL LOW (ref 136–147)

## 2021-09-17 LAB — VH XPERT XPRESS © COV-2/FLU/RSV PLUS
Does patient reside in a congregate care setting?: NEGATIVE
Influenza A RNA: NEGATIVE
Influenza B RNA: NEGATIVE
Is patient admitted to the intensive care unit?: NEGATIVE
Is patient employed in a healthcare setting?: NEGATIVE
Is the patient pregnant?: NEGATIVE
RSV RNA: NEGATIVE
SARS-CoV-2 RNA: NEGATIVE

## 2021-09-17 LAB — PT/INR
PT INR: 0.9 (ref 0.9–1.2)
PT: 10.2 s (ref 9.3–11.3)

## 2021-09-17 LAB — CALCIUM, IONIZED
Calcium, Ionized: 3.86 mg/dL — ABNORMAL LOW (ref 4.35–5.10)
Calcium, Ionized: 3.42 mg/dL — ABNORMAL LOW (ref 4.35–5.10)

## 2021-09-17 LAB — TROPONIN I
Troponin I: 0.01 ng/mL (ref 0.00–0.02)
Troponin I: 0.01 ng/mL (ref 0.00–0.02)
Troponin I: 0.02 ng/mL (ref 0.00–0.02)
Troponin I: <0.01 ng/mL (ref 0.00–0.02)

## 2021-09-17 LAB — COMPREHENSIVE METABOLIC PANEL
ALT: 175 U/L — ABNORMAL HIGH (ref 0–55)
AST (SGOT): 54 U/L — ABNORMAL HIGH (ref 10–42)
Albumin/Globulin Ratio: 1.12 Ratio (ref 0.80–2.00)
Albumin: 3.7 gm/dL (ref 3.5–5.0)
Alkaline Phosphatase: 74 U/L (ref 40–145)
Anion Gap: 11.6 mMol/L (ref 7.0–18.0)
BUN / Creatinine Ratio: 22.2 Ratio (ref 10.0–30.0)
BUN: 14 mg/dL (ref 7–22)
Bilirubin, Total: 0.4 mg/dL (ref 0.1–1.2)
CO2: 35 mMol/L — ABNORMAL HIGH (ref 20.0–30.0)
Calcium: 8.4 mg/dL — ABNORMAL LOW (ref 8.5–10.5)
Chloride: 91 mMol/L — ABNORMAL LOW (ref 98–110)
Creatinine: 0.63 mg/dL (ref 0.60–1.20)
EGFR: 100 mL/min/{1.73_m2} (ref 60–150)
Globulin: 3.3 gm/dL (ref 2.0–4.0)
Glucose: 54 mg/dL — ABNORMAL LOW (ref 71–99)
Osmolality Calculated: 264 mOsm/kg — ABNORMAL LOW (ref 275–300)
Potassium: 4.6 mMol/L (ref 3.5–5.3)
Protein, Total: 7 gm/dL (ref 6.0–8.3)
Sodium: 133 mMol/L — ABNORMAL LOW (ref 136–147)

## 2021-09-17 LAB — CBC AND DIFFERENTIAL
Basophils %: 0 % (ref 0.0–3.0)
Basophils Absolute: 0 10*3/uL (ref 0.0–0.3)
Eosinophils %: 0.1 % (ref 0.0–7.0)
Eosinophils Absolute: 0 10*3/uL (ref 0.0–0.8)
Hematocrit: 49.7 % — ABNORMAL HIGH (ref 36.0–48.0)
Hemoglobin: 15 gm/dL (ref 12.0–16.0)
Lymphocytes Absolute: 1.3 10*3/uL (ref 0.6–5.1)
Lymphocytes: 6.7 % — ABNORMAL LOW (ref 15.0–46.0)
MCH: 30 pg (ref 28–35)
MCHC: 30 gm/dL — ABNORMAL LOW (ref 32–36)
MCV: 100 fL (ref 80–100)
MPV: 7 fL (ref 6.0–10.0)
Monocytes Absolute: 0.5 10*3/uL (ref 0.1–1.7)
Monocytes: 2.5 % — ABNORMAL LOW (ref 3.0–15.0)
Neutrophils %: 90.6 % — ABNORMAL HIGH (ref 42.0–78.0)
Neutrophils Absolute: 17.3 10*3/uL — ABNORMAL HIGH (ref 1.7–8.6)
PLT CT: 264 10*3/uL (ref 130–440)
RBC: 4.97 10*6/uL (ref 3.80–5.00)
RDW: 13.9 % (ref 11.0–14.0)
WBC: 19.1 10*3/uL — ABNORMAL HIGH (ref 4.0–11.0)

## 2021-09-17 LAB — HEPATIC FUNCTION PANEL
ALT: 149 U/L — ABNORMAL HIGH (ref 0–55)
AST (SGOT): 51 U/L — ABNORMAL HIGH (ref 10–42)
Albumin/Globulin Ratio: 1.1 Ratio (ref 0.80–2.00)
Albumin: 3.3 gm/dL — ABNORMAL LOW (ref 3.5–5.0)
Alkaline Phosphatase: 76 U/L (ref 40–145)
Bilirubin Direct: 0.4 mg/dL — ABNORMAL HIGH (ref 0.0–0.3)
Bilirubin, Total: 0.6 mg/dL (ref 0.1–1.2)
Globulin: 3 gm/dL (ref 2.0–4.0)
Protein, Total: 6.3 gm/dL (ref 6.0–8.3)

## 2021-09-17 LAB — ETHANOL
Alcohol: <10 mg/dL (ref 0–9)
Alcohol: <10 mg/dL (ref 0–9)

## 2021-09-17 LAB — ACETAMINOPHEN LEVEL
Acetaminophen Level: <0.6 ug/mL (ref 0.0–30.0)
Acetaminophen Level: <0.6 ug/mL (ref 0.0–30.0)

## 2021-09-17 LAB — D-DIMER, QUANTITATIVE: D-Dimer: 0.91 mg/L FEU — ABNORMAL HIGH (ref 0.19–0.52)

## 2021-09-17 LAB — PHOSPHORUS: Phosphorus: 3.2 mg/dL (ref 2.3–4.7)

## 2021-09-17 LAB — SALICYLATE LEVEL: Salicylate Level: <5 mg/dL (ref 0.0–30.0)

## 2021-09-17 LAB — PROCALCITONIN: Procalcitonin: 0.06 ng/mL (ref 0.00–0.24)

## 2021-09-17 LAB — VH DEXTROSE STICK GLUCOSE
Glucose POCT: 111 mg/dL — ABNORMAL HIGH (ref 71–99)
Glucose POCT: 131 mg/dL — ABNORMAL HIGH (ref 71–99)
Glucose POCT: 141 mg/dL — ABNORMAL HIGH (ref 71–99)
Glucose POCT: 158 mg/dL — ABNORMAL HIGH (ref 71–99)
Glucose POCT: 167 mg/dL — ABNORMAL HIGH (ref 71–99)
Glucose POCT: 178 mg/dL — ABNORMAL HIGH (ref 71–99)
Glucose POCT: 86 mg/dL (ref 71–99)
Glucose POCT: 92 mg/dL (ref 71–99)
Glucose POCT: 98 mg/dL (ref 71–99)

## 2021-09-17 LAB — MAGNESIUM: Magnesium: 2.2 mg/dL (ref 1.6–2.6)

## 2021-09-17 LAB — VH LEGIONELLA ANTIGEN URINE: Urine Legionella Antigen Rapid: NOT DETECTED

## 2021-09-17 LAB — TSH: TSH: 2.03 u[IU]/mL (ref 0.40–4.20)

## 2021-09-17 LAB — VH MRSA DETECTION BY DNA AMPLIFICATION (NARES): MRSA: NOT DETECTED

## 2021-09-17 LAB — LACTIC ACID, PLASMA: Lactic Acid: 0.9 mMol/L (ref 0.5–1.9)

## 2021-09-17 LAB — VH STREP PNEUMONIAE URINE ANTIGEN: Urine, Strep pneumoniae Antigen: NOT DETECTED

## 2021-09-17 MED ORDER — VH NURSING CARE MEDICATION PROTOCOL PLACEHOLDER
1.0000 | Status: DC
Start: 2021-09-17 — End: 2021-09-19

## 2021-09-17 MED ORDER — FAMOTIDINE 20 MG PO TABS
20.0000 mg | ORAL_TABLET | Freq: Two times a day (BID) | ORAL | Status: DC
Start: 2021-09-17 — End: 2021-09-19
  Administered 2021-09-17 – 2021-09-18 (×2): 20 mg via NASOGASTRIC
  Filled 2021-09-17 (×2): qty 1

## 2021-09-17 MED ORDER — METHYLPREDNISOLONE SODIUM SUCC 125 MG IJ SOLR
INTRAMUSCULAR | Status: AC
Start: 2021-09-17 — End: ?
  Filled 2021-09-17: qty 2

## 2021-09-17 MED ORDER — VH DOXYCYCLINE 100 MG PO (WRAP)
100.0000 mg | ORAL_CAPSULE | Freq: Two times a day (BID) | ORAL | Status: DC
Start: 2021-09-17 — End: 2021-09-17

## 2021-09-17 MED ORDER — GABAPENTIN 250 MG/5ML PO SOLN
300.0000 mg | Freq: Three times a day (TID) | ORAL | Status: DC
Start: 2021-09-17 — End: 2021-09-17

## 2021-09-17 MED ORDER — FENTANYL CITRATE (PF) 50 MCG/ML IJ SOLN (WRAP)
INTRAMUSCULAR | Status: AC
Start: 2021-09-17 — End: ?
  Filled 2021-09-17: qty 2

## 2021-09-17 MED ORDER — FENTANYL CITRATE (PF) 50 MCG/ML IJ SOLN (WRAP)
25.0000 ug | Freq: Once | INTRAMUSCULAR | Status: AC
Start: 2021-09-17 — End: 2021-09-17
  Administered 2021-09-17: 03:00:00 25 ug via INTRAVENOUS

## 2021-09-17 MED ORDER — METHYLPREDNISOLONE SODIUM SUCC 40 MG IJ SOLR
40.0000 mg | Freq: Every day | INTRAMUSCULAR | Status: DC
Start: 2021-09-18 — End: 2021-09-17

## 2021-09-17 MED ORDER — VH ELECTROLYTE REPLACEMENT PROTOCOL PLACEHOLDER
Status: DC | PRN
Start: 2021-09-17 — End: 2021-09-19
  Filled 2021-09-17: qty 1

## 2021-09-17 MED ORDER — VH DEXMEDETOMIDINE HCL IN NACL 400 MCG/100ML (SIMPLE)
0.3000 ug/kg/h | INTRAVENOUS | Status: DC
Start: 2021-09-17 — End: 2021-09-18
  Administered 2021-09-17: 17:00:00 0.5 ug/kg/h via INTRAVENOUS
  Administered 2021-09-17: 11:00:00 0.3 ug/kg/h via INTRAVENOUS
  Administered 2021-09-18: 01:00:00 0.5 ug/kg/h via INTRAVENOUS
  Filled 2021-09-17 (×3): qty 100

## 2021-09-17 MED ORDER — VH BIO-K PLUS PROBIOTIC 50 BIL CFU CAPSULE
50.0000 | DELAYED_RELEASE_CAPSULE | Freq: Every day | ORAL | Status: DC
Start: 2021-09-17 — End: 2021-09-17

## 2021-09-17 MED ORDER — CALCIUM GLUCONATE-NACL 1-0.675 GM/50ML-% IV SOLN
1.0000 g | Freq: Once | INTRAVENOUS | Status: AC
Start: 2021-09-17 — End: 2021-09-17
  Administered 2021-09-17: 21:00:00 1 g via INTRAVENOUS
  Filled 2021-09-17: qty 50

## 2021-09-17 MED ORDER — ENOXAPARIN SODIUM 40 MG/0.4ML IJ SOSY
40.0000 mg | PREFILLED_SYRINGE | INTRAMUSCULAR | Status: DC
Start: 2021-09-17 — End: 2021-09-19
  Administered 2021-09-17 – 2021-09-19 (×3): 40 mg via SUBCUTANEOUS
  Filled 2021-09-17 (×3): qty 0.4

## 2021-09-17 MED ORDER — KETAMINE HCL 50 MG/ML IJ SOLN
INTRAMUSCULAR | Status: AC
Start: 2021-09-17 — End: ?
  Filled 2021-09-17: qty 10

## 2021-09-17 MED ORDER — SODIUM CHLORIDE (PF) 0.9 % IJ SOLN
20.0000 mg | Freq: Two times a day (BID) | INTRAVENOUS | Status: DC
Start: 2021-09-17 — End: 2021-09-17
  Administered 2021-09-17: 09:00:00 20 mg via INTRAVENOUS
  Filled 2021-09-17: qty 2

## 2021-09-17 MED ORDER — ACETAMINOPHEN 325 MG PO TABS
650.0000 mg | ORAL_TABLET | Freq: Four times a day (QID) | ORAL | Status: DC | PRN
Start: 2021-09-17 — End: 2021-09-18
  Administered 2021-09-17: 650 mg via ORAL
  Filled 2021-09-17: qty 2

## 2021-09-17 MED ORDER — ALBUTEROL SULFATE (2.5 MG/3ML) 0.083% IN NEBU
2.5000 mg | INHALATION_SOLUTION | Freq: Four times a day (QID) | RESPIRATORY_TRACT | Status: DC
Start: 2021-09-17 — End: 2021-09-18
  Administered 2021-09-17 – 2021-09-18 (×4): 2.5 mg via RESPIRATORY_TRACT
  Filled 2021-09-17 (×4): qty 3

## 2021-09-17 MED ORDER — RISPERIDONE 0.5 MG PO TABS
1.0000 mg | ORAL_TABLET | Freq: Two times a day (BID) | ORAL | Status: DC
Start: 2021-09-17 — End: 2021-09-17

## 2021-09-17 MED ORDER — METHYLPREDNISOLONE SODIUM SUCC 40 MG IJ SOLR
INTRAMUSCULAR | Status: AC
Start: 2021-09-17 — End: ?
  Filled 2021-09-17: qty 1

## 2021-09-17 MED ORDER — SODIUM CHLORIDE (PF) 0.9 % IJ SOLN
2.0000 g | INTRAMUSCULAR | Status: DC
Start: 2021-09-17 — End: 2021-09-19
  Administered 2021-09-17 – 2021-09-19 (×3): 2 g via INTRAVENOUS
  Filled 2021-09-17 (×3): qty 2000

## 2021-09-17 MED ORDER — SUCCINYLCHOLINE CHLORIDE 20 MG/ML IJ SOLN
INTRAMUSCULAR | Status: AC
Start: 2021-09-17 — End: ?
  Filled 2021-09-17: qty 10

## 2021-09-17 MED ORDER — KETAMINE HCL 50 MG/ML IJ SOLN
1.0000 mg/kg | Freq: Once | INTRAMUSCULAR | Status: AC
Start: 2021-09-17 — End: 2021-09-17
  Administered 2021-09-17: 04:00:00 111.5 mg via INTRAVENOUS

## 2021-09-17 MED ORDER — RISPERIDONE 0.5 MG PO TABS
1.0000 mg | ORAL_TABLET | Freq: Two times a day (BID) | ORAL | Status: DC
Start: 2021-09-17 — End: 2021-09-18
  Administered 2021-09-17: 17:00:00 1 mg via NASOGASTRIC
  Filled 2021-09-17: qty 2

## 2021-09-17 MED ORDER — FENTANYL CITRATE (PF) 50 MCG/ML IJ SOLN (WRAP)
50.0000 ug | INTRAMUSCULAR | Status: DC | PRN
Start: 2021-09-17 — End: 2021-09-19
  Administered 2021-09-17 – 2021-09-18 (×3): 50 ug via INTRAVENOUS
  Filled 2021-09-17 (×5): qty 2

## 2021-09-17 MED ORDER — GABAPENTIN 250 MG/5ML PO SOLN
300.0000 mg | Freq: Three times a day (TID) | ORAL | Status: DC
Start: 2021-09-17 — End: 2021-09-19
  Administered 2021-09-17 – 2021-09-18 (×3): 300 mg via NASOGASTRIC
  Filled 2021-09-17 (×3): qty 6

## 2021-09-17 MED ORDER — SODIUM CHLORIDE 0.9 % IV SOLN
INTRAVENOUS | Status: DC
Start: 2021-09-17 — End: 2021-09-19

## 2021-09-17 MED ORDER — METHYLPREDNISOLONE SODIUM SUCC 125 MG IJ SOLR
125.0000 mg | Freq: Once | INTRAMUSCULAR | Status: AC
Start: 2021-09-17 — End: 2021-09-17
  Administered 2021-09-17: 03:00:00 125 mg via INTRAVENOUS

## 2021-09-17 MED ORDER — METHYLPREDNISOLONE SODIUM SUCC 40 MG IJ SOLR
40.0000 mg | Freq: Every day | INTRAMUSCULAR | Status: DC
Start: 2021-09-17 — End: 2021-09-17

## 2021-09-17 MED ORDER — VH BIO-K PLUS 100 BIL LIQUID
1.0000 | Freq: Every day | ORAL | Status: DC
Start: 2021-09-17 — End: 2021-09-19
  Administered 2021-09-17 – 2021-09-18 (×2): 100 via NASOGASTRIC
  Filled 2021-09-17 (×2): qty 104

## 2021-09-17 MED ORDER — MIDAZOLAM HCL 1 MG/ML IJ SOLN (WRAP)
INTRAMUSCULAR | Status: AC | PRN
Start: 2021-09-17 — End: 2021-09-17
  Administered 2021-09-17: 1 mg via INTRAVENOUS

## 2021-09-17 MED ORDER — VECURONIUM BROMIDE 10 MG IV SOLR
INTRAVENOUS | Status: AC
Start: 2021-09-17 — End: ?
  Filled 2021-09-17: qty 10

## 2021-09-17 MED ORDER — ALBUTEROL-IPRATROPIUM 2.5-0.5 (3) MG/3ML IN SOLN
3.0000 mL | Freq: Once | RESPIRATORY_TRACT | Status: DC
Start: 2021-09-17 — End: 2021-09-17

## 2021-09-17 MED ORDER — SODIUM CHLORIDE 0.9 % IV SOLN
500.0000 mg | INTRAVENOUS | Status: DC
Start: 2021-09-17 — End: 2021-09-17
  Filled 2021-09-17: qty 500

## 2021-09-17 MED ORDER — ROSUVASTATIN CALCIUM 5 MG PO TABS
5.0000 mg | ORAL_TABLET | Freq: Every evening | ORAL | Status: DC
Start: 2021-09-17 — End: 2021-09-17

## 2021-09-17 MED ORDER — PROPOFOL 200 MG/20ML IV EMUL
INTRAVENOUS | Status: AC | PRN
Start: 2021-09-17 — End: 2021-09-17
  Administered 2021-09-17: 10 mg via INTRAVENOUS

## 2021-09-17 MED ORDER — CALCIUM GLUCONATE-NACL 2-0.675 GM/100ML-% IV SOLN
2.0000 g | Freq: Once | INTRAVENOUS | Status: AC
Start: 2021-09-17 — End: 2021-09-17
  Administered 2021-09-17: 13:00:00 2 g via INTRAVENOUS
  Filled 2021-09-17: qty 100

## 2021-09-17 MED ORDER — ROSUVASTATIN CALCIUM 5 MG PO TABS
5.0000 mg | ORAL_TABLET | Freq: Every evening | ORAL | Status: DC
Start: 2021-09-17 — End: 2021-09-19
  Administered 2021-09-17: 22:00:00 5 mg via NASOGASTRIC
  Filled 2021-09-17: qty 1

## 2021-09-17 MED ORDER — LORAZEPAM 2 MG/ML IJ SOLN
INTRAMUSCULAR | Status: AC
Start: 2021-09-17 — End: ?
  Filled 2021-09-17: qty 1

## 2021-09-17 MED ORDER — VH PROPOFOL INFUSION 10 MG/ML (WRAPPED)
1.0000 ug/kg/min | INTRAVENOUS | Status: DC
Start: 2021-09-17 — End: 2021-09-19
  Administered 2021-09-17: 06:00:00 50 ug/kg/min via INTRAVENOUS
  Administered 2021-09-17 (×2): 15 ug/kg/min via INTRAVENOUS
  Administered 2021-09-18: 06:00:00 20 ug/kg/min via INTRAVENOUS
  Administered 2021-09-18: 11:00:00 40 ug/kg/min via INTRAVENOUS
  Administered 2021-09-18: 15:00:00 15 ug/kg/min via INTRAVENOUS
  Filled 2021-09-17 (×5): qty 100

## 2021-09-17 MED ORDER — DOXYCYCLINE MONOHYDRATE 25 MG/5ML PO SUSR
100.0000 mg | Freq: Two times a day (BID) | ORAL | Status: DC
Start: 2021-09-17 — End: 2021-09-19
  Administered 2021-09-17 – 2021-09-18 (×3): 100 mg via NASOGASTRIC
  Filled 2021-09-17 (×5): qty 20

## 2021-09-17 MED ORDER — CALCIUM GLUCONATE-NACL 1-0.675 GM/50ML-% IV SOLN
1.0000 g | Freq: Once | INTRAVENOUS | Status: AC
Start: 2021-09-17 — End: 2021-09-17
  Administered 2021-09-17: 14:00:00 1 g via INTRAVENOUS
  Filled 2021-09-17: qty 50

## 2021-09-17 MED ORDER — PREDNISONE 20 MG PO TABS
40.0000 mg | ORAL_TABLET | Freq: Every day | ORAL | Status: DC
Start: 2021-09-18 — End: 2021-09-18

## 2021-09-17 MED ORDER — SODIUM CHLORIDE (PF) 0.9 % IJ SOLN
3.0000 mL | Freq: Two times a day (BID) | INTRAMUSCULAR | Status: DC
Start: 2021-09-17 — End: 2021-09-20
  Administered 2021-09-17 – 2021-09-20 (×7): 3 mL via INTRAVENOUS

## 2021-09-17 MED ORDER — VH SODIUM CHLORIDE 0.9 % IV BOLUS
1000.0000 mL | Freq: Once | INTRAVENOUS | Status: AC
Start: 2021-09-17 — End: 2021-09-17
  Administered 2021-09-17: 03:00:00 1000 mL via INTRAVENOUS

## 2021-09-17 MED ORDER — ETOMIDATE 2 MG/ML IV SOLN
20.0000 mg | Freq: Once | INTRAVENOUS | Status: AC
Start: 2021-09-17 — End: 2021-09-17
  Administered 2021-09-17: 03:00:00 20 mg via INTRAVENOUS

## 2021-09-17 MED ORDER — METHYLPREDNISOLONE SODIUM SUCC 40 MG IJ SOLR
40.0000 mg | Freq: Once | INTRAMUSCULAR | Status: DC
Start: 2021-09-17 — End: 2021-09-17

## 2021-09-17 MED ORDER — VECURONIUM BROMIDE 10 MG IV SOLR
10.0000 mg | Freq: Once | INTRAVENOUS | Status: AC
Start: 2021-09-17 — End: 2021-09-17
  Administered 2021-09-17: 05:00:00 10 mg via INTRAVENOUS

## 2021-09-17 MED ORDER — ETOMIDATE 2 MG/ML IV SOLN
INTRAVENOUS | Status: AC
Start: 2021-09-17 — End: ?
  Filled 2021-09-17: qty 10

## 2021-09-17 MED ORDER — CALCIUM GLUCONATE-NACL 2-0.675 GM/100ML-% IV SOLN
2.0000 g | Freq: Once | INTRAVENOUS | Status: AC
Start: 2021-09-17 — End: 2021-09-17
  Administered 2021-09-17: 20:00:00 2 g via INTRAVENOUS
  Filled 2021-09-17: qty 100

## 2021-09-17 MED ORDER — METHYLPREDNISOLONE SODIUM SUCC 40 MG IJ SOLR
40.0000 mg | Freq: Once | INTRAMUSCULAR | Status: AC
Start: 2021-09-17 — End: 2021-09-17
  Administered 2021-09-17: 03:00:00 40 mg via INTRAVENOUS

## 2021-09-17 MED ORDER — VH PROPOFOL INFUSION 10 MG/ML (WRAPPED)
1.0000 ug/kg/min | INTRAVENOUS | Status: DC
Start: 2021-09-17 — End: 2021-09-17
  Administered 2021-09-17: 03:00:00 10 ug/kg/min via INTRAVENOUS

## 2021-09-17 MED ORDER — STERILE WATER FOR INJECTION IJ SOLN
INTRAMUSCULAR | Status: AC
Start: 2021-09-17 — End: ?
  Filled 2021-09-17: qty 20

## 2021-09-17 MED ORDER — MIDAZOLAM HCL 1 MG/ML IJ SOLN (WRAP)
INTRAMUSCULAR | Status: AC
Start: 2021-09-17 — End: 2021-09-18
  Filled 2021-09-17: qty 2

## 2021-09-17 MED ORDER — SUCCINYLCHOLINE CHLORIDE 20 MG/ML IJ SOLN
150.0000 mg | Freq: Once | INTRAMUSCULAR | Status: AC
Start: 2021-09-17 — End: 2021-09-17
  Administered 2021-09-17: 03:00:00 150 mg via INTRAVENOUS

## 2021-09-17 MED ORDER — PROPOFOL 200 MG/20ML IV EMUL
INTRAVENOUS | Status: AC | PRN
Start: 2021-09-17 — End: 2021-09-17

## 2021-09-17 MED ORDER — PREDNISONE 20 MG PO TABS
40.0000 mg | ORAL_TABLET | Freq: Every day | ORAL | Status: DC
Start: 2021-09-17 — End: 2021-09-17

## 2021-09-17 MED ORDER — LORAZEPAM 2 MG/ML IJ SOLN
1.0000 mg | Freq: Once | INTRAMUSCULAR | Status: AC
Start: 2021-09-17 — End: 2021-09-17
  Administered 2021-09-17: 05:00:00 1 mg via INTRAVENOUS

## 2021-09-17 MED ORDER — VH PROPOFOL INFUSION 10 MG/ML (WRAPPED)
INTRAVENOUS | Status: AC
Start: 2021-09-17 — End: 2021-09-17
  Administered 2021-09-17: 08:00:00 40 ug/kg/min via INTRAVENOUS
  Filled 2021-09-17: qty 100

## 2021-09-17 MED ORDER — VH PROPOFOL INFUSION 10 MG/ML (WRAPPED)
INTRAVENOUS | Status: AC
Start: 2021-09-17 — End: ?
  Filled 2021-09-17: qty 100

## 2021-09-17 NOTE — UM Notes (Signed)
[facility] Utilization Management Review Sheet    Facility :  [facility]    NAME: [name] [name]  MR#: [id number]    CSN#: [id number]    ROOM: 3520/3520-A AGE: 65 y.o.    Date of Birth: 1956/04/09    ADMIT DATE AND TIME: 09/17/2021 @ 0612    PATIENT CLASS: IP    ATTENDING PHYSICIAN: Lajuan Lines, MD      PAYOR:Payor: MEDICARE [employer] / Plan: [employer] MEDICARE ADVANTAGE / Product Type: MANAGED MEDICARE /     AUTH #: Pending    DIAGNOSIS:     ICD-10-CM    1. Acute on chronic respiratory failure with hypoxia and hypercapnia  J96.21     J96.22           HISTORY:   Past Medical History:   Diagnosis Date    Anxiety     Chronic obstructive pulmonary disease     Depression     Diabetes mellitus     Hyperlipidemia     Malignant neoplasm      History of present illness: The patient presents from [facility] with altered mental status, acute hypercapnic respiratory failure, hypoglycemia and suspected drug overdose.  She presented to [facility] due to mental status changed due to suspected overdose.  She was obtunded and is unable to give any history.  She was emergently intubated at [facility]    Vitals upon admission: 36.7, 89, 96%, 22, 144/93    Physical exam: Per md notes she is morbidly obese.  She in intubated.  Rhonchi present    Abnormal labs:  Istat abg ph 7.13, pco2 >118.0  Wbc 19.1  Hematocrit 49.7  Neutrophils 90.6  Lymphocytes 6.7  Na 133  Chloride 92  Osmolality calculated 269  Alt 149  Calcium ionized 3.86  D dimer 0.91    Imaging:  Chest xray: Mild central vascular prominence otherwise no acute changes.  Ct head: Faint 8 mm high attenuation medially in the medial right temporal lobe is nonspecific. This could represent calcification or small focal area of hemorrhage. Continued follow-up is recommended.  2. Otherwise no acute changes are identified.  Chest xray:  Stable mild interstitial prominence that may represent pulmonary vascular congestion.  Chest xray:  ET tube and NG tube in good position. COPD and mild interstitial  changes. Subsegmental right suprahilar area and left base.     Assessment/Plan:  Albuterol neb q 6 hours  Rocephin 2 g iv qd  Doxycycline 100 mg per ng tube bid  Lovenox 40 mg sc qd  Pepcid 20 mg per ng tube q 12 hours  Solu medrol 40 mg iv qd  NS @ 75 ml/hr iv continuous  Precedex 0.3 - 1 mcg/kg/hr iv continuous  Propofol 1-50 mcg/kg/min iv continuous  NPO  Glucose poct q 2 hours  Daily sedation vacation  Turn patient q 2 hours  Cardiac monitoring  Incentive spirometry q hour while awake  I/O q hour  Vital signs q hour  Daily weight  Aspiration precautions  Fall precautions  Capnography q pm  Ventilator protocol  Restraints  Rcat protocol continuous  Pneumatic compression knee highs    Inpatient appropriate per MCG-25th edition PG-RF    Mardelle Matte, RN, BSN  [facility]   Utilization Management  Phone:  [phone]  Fax:  [phone]

## 2021-09-17 NOTE — Assessment & Plan Note (Signed)
A/P: Continue statin

## 2021-09-17 NOTE — Assessment & Plan Note (Addendum)
A/P: Likely predominant driver of her acute hypoxic respiratory failure and delirium  -Continue Solu-Medrol to 40 mg every 8 hours  -Continue standing DuoNebs  -Titrate supplemental oxygen to maintain saturation between 88 and 92%  -Doxycycline to cover atypical infections  -Sputum cultures pending

## 2021-09-17 NOTE — Assessment & Plan Note (Addendum)
A/P: Possibly secondary to CAP.  WBC 21.  Scattered nodules seen on CT chest  -Narrow coverage to doxycycline only.  Discontinue ceftriaxone  -Continue trend white count

## 2021-09-17 NOTE — Progress Notes (Signed)
09/17/21 1714   Adult Ventilator Settings   Vent Mode VC-AC   Resp Rate (Set) 22   PEEP/EPAP 8 cm H20   Vt (Set, mL) 430 mL   Insp Time (sec) 0.9 sec   FiO2 50 %   Trigger (L/min or cmH2O) 2 L/min         PATIENT NAME [name] [name],[name] [name]   ATTENDING PHYSICIAN Lajuan Lines, MD   TODAY'S DATE 09/17/2021   LENGTH OF STAY  0 DAYS     CURRENT THERAPY     OXYGEN THERAPY  SpO2: 94 % (09/17/2021  3:30 PM)  O2 Device: Vent (09/17/2021  3:30 PM)  FiO2: 50 % (09/17/2021  5:14 PM)  O2 Flow Rate (L/min): 8 L/min (09/17/2021  1:10 AM)     WEANING ATTEMPT  Patient does not qualify for SBT at this time.   INCREASED REQUIREMENTS THIS SHIFT     LAST ABG  pH  7.36  (10/17 0634)   PO2  75 mm Hg (10/17 0634)   PCO2  62.9* mm Hg (10/17 0634)   HCO3  35.2* mMol/L (10/17 0634)   O2 Sat %  94* % (10/17 0634)    *Only displays ABG within last 24 hours.   RECOMMENDATIONS                  Problem: Ventilation  Goal: Improve gas exchange  Outcome: In Progress   Problem: Bronchoconstriction  Goal:  Improve Breath Sounds/ Decrease Wheezing  Outcome: In Progress

## 2021-09-17 NOTE — Assessment & Plan Note (Addendum)
A/P:   -Home trazodone on hold  -Continue Risperdal

## 2021-09-17 NOTE — Nursing Progress Note (Signed)
NURSE NOTE SUMMARY  [facility] - CRITICAL CARE 2   Patient Name: [name],[name]   Attending Physician: Lajuan Lines, MD   Today's date:   09/17/2021 LOS: 0 days   Shift Summary:                                                              0603:Pt arrived from [facility] with transport crew, updated report received. Pt intubated, on propofol, vital signs stable. Pt fully assessed, see flowsheet.   Provider Notifications:        Rapid Response Notifications:  Mobility:      PMP Activity: Step 1 - Bedrest (09/17/2021  8:00 AM)     Weight tracking:  Family Dynamic:   Last 3 Weights for the past 72 hrs (Last 3 readings):   Weight   09/17/21 0630 115.3 kg (254 lb 3.1 oz)             Last Bowel Movement   Last BM Date:  (PTA)

## 2021-09-17 NOTE — H&P (Signed)
[facility]  Admission Note    Patient Name: [name] [name]    Attending Physician: Sharalyn Ink, MD                                             LOS:  0 DAYS   Primary Care Physician: [name] Pippins, MD            ROOM#: [id number]                                  Assessment    65 year old intubated female with past medical history of COPD on home oxygen 2 L at night, diabetes mellitus, hyperlipidemia, substance abuse, current tobacco use, depression and anxiety transferred from [facility] emergency department altered mental status, acute hypercapnic respiratory failure, hypoglycemia and suspected drug overdose.    Interval Events / ICU Course    10/17: Intubated at [facility].  Transfer to [facility] ICU    Assessment and Plan:                                                           Patient Active Hospital Problem List:    Cardiovascular and Mediastinum  Hyperlipidemia  Assessment & Plan  A/P: Continue statin    Respiratory  Acute on chronic respiratory failure with hypoxia and hypercapnia  Assessment & Plan  A/P: Suspect CAP superimposed on COPD in setting of possible trazodone overdose. Intubated prior to arrival. Seen on 10/14 at [facility] emergency department for COPD exacerbation from not having her nebulizer treatments with recent cross-country travel with mildly elevated D-dimer; she left AMA prior to CTA chest to r/o PE.   -Supplemental oxygen as needed  -Follow blood and sputum cxs  -Ceftriaxone, azithromycin and steroids   -Bronchodilators ATC  -CXR   -Aspiration precautions   -Send urine legionella, urine strep pneumonia antigen and resp virus panel, procal, MRSA pcr  -SBT daily and Keep HOB elevated 30 degrees       Chronic obstructive pulmonary disease  Assessment & Plan  A/P: See plan for acute on chronic respiratory failure with hypoxia and hypercapnia    Endocrine  Diabetes mellitus  Assessment & Plan  A/P: Presents to the OSH with blood glucose in the 50s with mild transient response in mental  status after D50.  -Hold home oral agents for now  -Monitor electrolytes   -Monitor BG      Other  Leukocytosis, unspecified type  Assessment & Plan  A/P: Possibly secondary to CAP.  WBC 21.  Chest x-ray notes mild interstitial vascular congestion.  She is afebrile.  -Monitor lactic acid   -Monitor CBC daily  -Add ceftriaxone and azithromycin  -Follow blood, sputum, urine cxs      Depression  Assessment & Plan  A/P: Hold trazodone in setting of AMS and respiratory failure on MV    * AMS (altered mental status)  Assessment & Plan  A/P: Presents to OSH obtunded with hypercapnia and hypoglycemia, concern for possible drug overdose.  Transient response to D50 with no response to Narcan.  UDS positive for cannabinoids.  She is on trazodone for hx depression and  anxiety. CT head notes nonspecific faint 8 mm high in the medial right temporal lobe, could represent calcification versus focal area of hemorrhage, recommend follow-up.   -Check acetaminophen and salicylate level  -Neurochecks                General ICU Assessment / Plans - if applicable    Vascular access: Peripherals.      GI Prophylaxis: None needed.  Nutrition: NPO (for respiratory or hemodynamic instability).              Sedation: Sedation protocol: Propofol/fentanyl.  Foley Catheter: not needed  VTE Prophylaxis: SCDs, Lovenox     Other:  Code Status: Full Code  Disposition: To ICU under [facility]        HISTORY / Subjective    65 year old intubated female with past medical history of COPD on home oxygen 2 L at night, diabetes mellitus, hyperlipidemia, substance abuse, current tobacco use, depression and anxiety who presents to [facility] emergency department with altered mental status.  On arrival she was obtunded and unable to provide history.  She was noted to have a blood glucose of 56 with transient response status after receiving IV D50. She was then given IV Narcan with no response. Initial ABG 7.13 greater than 118 PO2 92.  She remained obtunded  and was intubated.  RSI with propofol, etomidate, fentanyl, succinylcholine.  She received DuoNeb, Solu-Medrol 125 mg IV and normal saline 1 L.  Significant labs reveal WBC 21, glucose 54, sodium 133, chloride 91, AST 54, ALT 175, UDS positive for cannabinoids.  CT head notes nonspecific faint 8 mm high in the medial right temporal lobe, could represent calcification versus focal area of hemorrhage, recommend follow-up.  Chest x-ray notes mild interstitial prominence.      She was recently seen on 10/14 at [facility] emergency department for acute COPD exacerbation with elevated D-dimer. She had been traveling cross-country from [place] with her brother when she got to the hotel she was unable to find her medicine for her nebulizer. She left AMA prior to CT chest to rule out PE.    She was transferred to [facility] ICU for further medical evaluation and ongoing medical management      PAST MEDICAL HISTORY:   Past Medical History:  Past Medical History:   Diagnosis Date    Anxiety     Chronic obstructive pulmonary disease     Depression     Diabetes mellitus     Hyperlipidemia     Malignant neoplasm        Past Surgical History:    No past surgical history on file.    Family History:    No family history on file.    Social History:     Social History     Socioeconomic History    Marital status: Divorced   Tobacco Use    Smoking status: Every Day     Packs/day: 1.00     Types: Cigarettes    Smokeless tobacco: Never   Vaping Use    Vaping Use: Never used   Substance and Sexual Activity    Alcohol use: Never    Drug use: Never        PHYSICAL EXAM   Vitals: LMP  (LMP Unknown)    Admission Weight:    Last Weight:   Wt Readings from Last 1 Encounters:   09/17/21 111.6 kg (246 lb 0.5 oz)         There is no height  or weight on file to calculate BMI.  I/O: No intake or output data in the 24 hours ending 09/17/21 0645    Vent settings: FiO2:  [50 %-100 %] 50 %  S RR:  [16-22] 22  S VT:  [430 mL-550 mL] 430 mL  PEEP/EPAP:  [8 cm H20-10 cm  H20] 8 cm H20    Review of Systems   Unable to perform ROS: Intubated     Physical Exam  Vitals and nursing note reviewed.   Constitutional:       Appearance: She is morbidly obese.      Interventions: She is sedated and intubated.   HENT:      Head: Normocephalic.   Eyes:      Conjunctiva/sclera: Conjunctivae normal.      Pupils: Pupils are equal, round, and reactive to light.   Cardiovascular:      Rate and Rhythm: Regular rhythm.      Pulses: Normal pulses.   Pulmonary:      Effort: She is intubated.      Breath sounds: Rhonchi present.   Abdominal:      General: Bowel sounds are normal.      Palpations: Abdomen is soft.          Tubes/Lines/Airway              Patient Lines/Drains/Airways Status       Active PICC Line / CVC Line / PIV Line / Drain / Airway / Intraosseous Line / Epidural Line / ART Line / Line / Wound / Pressure Ulcer / NG/OG Tube       Name Placement date Placement time Site Days    Peripheral IV 09/16/21 20 G Right Antecubital 09/16/21  2330  Antecubital  less than 1    Peripheral IV 09/16/21 20 G Right Forearm 09/16/21  2343  Forearm  less than 1    Peripheral IV 09/17/21 20 G Left Hand 09/17/21  0323  Hand  less than 1    Urethral Catheter 09/17/21  0245  --  less than 1    NG/OG Tube Nasogastric 16 Fr. Right nostril 09/17/21  0253  Right nostril  less than 1                           LABS:   Labs Reviewed:    Estimated Creatinine Clearance: 125.7 mL/min (based on SCr of 0.63 mg/dL).    CBC:   Recent Labs   Lab 09/16/21  2330   WBC 21.0*   Hemoglobin 15.7   Hematocrit 49.0*   PLT CT 293         Coags:   Recent Labs   Lab 09/16/21  2330   PT 10.2   PT INR 0.9         ABGs:  No results found for: ABGCOLLECTIO, ALLENSTEST, PHART, PCO2ART, PO2ART, HCO3ART, BEART, O2SATART    BE, ISTAT   Date Value Ref Range Status   09/17/2021 7 mMol/L Final   09/17/2021 6 mMol/L Final   09/17/2021  -2 - 2 mMol/L Final    `Unable to calculate result due to analyte out of the analytical measurement range     DELS,  ISTAT   Date Value Ref Range Status   09/17/2021 Vent  Final   09/17/2021 Vent  Final   09/17/2021 Oxy Mask  Final     HCO3, ISTAT   Date Value Ref Range Status  09/17/2021 35.2 (H) 20.0 - 29.0 mMol/L Final   09/17/2021 38.7 (H) 20.0 - 29.0 mMol/L Final   09/17/2021  20.0 - 29.0 mMol/L Final    `Unable to calculate result due to analyte out of the analytical measurement range     O2 Sat, %, ISTAT   Date Value Ref Range Status   09/17/2021 94 (L) 96 - 100 % Final   09/17/2021 98 96 - 100 % Final   09/17/2021  96 - 100 % Final    `Unable to calculate result due to analyte out of the analytical measurement range     pH, ISTAT   Date Value Ref Range Status   09/17/2021 7.36 7.35 - 7.45 Final   09/17/2021 7.22 (L) 7.35 - 7.45 Final   09/17/2021 7.13 (LL) 7.35 - 7.45 Final     PO2, ISTAT   Date Value Ref Range Status   09/17/2021 75 75 - 100 mm Hg Final   09/17/2021 125 (H) 75 - 100 mm Hg Final   09/17/2021 92 75 - 100 mm Hg Final     Rate, ISTAT   Date Value Ref Range Status   09/17/2021 22  Final   09/17/2021 16  Final     TCO2 I-Stat   Date Value Ref Range Status   09/17/2021 37 (H) 24 - 29 mMol/L Final   09/17/2021 41 (H) 24 - 29 mMol/L Final   09/17/2021  24 - 29 mMol/L Final    `Unable to calculate result due to analyte out of the analytical measurement range     Tidal Volume I-Stat   Date Value Ref Range Status   09/17/2021 430  Final   09/17/2021 550  Final     PCO2, ISTAT   Date Value Ref Range Status   09/17/2021 62.9 (HH) 35.0 - 45.0 mm Hg Final   09/17/2021 93.5 (HH) 35.0 - 45.0 mm Hg Final   09/17/2021 >118.0 (HH) 35.0 - 45.0 mm Hg Final     i-STAT FIO2   Date Value Ref Range Status   09/17/2021 50.00 % Final   09/17/2021 40.00 % Final   09/17/2021 50 % Final     i-STAT Mode   Date Value Ref Range Status   09/17/2021 AC  Final   09/17/2021 AC  Final     i-STAT Peep   Date Value Ref Range Status   09/17/2021 8  Final   09/17/2021 10  Final     i-STAT Pressure Support   Date Value Ref Range Status   09/17/2021  0  Final   09/17/2021 0  Final     Lactic Acid I-Stat   Date Value Ref Range Status   09/17/2021 0.8 0.5 - 1.9 mMol/L Final   09/17/2021 1.0 0.5 - 1.9 mMol/L Final    Chemistry:   Recent Labs     09/16/21  2330   Sodium 133*   Potassium 4.6   Chloride 91*   CO2 35.0*   BUN 14   Creatinine 0.63   EGFR 100   Glucose 54*   Calcium 8.4*       LFTs:   Recent Labs   Lab 09/16/21  2330   Albumin 3.7   Protein, Total 7.0   Bilirubin, Total 0.4   Alkaline Phosphatase 74   ALT 175*   AST (SGOT) 54*   Glucose 54*              Other:    RADIOLOGY / IMAGING:  Imaging personally reviewed by me, including: CXR: Personally reviewed by me. and Head CT     ATTESTATION & BILLING      Patient's condition and plan discussed with: Dr Marland Mcalpine, patient, bedside nurse, emergency physician, and respiratory therapy    Billing:  Critical care time: 42 minutes    I managed/supervised life or organ supporting interventions that required frequent physician assessments. I devoted my full attention in the ICU to the direct care of this patient for this period of time while critically ill.    Any critical care time was performed today and is exclusive of teaching, billable procedures, and not overlapping with any other providers.    Signed by: Carleene Mains, NP   cc:Pcp, Traci Sermon, MD

## 2021-09-17 NOTE — Plan of Care (Addendum)
NURSE NOTE SUMMARY  [facility] - CRITICAL CARE 2   Patient Name: [name],[name]   Attending Physician: Lajuan Lines, MD   Today's date:   09/17/2021 LOS: 0 days   Shift Summary:                                                              1900: Report received from off going RN. Patient remains intubated on propofol and precedex.     2125: Bedside report given to RN.    2130: No belongings at bedside. Patient transported to room 3526.    Provider Notifications:        Rapid Response Notifications:  Mobility:      PMP Activity: Step 3 - Bed Mobility (09/17/2021  7:40 PM)     Weight tracking:  Family Dynamic:   Last 3 Weights for the past 72 hrs (Last 3 readings):   Weight   09/17/21 0630 115.3 kg (254 lb 3.1 oz)             Last Bowel Movement   Last BM Date:  (PTA)        Problem: Compromised Tissue integrity  Goal: Damaged tissue is healing and protected  Flowsheets (Taken 09/17/2021 2057)  Damaged tissue is healing and protected:   Monitor/assess Braden scale every shift   Provide wound care per wound care algorithm   Reposition patient every 2 hours and as needed unless able to reposition self   Increase activity as tolerated/progressive mobility   Relieve pressure to bony prominences for patients at moderate and high risk   Avoid shearing injuries   Keep intact skin clean and dry   Use bath wipes, not soap and water, for daily bathing   Use incontinence wipes for cleaning urine, stool and caustic drainage. Foley care as needed   Monitor external devices/tubes for correct placement to prevent pressure, friction and shearing     Problem: Non-Violent Restraints Interdisciplinary Plan  Goal: Will be injury free during the use of non-violent restraints  Outcome: Progressing  Flowsheets (Taken 09/17/2021 2057)  Will be injury free during the use of non-violent restraints:   Attempt all alternatives before use of restraints   Initiate least restrictive type of restraint that is  effective   Provide and maintain safe environment   Notify family of initiation of restraints   Include patient/family/caregiver in decisions related to safety   Ensure safety devices are properly applied and maintained   Document observed patient actions according to protocol   Remove restraints before the indicated maximum length of time when meets criteria for discontinuation   Document significant changes in patient condition   Provide debriefing as soon as possible and appropriate   Reassess need for continued restraints   Ensure that order for restraints has not expired     Problem: Inadequate Gas Exchange  Goal: Adequate oxygenation and improved ventilation  Flowsheets (Taken 09/17/2021 2057)  Adequate oxygenation and improved ventilation:   Assess lung sounds   Monitor SpO2 and treat as needed   Monitor and treat ETCO2   Provide mechanical and oxygen support to facilitate gas exchange   Position for maximum ventilatory efficiency   Plan activities to conserve energy: plan rest periods   Increase activity as tolerated/progressive mobility  Consult/collaborate with Respiratory Therapy     Problem: Artificial Airway  Goal: Endotracheal tube will be maintained  Outcome: Progressing  Flowsheets (Taken 09/17/2021 2057)  Endotracheal  tube will be maintained:   Suction secretions as needed   Keep head of bed at 30 degrees, unless contraindicated   Encourage/perform oral hygiene as appropriate   Perform deep oropharyngeal suctioning at least every 4 hours   Apply water-based moisturizer to lips   Utilize ETT securing device   Maintain and assess integrity of ETT securing device   Support ventilator tubing to avoid pressure from drag of tubing

## 2021-09-17 NOTE — Assessment & Plan Note (Addendum)
A/P: Due to acute exacerbation of COPD, also concern for possible trazodone overdose.  Some nodules on CT suggestive of inflammatory/infectious process.  Being covered for CAP Which I think is appropriate for the time being.    -Wean FiO2 to maintain saturation between 88 and 92%  -Follow blood and sputum cxs  -Alsip ceftriaxone, continue doxycycline for 5 days  -Continue Solu-Medrol 40 mg every 8 hours  -Bronchodilators ATC  -CT chest without evidence of pulmonary embolism or pulmonary fibrosis.  -Needs to quit smoking  -Should establish with a pulmonologist in [place] where she lives

## 2021-09-17 NOTE — Assessment & Plan Note (Addendum)
A/P: Likely secondary to hypoglycemia, acute hypercapnic respiratory failure, plus or minus trazodone overdose.   -Extubated yesterday, mentating appropriately  -Reports ongoing anxiety: Respite all was increased, will add hydroxyzine today

## 2021-09-17 NOTE — Plan of Care (Addendum)
NURSE NOTE SUMMARY  [facility] - CRITICAL CARE 2   Patient Name: [name] [name],[name] [name]   Attending Physician: Lajuan Lines, MD   Today's date:   09/17/2021 LOS: 0 days   Shift Summary:                                                              0730: Vital signs and assessment charted. Patient intubated and sedated on propofol 50 mcg/kg/min  Shift event:  Replaced Ionized Calcium  Decrease propofol gtt   Precedex  gtt started  Weaned off sedation  Failed SBT, patient restless, desaturating and HR elevated  T max 100.6F given tylenol      Provider Notifications:        Rapid Response Notifications:  Mobility:      PMP Activity: Step 1 - Bedrest (09/17/2021  4:00 PM)     Weight tracking:  Family Dynamic:   Last 3 Weights for the past 72 hrs (Last 3 readings):   Weight   09/17/21 0630 115.3 kg (254 lb 3.1 oz)             Last Bowel Movement   Last BM Date:  (PTA)       Problem: Inadequate Gas Exchange  Goal: Patent Airway maintained  Outcome: Progressing  Flowsheets (Taken 09/17/2021 1247)  Patent airway maintained:   Position patient for maximum ventilatory efficiency   Provide adequate fluid intake to liquefy secretions   Reinforce use of ordered respiratory interventions (i.e. CPAP, BiPAP, Incentive Spirometer, Acapella, etc.)   Suction secretions as needed   Reposition patient every 2 hours and as needed unless able to self-reposition     Problem: Artificial Airway  Goal: Endotracheal tube will be maintained  Outcome: Progressing  Flowsheets (Taken 09/17/2021 1247)  Endotracheal  tube will be maintained:   Suction secretions as needed   Encourage/perform oral hygiene as appropriate   Perform deep oropharyngeal suctioning at least every 4 hours   Apply water-based moisturizer to lips   Support ventilator tubing to avoid pressure from drag of tubing   Maintain and assess integrity of ETT securing device   Utilize ETT securing device     Problem: Non-Violent Restraints Interdisciplinary  Plan  Goal: Will be injury free during the use of non-violent restraints  Flowsheets (Taken 09/17/2021 1246)  Will be injury free during the use of non-violent restraints:   Attempt all alternatives before use of restraints   Initiate least restrictive type of restraint that is effective   Notify family of initiation of restraints   Provide and maintain safe environment   Include patient/family/caregiver in decisions related to safety   Nurse to accompany patient off unit when on restraints   Document observed patient actions according to protocol   Ensure safety devices are properly applied and maintained   Remove restraints before the indicated maximum length of time when meets criteria for discontinuation   Provide debriefing as soon as possible and appropriate   Document significant changes in patient condition   Reassess need for continued restraints     Problem: Moderate/High Fall Risk Score >5  Goal: Patient will remain free of falls  Outcome: Progressing  Flowsheets (Taken 09/17/2021 0800)  VH High Risk (Greater than 13):   ALL REQUIRED LOW INTERVENTIONS  ALL REQUIRED MODERATE INTERVENTIONS   RED "HIGH FALL RISK" SIGNAGE   BED ALARM WILL BE ACTIVATED WHEN THE PATEINT IS IN BED WITH SIGNAGE "RESET BED ALARM"

## 2021-09-17 NOTE — ED Notes (Addendum)
The reamaining vial of ketamine and propofol sent with VMT.

## 2021-09-17 NOTE — Progress Notes (Signed)
Readmission Risk  [facility] - CRITICAL CARE 2   Patient Name: [name] [name],[name] [name]   Attending Physician: Lajuan Lines, MD   Today's date:   09/17/2021 LOS: 0 days   Expected Discharge Date      Readmission Assessment:                                                              Discharge Planning  ReAdmit Risk Score: 19.26  Does the patient have perscription coverage?: Yes  CM Comments: RNCM, AKP- M: 10/17: acute on chronic respiratory failure, requiring mech ventilation.  SBT today.  PMH: COPD,DM. Smokes cigarettes and marajuana. Patient was traveling through the area on her way to return home to [place], [place]- she had been traveling cross country with her brother.  She uses HOT at home at nighttime, per daughter she was not using on the trip.  She lives alone in an apt in [place], [place]. Independent w/ her own care at home, driving.  Established with PCP in [place] , [place]- DR. Donzetta Sprung. Daughter, Pattricia Boss, at the bedside.  RNCM /SW will continue to follow and assist.       IDPA:   Patient Type  Within 30 Days of Previous Admission?: No  Healthcare Decisions  Interviewed:: Family  Name of interviewee if other than the pt:: daughterGordan Payment  Interviewee Contact Information:: [phone]  Orientation/Decision Making Abilities of Patient: Patient on ventilator  Advance Directive: Patient does not have advance directive  Healthcare Agent Appointed: Other (Comment)  Prior to admission  Type of Residence: Private residence  Home Layout: One level, Engineer, structural (2nd floor apt.)  Have running water, electricity, heat, etc?: Yes  Living Arrangements: Alone  How do you get to your MD appointments?: self  How do you get your groceries?: self  Who fixes your meals?: self  Who does your laundry?: self  Who picks up your prescriptions?: self  Dressing: Unable to assess  Grooming: Unable to assess  Feeding: Unable to assess  Bathing: Unable to assess  Toileting: Unable to assess  DME Currently at Home: Home O2, Nebulizer,  Glucometer (daughter believes the patient has her home O2 through [place].)  Home Care/Community Services: None  Discharge Planning  Support Systems: Family members, Children  Patient expects to be discharged to:: TBD  Anticipated [place] plan discussed with:: Same as interviewed  Mode of transportation:: Private car (family member)  Does the patient have perscription coverage?: Yes  Consults/Providers  PT Evaluation Needed: Yes (Comment)  OT Evalulation Needed: Yes (Comment)  Correct PCP listed in Epic?: No (comment) (primary MD not on file- Dr. Donzetta Sprung [facility] in [place] , [place].)  Family and PCP  PCP - first name: Aurther Loft  PCP - middle name: G  PCP - last name: Reuel Boom  PCP - city: [place]  PCP - state: [place]  PCP - phone : [phone]  PCP - NPI: [id number]  PCP - FAX: [phone]   30 Day Readmission:       Provider Notifications:       Kendalyn Cranfield K. Pinner-Mailen Newborn, RN BSN  Case Production designer, theatre/television/film   [employer]  [phone]

## 2021-09-17 NOTE — Assessment & Plan Note (Addendum)
A/P: Presents to the OSH with blood glucose in the 50s with mild transient response in mental status after D50.  -Hold home oral agents for now  -Monitor electrolytes   -Monitor BG

## 2021-09-17 NOTE — Assessment & Plan Note (Deleted)
A/P:

## 2021-09-18 ENCOUNTER — Inpatient Hospital Stay: Payer: Medicare (Managed Care)

## 2021-09-18 LAB — CBC AND DIFFERENTIAL
Basophils %: 0.3 % (ref 0.0–3.0)
Basophils Absolute: 0 10*3/uL (ref 0.0–0.3)
Eosinophils %: 0.1 % (ref 0.0–7.0)
Eosinophils Absolute: 0 10*3/uL (ref 0.0–0.8)
Hematocrit: 48.7 % — ABNORMAL HIGH (ref 36.0–48.0)
Hemoglobin: 14.6 gm/dL (ref 12.0–16.0)
Lymphocytes Absolute: 2.9 10*3/uL (ref 0.6–5.1)
Lymphocytes: 18.5 % (ref 15.0–46.0)
MCH: 29 pg (ref 28–35)
MCHC: 30 gm/dL — ABNORMAL LOW (ref 32–36)
MCV: 98 fL (ref 80–100)
MPV: 7.5 fL (ref 6.0–10.0)
Monocytes Absolute: 1.4 10*3/uL (ref 0.1–1.7)
Monocytes: 8.7 % (ref 3.0–15.0)
Neutrophils %: 72.4 % (ref 42.0–78.0)
Neutrophils Absolute: 11.2 10*3/uL — ABNORMAL HIGH (ref 1.7–8.6)
PLT CT: 218 10*3/uL (ref 130–440)
RBC: 4.97 10*6/uL (ref 3.80–5.00)
RDW: 13.9 % (ref 11.0–14.0)
WBC: 15.5 10*3/uL — ABNORMAL HIGH (ref 4.0–11.0)

## 2021-09-18 LAB — I-STAT G3 ARTERIAL CARTRIDGE
BE, ISTAT: 8 mMol/L
BE, ISTAT: 7 mMol/L
HCO3, ISTAT: 33.6 mMol/L — ABNORMAL HIGH (ref 20.0–29.0)
HCO3, ISTAT: 32.7 mMol/L — ABNORMAL HIGH (ref 20.0–29.0)
O2 Sat, %, ISTAT: 83 % — ABNORMAL LOW (ref 96–100)
O2 Sat, %, ISTAT: 88 % — ABNORMAL LOW (ref 96–100)
PCO2, ISTAT: 46.7 mm Hg — ABNORMAL HIGH (ref 35.0–45.0)
PCO2, ISTAT: 47.5 mm Hg — ABNORMAL HIGH (ref 35.0–45.0)
PIP, ISTAT: 0
PIP, ISTAT: 0
PO2, ISTAT: 45 mm Hg — CL (ref 75–100)
PO2, ISTAT: 53 mm Hg — ABNORMAL LOW (ref 75–100)
Rate, ISTAT: 22
Rate, ISTAT: 0
Room Number I-Stat: 3526
Room Number I-Stat: 3526
TCO2 I-Stat: 35 mMol/L — ABNORMAL HIGH (ref 24–29)
TCO2 I-Stat: 34 mMol/L — ABNORMAL HIGH (ref 24–29)
Tidal Volume I-Stat: 420
Tidal Volume I-Stat: 0
i-STAT FIO2: 40 %
i-STAT FIO2: 40 %
i-STAT Peep: 8
i-STAT Peep: 8
i-STAT Pressure Support: 0
i-STAT Pressure Support: 5
pH, ISTAT: 7.47 — ABNORMAL HIGH (ref 7.35–7.45)
pH, ISTAT: 7.45 (ref 7.35–7.45)

## 2021-09-18 LAB — ECG 12-LEAD
P Wave Axis: 60 deg
P Wave Axis: 55 deg
P-R Interval: 154 ms
P-R Interval: 149 ms
Patient Age: 65 years
Patient Age: 65 years
Q-T Interval(Corrected): 489 ms
Q-T Interval(Corrected): 473 ms
Q-T Interval: 368 ms
Q-T Interval: 384 ms
QRS Axis: 49 deg
QRS Axis: 33 deg
QRS Duration: 85 ms
QRS Duration: 82 ms
T Axis: 59 years
T Axis: 66 years
Ventricular Rate: 106 //min
Ventricular Rate: 91 //min

## 2021-09-18 LAB — BASIC METABOLIC PANEL
Anion Gap: 11.3 mMol/L (ref 7.0–18.0)
BUN / Creatinine Ratio: 19.5 Ratio (ref 10.0–30.0)
BUN: 16 mg/dL (ref 7–22)
CO2: 30 mMol/L (ref 20–30)
Calcium: 8.9 mg/dL (ref 8.5–10.5)
Chloride: 99 mMol/L (ref 98–110)
Creatinine: 0.82 mg/dL (ref 0.60–1.20)
EGFR: 80 mL/min/{1.73_m2} (ref 60–150)
Glucose: 171 mg/dL — ABNORMAL HIGH (ref 71–99)
Osmolality Calculated: 277 mOsm/kg (ref 275–300)
Potassium: 4.3 mMol/L (ref 3.5–5.3)
Sodium: 136 mMol/L (ref 136–147)

## 2021-09-18 LAB — HEPATIC FUNCTION PANEL
ALT: 100 U/L — ABNORMAL HIGH (ref 0–55)
AST (SGOT): 21 U/L (ref 10–42)
Albumin/Globulin Ratio: 1.07 Ratio (ref 0.80–2.00)
Albumin: 2.9 gm/dL — ABNORMAL LOW (ref 3.5–5.0)
Alkaline Phosphatase: 63 U/L (ref 40–145)
Bilirubin Direct: 0.2 mg/dL (ref 0.0–0.3)
Bilirubin, Total: 0.4 mg/dL (ref 0.1–1.2)
Globulin: 2.7 gm/dL (ref 2.0–4.0)
Protein, Total: 5.6 gm/dL — ABNORMAL LOW (ref 6.0–8.3)

## 2021-09-18 LAB — CALCIUM, IONIZED
Calcium, Ionized: 4.68 mg/dL (ref 4.35–5.10)
Calcium, Ionized: 4.47 mg/dL (ref 4.35–5.10)

## 2021-09-18 LAB — MAGNESIUM: Magnesium: 2.1 mg/dL (ref 1.6–2.6)

## 2021-09-18 LAB — PHOSPHORUS: Phosphorus: 2.8 mg/dL (ref 2.3–4.7)

## 2021-09-18 LAB — PROCALCITONIN: Procalcitonin: 0.06 ng/mL (ref 0.00–0.24)

## 2021-09-18 MED ORDER — FENTANYL CITRATE (PF) 50 MCG/ML IJ SOLN (WRAP)
50.0000 ug | INTRAMUSCULAR | Status: DC | PRN
Start: 2021-09-18 — End: 2021-09-19
  Administered 2021-09-18 (×2): 50 ug via INTRAVENOUS

## 2021-09-18 MED ORDER — VH DEXMEDETOMIDINE HCL IN NACL 400 MCG/100ML (SIMPLE)
0.3000 ug/kg/h | INTRAVENOUS | Status: DC
Start: 2021-09-18 — End: 2021-09-19
  Administered 2021-09-18: 11:00:00 0.3 ug/kg/h via INTRAVENOUS

## 2021-09-18 MED ORDER — ACETAMINOPHEN 10 MG/ML IV SOLN
1000.0000 mg | Freq: Once | INTRAVENOUS | Status: AC
Start: 2021-09-18 — End: 2021-09-18
  Administered 2021-09-18: 22:00:00 1000 mg via INTRAVENOUS
  Filled 2021-09-18: qty 100

## 2021-09-18 MED ORDER — VH PERFLUTREN LIPID MICROSPHERE 6.52 MG/ML IV SUSP
INTRAVENOUS | Status: AC
Start: 2021-09-18 — End: ?
  Filled 2021-09-18: qty 2

## 2021-09-18 MED ORDER — IOHEXOL 350 MG/ML IV SOLN
65.0000 mL | Freq: Once | INTRAVENOUS | Status: AC | PRN
Start: 2021-09-18 — End: 2021-09-18
  Administered 2021-09-18: 09:00:00 65 mL via INTRAVENOUS

## 2021-09-18 MED ORDER — NICOTINE 21 MG/24HR TD PT24
1.0000 | MEDICATED_PATCH | Freq: Every day | TRANSDERMAL | Status: DC
Start: 2021-09-18 — End: 2021-09-20
  Administered 2021-09-18: 17:00:00 1 via TRANSDERMAL
  Filled 2021-09-18 (×3): qty 1

## 2021-09-18 MED ORDER — TRAZODONE HCL 50 MG PO TABS
50.0000 mg | ORAL_TABLET | Freq: Every evening | ORAL | Status: DC
Start: 2021-09-18 — End: 2021-09-18

## 2021-09-18 MED ORDER — METHYLPREDNISOLONE SODIUM SUCC 40 MG IJ SOLR
40.0000 mg | Freq: Three times a day (TID) | INTRAMUSCULAR | Status: DC
Start: 2021-09-18 — End: 2021-09-20
  Administered 2021-09-18 – 2021-09-20 (×8): 40 mg via INTRAVENOUS
  Filled 2021-09-18 (×8): qty 1

## 2021-09-18 MED ORDER — ACETAMINOPHEN 325 MG PO TABS
650.0000 mg | ORAL_TABLET | Freq: Four times a day (QID) | ORAL | Status: DC | PRN
Start: 2021-09-18 — End: 2021-09-19

## 2021-09-18 MED ORDER — VH PERFLUTREN LIPID MICROSPHERE 6.52 MG/ML IV SUSP
Freq: Once | INTRAVENOUS | Status: AC
Start: 2021-09-18 — End: 2021-09-18
  Administered 2021-09-18: 10:00:00 2 mL via INTRAVENOUS

## 2021-09-18 MED ORDER — FUROSEMIDE 10 MG/ML IJ SOLN
20.0000 mg | Freq: Once | INTRAMUSCULAR | Status: AC
Start: 2021-09-18 — End: 2021-09-18
  Administered 2021-09-18: 11:00:00 20 mg via INTRAVENOUS
  Filled 2021-09-18: qty 2

## 2021-09-18 MED ORDER — ALBUTEROL-IPRATROPIUM 2.5-0.5 (3) MG/3ML IN SOLN
3.0000 mL | RESPIRATORY_TRACT | Status: DC
Start: 2021-09-18 — End: 2021-09-20
  Administered 2021-09-18 – 2021-09-20 (×12): 3 mL via RESPIRATORY_TRACT
  Filled 2021-09-18 (×13): qty 3

## 2021-09-18 MED ORDER — RISPERIDONE 0.5 MG PO TABS
2.0000 mg | ORAL_TABLET | Freq: Two times a day (BID) | ORAL | Status: DC
Start: 2021-09-18 — End: 2021-09-19
  Administered 2021-09-18 – 2021-09-19 (×2): 2 mg via NASOGASTRIC
  Filled 2021-09-18 (×3): qty 4

## 2021-09-18 MED ORDER — VH MAGNESIUM SULFATE 2 G IN 50 ML IV PREMIX
2.0000 g | Freq: Once | INTRAVENOUS | Status: AC
Start: 2021-09-18 — End: 2021-09-18
  Administered 2021-09-18: 06:00:00 2 g via INTRAVENOUS
  Filled 2021-09-18: qty 50

## 2021-09-18 MED ORDER — MIDAZOLAM HCL 1 MG/ML IJ SOLN (WRAP)
2.0000 mg | INTRAMUSCULAR | Status: DC | PRN
Start: 2021-09-18 — End: 2021-09-18
  Administered 2021-09-18: 04:00:00 2 mg via INTRAVENOUS
  Filled 2021-09-18: qty 2

## 2021-09-18 NOTE — Progress Notes (Signed)
PT extubated to BIPAP 16/8 50%, will keep on 2hrs then try on HFNC or NC.        09/18/21 1714   BiPAP/CPAP Activities   Status: Bilevel/CPAP - In Use   Skin integrity No redness or breakdown noted   BiPAP/CPAP interface fit check Yes   Adverse Reactions None   Safety Check Done Yes   BiPAP/CPAP Settings   Vent Mode PS/CPAP in NIV   PEEP/EPAP 8 cm H20   Pressure Support / IPAP 8 cmH20  (IPAP 16)   FiO2 50 %   BiPAP/CPAP Measurements   Resp Rate Total 29 br/min   Exhaled Vt 450 mL   MVe 4.01 l/m   SpO2 93 %

## 2021-09-18 NOTE — Plan of Care (Signed)
Problem: Compromised Tissue integrity  Goal: Damaged tissue is healing and protected  Outcome: Progressing  Goal: Nutritional status is improving  Outcome: Progressing     Problem: Moderate/High Fall Risk Score >5  Goal: Patient will remain free of falls  Outcome: Progressing  Flowsheets (Taken 09/18/2021 0740)  VH Moderate Risk (6-13):   ALL REQUIRED LOW INTERVENTIONS   INITIATE YELLOW "FALL RISK" SIGNAGE   YELLOW NON-SKID SLIPPERS   YELLOW "FALL RISK" ARM BAND     Problem: Non-Violent Restraints Interdisciplinary Plan  Goal: Will be injury free during the use of non-violent restraints  Outcome: Progressing     Problem: Inadequate Gas Exchange  Goal: Adequate oxygenation and improved ventilation  Outcome: Progressing  Goal: Patent Airway maintained  Outcome: Progressing

## 2021-09-18 NOTE — Progress Notes (Signed)
NURSE NOTE SUMMARY  [facility] - CRITICAL CARE 3   Patient Name: [name] [name],[name] [name]   Attending Physician: Lajuan Lines, MD   Today's date:   09/18/2021 LOS: 1 days   Shift Summary:                                                              1610 patient back from CT. VSS during transport.  1110 Precedex gtt started.  1526 propofol gtt off  1710 pt extubated to bipap 50% peep 8   Provider Notifications:        Rapid Response Notifications:  Mobility:      PMP Activity: Step 3 - Bed Mobility (09/17/2021  7:40 PM)     Weight tracking:  Family Dynamic:   Last 3 Weights for the past 72 hrs (Last 3 readings):   Weight   09/17/21 0630 115.3 kg (254 lb 3.1 oz)             Last Bowel Movement   Last BM Date:  (PTA)

## 2021-09-18 NOTE — Progress Notes (Signed)
This is a recommendation based on chart review only.       Antimicrobial Stewardship Note        [name] [name] is a 65 y.o. admitted with signs or symptoms possibly attributed to sepsis and/or a lower respiratory tract infection (LRTI), for which the provider has ordered a procalcitonin (PCT) level.     The [facility] pharmacist will   Order subsequent PCT labs after a prescriber orders an initial level in patients with sepsis and/or pneumonia  Recommend based on the provided algorithm to de-escalate or discontinue antimicrobial therapy per the Procalcitonin Pharmacy Protocol        Date   PCT Level Follow-up Plan/Recommendation   09/17/21   0.06 ng/dL      16/10/96   0.45 ng/dL    Discontinuation of antibiotics is strongly encouraged.         PCT Level (ng/mL)    LRTI Sepsis Recommendation   <0.1 OR   drop >90% of baseline <0.25 OR   drop >90% of baseline Discontinuation or de-escalation strongly encouraged   0.1 - < 0.25 OR  drop >80% of baseline 0.25 - < 0.5 OR  drop >80% of baseline Discontinuation or de-escalation encouraged   If first 2 levels of PCT are <0.5 ng/mL for sepsis, <0.25 ng/mL for pneumonia, no further PCT testing is recommended   Continuation of antibiotics beyond standard duration of therapy is not recommended, regardless of PCT level   Microbiological data should be used to guide de-escalation, regardless of PCT level        Thanks,   Merica Prell D. Lenise Arena, PharmD, [employer], BCCCP  Clinical Pharmacy Specialist - Critical Care  Phone # [phone]

## 2021-09-18 NOTE — Assessment & Plan Note (Addendum)
A/P: Daughter reports severe anxiety at baseline  -Risperdal increased  -Add hydroxyzine today

## 2021-09-18 NOTE — Progress Notes (Signed)
[facility]  Progress Note    Patient Name: [name] [name],[name] [name]    Attending Physician: Lajuan Lines, MD                                             LOS:  1 DAYS   Primary Care Physician: Patsy Lager, MD        ROOM#: [id number]/[id number]-A                                      Assessment     53 YOF with COPD on home O2 that is admitted for AMS and AECOPD requiring intubation and mechanical ventilation. Found to be hypercapneic and hypoglycemic.       Interval Events / ICU Course    10/17: Admitted to ICU  10/18: Difficulty with sedation.  Required Versed pushes overnight.  Increased FiO2 requirements due to agitation      Assessment & Plan    Patient Active Hospital Problem List:  Cardiovascular and Mediastinum  Hyperlipidemia  Assessment & Plan  A/P: Continue statin    Respiratory  Acute on chronic respiratory failure with hypoxia and hypercapnia  Assessment & Plan  A/P: Due to acute exacerbation of COPD, also concern for possible trazodone overdose.  Some nodules on CT suggestive of inflammatory/infectious process.  Being covered for CAP Which I think is appropriate for the time being.    -Wean FiO2 to maintain saturation between 88 and 92%  -Follow blood and sputum cxs  -Continue ceftriaxone and doxycycline  -Change prednisone to Solu-Medrol  -Bronchodilators ATC  -CT chest without evidence of pulmonary embolism or pulmonary fibrosis.  -Aspiration precautions   -Daily SAT and SBT      Chronic obstructive pulmonary disease with acute exacerbation  Assessment & Plan  A/P: Likely predominant driver of her acute hypoxic respiratory failure and delirium  -Increased Solu-Medrol to 40 mg every 8 hours  -Increase standing DuoNebs  -Still with shark fin deformity on end-tidal CO2, indicative of ongoing bronchospasm  -Titrate supplemental oxygen to maintain saturation between 88 and 92%  -Doxycycline to cover atypical infections  -Sputum cultures pending  -Check echocardiogram    Endocrine  Diabetes mellitus  Assessment &  Plan  A/P: Presents to the OSH with blood glucose in the 50s with mild transient response in mental status after D50.  -Hold home oral agents for now  -Monitor electrolytes   -Monitor BG      Other  Leukocytosis, unspecified type  Assessment & Plan  A/P: Possibly secondary to CAP.  WBC 21.  Scattered nodules seen on CT chest  -Favor continued ceftriaxone and doxycycline  -Likely discontinue if cultures negative tomorrow  -Continue trend white count        Depression  Assessment & Plan  A/P:   -Restart trazodone  -Increase Risperdal  -On Precedex    Anxiety  Assessment & Plan  A/P: Daughter reports severe anxiety at baseline  -Increasing Risperdal  -May add Klonopin  -Adjusting Precedex    * AMS (altered mental status)  Assessment & Plan  A/P: Likely secondary to hypoglycemia, acute hypercapnic respiratory failure, plus or minus trazodone overdose.   -Mentating appropriately today on ventilator  -Agitation/anxiety remains an issue  -Difficult sedation challenge  -Neurochecks  General ICU Assessment / Plans - if applicable    Vascular access: Peripherals.  GI Prophylaxis: H2 blocker  Nutrition: NPO.  Sedation: Sedation protocol: Propofol/fentanyl.  Foley Catheter: discontinue  VTE Prophylaxis: Medication VTE Prophylaxis Orders: enoxaparin (LOVENOX) syringe 40 mg  Mechanical VTE Prophylaxis Orders: Mechanical VTE: Pneumatic Compression; Knee high    Other:  Code Status: Full Code      HISTORY / Subjective      Difficulty with sedation.  Required Versed pushes overnight.  Increased FiO2 requirements due to agitation    PHYSICAL EXAM   Vitals: BP 119/71    Pulse 67    Temp 99 F (37.2 C)    Resp 22    Ht 1.65 m (5' 4.96")    Wt 115.3 kg (254 lb 3.1 oz)    LMP  (LMP Unknown)    SpO2 97%    BMI 42.35 kg/m    Admission Weight: Weight: 115.3 kg (254 lb 3.1 oz)  Last Weight:   Wt Readings from Last 1 Encounters:   09/17/21 115.3 kg (254 lb 3.1 oz)         Body mass index is 42.35 kg/m.  I/O:   Intake/Output  Summary (Last 24 hours) at 09/18/2021 0718  Last data filed at 09/18/2021 0646  Gross per 24 hour   Intake 2397.71 ml   Output 3805 ml   Net -1407.29 ml     Vent settings: FiO2:  [50 %-100 %] 60 %  S RR:  [22] 22  S VT:  [430 mL] 430 mL  PEEP/EPAP:  [8 cm H20] 8 cm H20    Review of Systems   Unable to perform ROS: Intubated     Physical Exam  Constitutional:       General: She is not in acute distress.     Appearance: She is obese. She is ill-appearing.      Interventions: She is sedated and intubated.   HENT:      Head: Normocephalic and atraumatic.      Mouth/Throat:      Comments: ETT in place  Eyes:      Pupils: Pupils are equal, round, and reactive to light.   Cardiovascular:      Rate and Rhythm: Normal rate and regular rhythm.      Pulses: Normal pulses.      Heart sounds: Normal heart sounds. No murmur heard.    No friction rub. No gallop.   Pulmonary:      Effort: No respiratory distress. She is intubated.      Breath sounds: No stridor. Wheezing present. No rhonchi or rales.   Chest:      Chest wall: No tenderness.   Abdominal:      General: Abdomen is flat. Bowel sounds are normal. There is no distension.      Palpations: Abdomen is soft.      Tenderness: There is no abdominal tenderness.   Musculoskeletal:         General: No swelling.      Cervical back: Neck supple.   Skin:     General: Skin is warm and dry.      Capillary Refill: Capillary refill takes less than 2 seconds.   Neurological:      Mental Status: She is disoriented.          Tubes/Lines/Airway             Patient Lines/Drains/Airways Status       Active PICC Line /  CVC Line / PIV Line / Drain / Airway / Intraosseous Line / Epidural Line / ART Line / Line / Wound / Pressure Ulcer / NG/OG Tube       Name Placement date Placement time Site Days    Peripheral IV 09/18/21 20 G Distal;Posterior;Right Forearm 09/18/21  0015  Forearm  less than 1    Peripheral IV 09/18/21 22 G Left;Posterior;Proximal Forearm 09/18/21  0415  Forearm  less than 1     Urethral Catheter 09/17/21  0245  --  1    ETT  7.5 mm 09/17/21  0600  -- 1    NG/OG Tube Nasogastric 16 Fr. Right nostril 09/17/21  0253  Right nostril  1                        Labs Reviewed:  Estimated Creatinine Clearance: 89 mL/min (based on SCr of 0.82 mg/dL).  CBC:   Recent Labs   Lab 09/18/21  0339   WBC 15.5*   Hemoglobin 14.6   Hematocrit 48.7*   PLT CT 218         Coags:   Recent Labs   Lab 09/16/21  2330   PT 10.2   PT INR 0.9         ABGs:  No results found for: ABGCOLLECTIO, ALLENSTEST, PHART, PCO2ART, PO2ART, HCO3ART, BEART, O2SATART    BE, ISTAT   Date Value Ref Range Status   09/17/2021 7 mMol/L Final   09/17/2021 6 mMol/L Final   09/17/2021  -2 - 2 mMol/L Final    `Unable to calculate result due to analyte out of the analytical measurement range     DELS, ISTAT   Date Value Ref Range Status   09/17/2021 Vent  Final   09/17/2021 Vent  Final   09/17/2021 Oxy Mask  Final     HCO3, ISTAT   Date Value Ref Range Status   09/17/2021 35.2 (H) 20.0 - 29.0 mMol/L Final   09/17/2021 38.7 (H) 20.0 - 29.0 mMol/L Final   09/17/2021  20.0 - 29.0 mMol/L Final    `Unable to calculate result due to analyte out of the analytical measurement range     O2 Sat, %, ISTAT   Date Value Ref Range Status   09/17/2021 94 (L) 96 - 100 % Final   09/17/2021 98 96 - 100 % Final   09/17/2021  96 - 100 % Final    `Unable to calculate result due to analyte out of the analytical measurement range     pH, ISTAT   Date Value Ref Range Status   09/17/2021 7.36 7.35 - 7.45 Final   09/17/2021 7.22 (L) 7.35 - 7.45 Final   09/17/2021 7.13 (LL) 7.35 - 7.45 Final     PO2, ISTAT   Date Value Ref Range Status   09/17/2021 75 75 - 100 mm Hg Final   09/17/2021 125 (H) 75 - 100 mm Hg Final   09/17/2021 92 75 - 100 mm Hg Final     Rate, ISTAT   Date Value Ref Range Status   09/17/2021 22  Final   09/17/2021 16  Final     TCO2 I-Stat   Date Value Ref Range Status   09/17/2021 37 (H) 24 - 29 mMol/L Final   09/17/2021 41 (H) 24 - 29 mMol/L Final    09/17/2021  24 - 29 mMol/L Final    `Unable to calculate result due to analyte out of  the analytical measurement range     Tidal Volume I-Stat   Date Value Ref Range Status   09/17/2021 430  Final   09/17/2021 550  Final     PCO2, ISTAT   Date Value Ref Range Status   09/17/2021 62.9 (HH) 35.0 - 45.0 mm Hg Final   09/17/2021 93.5 (HH) 35.0 - 45.0 mm Hg Final   09/17/2021 >118.0 (HH) 35.0 - 45.0 mm Hg Final     i-STAT FIO2   Date Value Ref Range Status   09/17/2021 50.00 % Final   09/17/2021 40.00 % Final   09/17/2021 50 % Final     i-STAT Mode   Date Value Ref Range Status   09/17/2021 AC  Final   09/17/2021 AC  Final     i-STAT Peep   Date Value Ref Range Status   09/17/2021 8  Final   09/17/2021 10  Final     i-STAT Pressure Support   Date Value Ref Range Status   09/17/2021 0  Final   09/17/2021 0  Final     Lactic Acid I-Stat   Date Value Ref Range Status   09/17/2021 0.8 0.5 - 1.9 mMol/L Final   09/17/2021 1.0 0.5 - 1.9 mMol/L Final    Chemistry:   Recent Labs     09/18/21  0339   Sodium 136   Potassium 4.3   Chloride 99   CO2 30   BUN 16   Creatinine 0.82   EGFR 80   Glucose 171*   Calcium 8.9   Magnesium 2.1   Phosphorus 2.8       LFTs:   Recent Labs   Lab 09/18/21  0339   Albumin 2.9*   Protein, Total 5.6*   Bilirubin, Total 0.4   Alkaline Phosphatase 63   ALT 100*   AST (SGOT) 21   Glucose 171*                Other:    RADIOLOGY / IMAGING   Imaging personally reviewed by me, including: CXR: Personally reviewed by me.     Nutrition assessment done in collaboration with Registered Dietitians:       ATTESTATION & BILLING      Patient's condition and plan discussed with: family, bedside nurse, and pharmacy    Billing:  Critical care time: 40 minutes    I managed/supervised life or organ supporting interventions that required frequent physician assessments. I devoted my full attention in the ICU to the direct care of this patient for this period of time while critically ill.    Any critical care time was  performed today and is exclusive of teaching, billable procedures, and not overlapping with any other providers.    Signed by: Karle Barr, MD   cc:Pcp, Kathreen Cosier, MD

## 2021-09-18 NOTE — Progress Notes (Signed)
[facility] Physician - Brief Progress Note   PERMANENT   09/18/2021 21:24      [facility]   [facility] [place] - CCU 3 - 12 - [address], [place] [facility])      [place], [place]      Date of Service 09/18/2021 21:24      HPI/Events of Note [facility] Provider Intervention Note      Notified by RN of ongoing pain. Patient declined her fentanyl dosing. Pending swallow  evaluation in AM. Will administer single dose of IV acetaminophen.     Contact [facility] for any needs if bedside physician is not present.         Interventions Minor-Routine modifications to care plan (e.g. PRN medications for pain,  fever)           Electronically Signed by: Donny Pique (MD) on 09/18/2021 21:28

## 2021-09-18 NOTE — Progress Notes (Signed)
[facility] Physician - Brief Progress Note   PERMANENT   09/18/2021 01:32      [facility]   [facility] [place] - CCU 3 - 12 - [address], [place] [facility])      [place], [place]      Date of Service 09/18/2021 01:32      HPI/Events of Note Pt with sinus bradycardia so precedex has been stopped. Remains on low  dose propofol and prn fentanyl.  Pt gets agitated with nursing care, stimulation, added prn versed for these episodes         Interventions Intermediate-Other: sedation   Minor-Communication with other healthcare providers and/or family            Electronically Signed by: Emeline Darling (MD) on 09/18/2021 01:33

## 2021-09-18 NOTE — Progress Notes (Signed)
Nutrition Note:      Height:  1.65 m (5' 4.96")  Weight:  115.3 kg (254 lb 3.1 oz)  BMI:  Body mass index is 42.35 kg/m.    Patient meets criteria for Morbid Obesity with BMI > 40.  Therapeutic inpatient diet is calorie controlled.  Education and resources for [employer] management/healthy eating provided.    Ulice Dash, RD  09/18/2021 5:41 AM

## 2021-09-18 NOTE — Plan of Care (Signed)
NURSE NOTE SUMMARY  [facility] - CRITICAL CARE 3   Patient Name: [name] [name],[name] [name]   Attending Physician: Lajuan Lines, MD   Today's date:   09/18/2021 LOS: 1 days   Shift Summary:                                                              2050: Patient complaining of 8/10 neck pain. Repositioned and called AICU. Patient does not want to take PRN fentanyl and is still drowsy. Has tylenol ordered but currently NPO. Requested IV tylenol.     Provider Notifications:        Rapid Response Notifications:  Mobility:      PMP Activity: Step 3 - Bed Mobility (09/18/2021  7:40 AM)     Weight tracking:  Family Dynamic:   Last 3 Weights for the past 72 hrs (Last 3 readings):   Weight   09/17/21 0630 115.3 kg (254 lb 3.1 oz)             Last Bowel Movement   Last BM Date:  (PTA)        Problem: Compromised Tissue integrity  Goal: Damaged tissue is healing and protected  Outcome: Progressing  Flowsheets (Taken 09/17/2021 2057 by Murrell Redden, RN)  Damaged tissue is healing and protected:   Monitor/assess Braden scale every shift   Provide wound care per wound care algorithm   Reposition patient every 2 hours and as needed unless able to reposition self   Increase activity as tolerated/progressive mobility   Relieve pressure to bony prominences for patients at moderate and high risk   Avoid shearing injuries   Keep intact skin clean and dry   Use bath wipes, not soap and water, for daily bathing   Use incontinence wipes for cleaning urine, stool and caustic drainage. Foley care as needed   Monitor external devices/tubes for correct placement to prevent pressure, friction and shearing     Problem: Moderate/High Fall Risk Score >5  Goal: Patient will remain free of falls  Outcome: Progressing  Flowsheets  Taken 09/18/2021 0740 by Pecolia Ades, RN  VH Moderate Risk (6-13):   ALL REQUIRED LOW INTERVENTIONS   INITIATE YELLOW "FALL RISK" SIGNAGE   YELLOW NON-SKID SLIPPERS   YELLOW "FALL RISK"  ARM BAND  Taken 09/17/2021 1940 by Murrell Redden, RN  VH High Risk (Greater than 13):   ALL REQUIRED LOW INTERVENTIONS   RED "HIGH FALL RISK" SIGNAGE   ALL REQUIRED MODERATE INTERVENTIONS   BED ALARM WILL BE ACTIVATED WHEN THE PATEINT IS IN BED WITH SIGNAGE "RESET BED ALARM"   PATIENT IS TO BE SUPERVISED FOR ALL TOILETING ACTIVITIES   Keep door open for better visibility   Use assistive devices     Problem: Inadequate Gas Exchange  Goal: Adequate oxygenation and improved ventilation  Outcome: Progressing  Flowsheets (Taken 09/17/2021 2057 by Murrell Redden, RN)  Adequate oxygenation and improved ventilation:   Assess lung sounds   Monitor SpO2 and treat as needed   Monitor and treat ETCO2   Provide mechanical and oxygen support to facilitate gas exchange   Position for maximum ventilatory efficiency   Plan activities to conserve energy: plan rest periods   Increase activity as tolerated/progressive mobility   Consult/collaborate with  Respiratory Therapy  Goal: Patent Airway maintained  Outcome: Progressing  Flowsheets (Taken 09/17/2021 1247 by Adelene Amas, RN)  Patent airway maintained:   Position patient for maximum ventilatory efficiency   Provide adequate fluid intake to liquefy secretions   Reinforce use of ordered respiratory interventions (i.e. CPAP, BiPAP, Incentive Spirometer, Acapella, etc.)   Suction secretions as needed   Reposition patient every 2 hours and as needed unless able to self-reposition

## 2021-09-18 NOTE — Progress Notes (Signed)
NURSE NOTE SUMMARY  [facility] - CRITICAL CARE 3   Patient Name: Glendinning,Trenese   Attending Physician: Lajuan Lines, MD   Today's date:   09/18/2021 LOS: 1 days   Shift Summary:                                                              Pt arrives to unit on prop 15 dex 0.5 and NS 75.  - Pt started to get agitated and received fent bolus (see MAR).   -Pt hr dropped to 30s. - Dex was stopped. AICU notified.   -AICU ordered 2 versed for agitation.      Provider Notifications:        Rapid Response Notifications:  Mobility:      PMP Activity: Step 3 - Bed Mobility (09/17/2021  7:40 PM)     Weight tracking:  Family Dynamic:   Last 3 Weights for the past 72 hrs (Last 3 readings):   Weight   09/17/21 0630 115.3 kg (254 lb 3.1 oz)             Last Bowel Movement   Last BM Date:  (PTA)

## 2021-09-18 NOTE — Respiratory Progress Note (Signed)
RESPIRATORY SERVICES CARE PLAN   Patient Name: [name] [name],[name] [name]   Attending Physician: Lajuan Lines, MD   Today's date:    09/18/2021 LOS: 1 days   CURRENT THERAPY  WEANING ATTEMPT    Mechanical Ventilation    Patient is actively being weaned, progressing, and tolerating well this shift.   OXYGEN THERAPY INCREASED REQUREMENTS THIS SHIFT   SpO2: 91 % (09/18/2021  2:45 AM)  O2 Device: Vent (09/18/2021 12:45 AM)  FiO2: 60 % (09/18/2021  2:45 AM)  O2 Flow Rate (L/min): 8 L/min (09/17/2021  1:10 AM)                LAST ABG AIRWAY   pH  7.36  (10/17 0634)  PO2  75 mm Hg (10/17 0634)  PCO2  62.9* mm Hg (10/17 0634)  HCO3  35.2* mMol/L (10/17 0634)  O2 Sat %  94* % (10/17 0634) ETT  7.5 mm (Active)   Secured at (cm) 25 cm 09/18/21 0045   Measured From Lips 09/18/21 0045   Secured Location Right 09/18/21 0045   Secured by Commercial tube holder 09/18/21 0045   Site Condition Dry 09/18/21 0045   Number of days: 1      RECOMMENDATIONS/COMMENTS:

## 2021-09-19 ENCOUNTER — Inpatient Hospital Stay: Payer: Medicare (Managed Care)

## 2021-09-19 ENCOUNTER — Other Ambulatory Visit: Payer: Self-pay | Admitting: *Deleted

## 2021-09-19 ENCOUNTER — Telehealth: Payer: Self-pay | Admitting: *Deleted

## 2021-09-19 LAB — HEPATIC FUNCTION PANEL
ALT: 78 U/L — ABNORMAL HIGH (ref 0–55)
AST (SGOT): 18 U/L (ref 10–42)
Albumin/Globulin Ratio: 1.04 Ratio (ref 0.80–2.00)
Albumin: 2.9 gm/dL — ABNORMAL LOW (ref 3.5–5.0)
Alkaline Phosphatase: 61 U/L (ref 40–145)
Bilirubin Direct: 0.3 mg/dL (ref 0.0–0.3)
Bilirubin, Total: 0.6 mg/dL (ref 0.1–1.2)
Globulin: 2.8 gm/dL (ref 2.0–4.0)
Protein, Total: 5.7 gm/dL — ABNORMAL LOW (ref 6.0–8.3)

## 2021-09-19 LAB — BASIC METABOLIC PANEL
Anion Gap: 10.6 mMol/L (ref 7.0–18.0)
Anion Gap: 17.2 mMol/L (ref 7.0–18.0)
BUN / Creatinine Ratio: 25 Ratio (ref 10.0–30.0)
BUN / Creatinine Ratio: 27.1 Ratio (ref 10.0–30.0)
BUN: 16 mg/dL (ref 7–22)
BUN: 19 mg/dL (ref 7–22)
CO2: 30 mMol/L (ref 20–30)
CO2: 27 mMol/L (ref 20–30)
Calcium: 8.2 mg/dL — ABNORMAL LOW (ref 8.5–10.5)
Calcium: 8.6 mg/dL (ref 8.5–10.5)
Chloride: 105 mMol/L (ref 98–110)
Chloride: 102 mMol/L (ref 98–110)
Creatinine: 0.64 mg/dL (ref 0.60–1.20)
Creatinine: 0.7 mg/dL (ref 0.60–1.20)
EGFR: 99 mL/min/{1.73_m2} (ref 60–150)
EGFR: 97 mL/min/{1.73_m2} (ref 60–150)
Glucose: 135 mg/dL — ABNORMAL HIGH (ref 71–99)
Glucose: 173 mg/dL — ABNORMAL HIGH (ref 71–99)
Osmolality Calculated: 286 mOsm/kg (ref 275–300)
Osmolality Calculated: 290 mOsm/kg (ref 275–300)
Potassium: 3.6 mMol/L (ref 3.5–5.3)
Potassium: 4.2 mMol/L (ref 3.5–5.3)
Sodium: 142 mMol/L (ref 136–147)
Sodium: 142 mMol/L (ref 136–147)

## 2021-09-19 LAB — CBC WITH MANUAL DIFFERENTIAL
Basophils %: 0 % (ref 0.0–3.0)
Basophils Absolute: 0 10*3/uL (ref 0.0–0.3)
Eosinophils %: 0 % (ref 0.0–7.0)
Eosinophils Absolute: 0 10*3/uL (ref 0.0–0.8)
Hematocrit: 48 % (ref 36.0–48.0)
Hemoglobin: 14.2 gm/dL (ref 12.0–16.0)
Lymphocytes Absolute: 0.8 10*3/uL (ref 0.6–5.1)
Lymphocytes: 6 % — ABNORMAL LOW (ref 15.0–46.0)
MCH: 29 pg (ref 28–35)
MCHC: 30 gm/dL — ABNORMAL LOW (ref 32–36)
MCV: 99 fL (ref 80–100)
MPV: 7.5 fL (ref 6.0–10.0)
Monocytes Absolute: 0.1 10*3/uL (ref 0.1–1.7)
Monocytes: 1 % — ABNORMAL LOW (ref 3.0–15.0)
Neutrophils %: 93 % — ABNORMAL HIGH (ref 42.0–78.0)
Neutrophils Absolute: 12.9 10*3/uL — ABNORMAL HIGH (ref 1.7–8.6)
PLT CT: 217 10*3/uL (ref 130–440)
RBC: 4.86 10*6/uL (ref 3.80–5.00)
RDW: 13.9 % (ref 11.0–14.0)
WBC: 13.9 10*3/uL — ABNORMAL HIGH (ref 4.0–11.0)

## 2021-09-19 LAB — CALCIUM, IONIZED: Calcium, Ionized: 4.17 mg/dL — ABNORMAL LOW (ref 4.35–5.10)

## 2021-09-19 LAB — PHOSPHORUS: Phosphorus: 3.2 mg/dL (ref 2.3–4.7)

## 2021-09-19 LAB — MAGNESIUM: Magnesium: 2.3 mg/dL (ref 1.6–2.6)

## 2021-09-19 MED ORDER — CALCIUM GLUCONATE-NACL 1-0.675 GM/50ML-% IV SOLN
1.0000 g | Freq: Once | INTRAVENOUS | Status: AC
Start: 2021-09-19 — End: 2021-09-19
  Administered 2021-09-19: 06:00:00 1 g via INTRAVENOUS
  Filled 2021-09-19: qty 50

## 2021-09-19 MED ORDER — ACETAMINOPHEN 325 MG PO TABS
650.0000 mg | ORAL_TABLET | Freq: Four times a day (QID) | ORAL | Status: DC | PRN
Start: 2021-09-19 — End: 2021-09-20
  Administered 2021-09-19: 650 mg via ORAL
  Filled 2021-09-19: qty 2

## 2021-09-19 MED ORDER — POTASSIUM CHLORIDE 20 MEQ/15ML (10%) PO SOLN
20.0000 meq | Freq: Once | ORAL | Status: AC
Start: 2021-09-19 — End: 2021-09-19
  Administered 2021-09-19: 09:00:00 20 meq via ORAL
  Filled 2021-09-19: qty 15

## 2021-09-19 MED ORDER — APIXABAN 5 MG PO TABS
5.0000 mg | ORAL_TABLET | Freq: Two times a day (BID) | ORAL | Status: DC
Start: 2021-09-26 — End: 2021-09-20

## 2021-09-19 MED ORDER — RISPERIDONE 0.5 MG PO TABS
2.0000 mg | ORAL_TABLET | Freq: Two times a day (BID) | ORAL | Status: DC
Start: 2021-09-19 — End: 2021-09-20
  Administered 2021-09-19 – 2021-09-20 (×2): 2 mg via ORAL
  Filled 2021-09-19 (×2): qty 4

## 2021-09-19 MED ORDER — HYDROXYZINE HCL 10 MG PO TABS
10.0000 mg | ORAL_TABLET | Freq: Three times a day (TID) | ORAL | Status: DC | PRN
Start: 2021-09-19 — End: 2021-09-20
  Administered 2021-09-19: 21:00:00 10 mg via ORAL
  Filled 2021-09-19: qty 1

## 2021-09-19 MED ORDER — APIXABAN 5 MG PO TABS
10.0000 mg | ORAL_TABLET | Freq: Two times a day (BID) | ORAL | Status: DC
Start: 2021-09-19 — End: 2021-09-20
  Administered 2021-09-19 – 2021-09-20 (×3): 10 mg via ORAL
  Filled 2021-09-19 (×3): qty 2

## 2021-09-19 MED ORDER — VH BIO-K PLUS PROBIOTIC 50 BIL CFU CAPSULE
50.0000 | DELAYED_RELEASE_CAPSULE | Freq: Every day | ORAL | Status: DC
Start: 2021-09-19 — End: 2021-09-20
  Administered 2021-09-19 – 2021-09-20 (×2): 50 via ORAL
  Filled 2021-09-19 (×2): qty 1

## 2021-09-19 MED ORDER — GABAPENTIN 300 MG PO CAPS
300.0000 mg | ORAL_CAPSULE | Freq: Three times a day (TID) | ORAL | Status: DC
Start: 2021-09-19 — End: 2021-09-20
  Administered 2021-09-19 – 2021-09-20 (×4): 300 mg via ORAL
  Filled 2021-09-19 (×4): qty 1

## 2021-09-19 MED ORDER — POTASSIUM CHLORIDE 10 MEQ/100ML IV SOLN
10.0000 meq | INTRAVENOUS | Status: DC
Start: 2021-09-19 — End: 2021-09-19
  Administered 2021-09-19 (×2): 10 meq via INTRAVENOUS
  Filled 2021-09-19 (×2): qty 100

## 2021-09-19 MED ORDER — ROSUVASTATIN CALCIUM 5 MG PO TABS
5.0000 mg | ORAL_TABLET | Freq: Every evening | ORAL | Status: DC
Start: 2021-09-19 — End: 2021-09-20
  Administered 2021-09-19: 21:00:00 5 mg via ORAL
  Filled 2021-09-19: qty 1

## 2021-09-19 MED ORDER — VH DOXYCYCLINE 100 MG PO (WRAP)
100.0000 mg | ORAL_CAPSULE | Freq: Two times a day (BID) | ORAL | Status: DC
Start: 2021-09-19 — End: 2021-09-20
  Administered 2021-09-19 – 2021-09-20 (×3): 100 mg via ORAL
  Filled 2021-09-19 (×3): qty 1

## 2021-09-19 NOTE — Patient Outreach (Signed)
[facility]) Care Management  09/19/2021  Jasmine Werner 1956/03/12 [id number]   CSW called pt for follow up and her daughter, Deneise Lever, answered. Per daughter, pt is currently in the hospital in [place], [place]- pt was returning home from trip with her brother in [place] ([place]) and had a COPD exacerbation requiring intubation. Pt was hospitalized Sunday, 09/16/21 and was extubated 09/18/21. She is doing well, per daughter- mental state is good should transfer out of ICU today and home soon.   CSW stressed to daughter the need for pt to use her Oxygen as ordered by MD and to discuss options with PCP upon return.  Pt will be provided with an Oxygen portable tank for ride home.  CSW will plan to touch base with pt again in the next 7-10 days.   Eduard Clos, MSW, [employer] Worker  [place] [phone]

## 2021-09-19 NOTE — Progress Notes (Signed)
Medicine Progress Note   [facility]  [employer]   Patient Name: [name] [name],[name] [name] LOS: 2 days   Attending Physician: Sherlon Handing, MD PCP: Richardean Chimera, MD      Hospital Course:                                                            [name] [name] is a 65 y.o. female patient COPD on home oxygen 2 L at night, diabetes mellitus, hyperlipidemia, substance abuse, current tobacco use, depression, anxiety transferred from [facility] emergency department with altered mental status, acute hypercapnic respiratory failure, hypoglycemia and suspected drug overdose.  Patient admitted to ICU.  Patient intubated on 10/17 at [facility].  Due to COPD exacerbation along with concern of possible trazodone overdose.  CT chest obtained which showed inflammatory/infectious process.  CT chest without evidence of pulm embolism or pulm fibrosis.  Patient started on doxycycline, Solu-Medrol, inhaler treatment.  Patient's empirically with antibiotic doxycycline for 5 days.  Patient successfully extubated on 09/18/2021.  Patient continue to wean down from BiPAP to high flow to nasal cannula.  Transfer to the floor on 09/19/2021.     Assessment and Plan:      Acute on chronic hypoxic/hypercapnic respiratory failure  Acute COPD exacerbation  Leukocytosis  Possible community-acquired pneumonia  - Patient was intubated at [facility] on 10/17  - Successfully extubated on 09/18/2021 was on BiPAP and then transition to high flow  - This morning patient transition from 50% to 30% high flow nasal cannula  - We will continue to wean down oxygen  - SPO2 goal 88 to 92%  - Continue pulse ox  - Continue to wean off oxygen    Altered mental status  - Improving  - Likely signal hyperglycemia and acute hypercapnic respiratory failure along with suspected trazodone overdose  - Continue to monitor    Depression  - Continue Risperdal  - Trazodone is on hold    Anxiety  - Continue Risperdal  - Added hydroxyzine by critical care  team    Diabetes mellitus type 2  -Initially insulin was low, in the 50s  - Continue monitor blood sugar        Disposition: Inpatient, PT OT evaluation  DVT PPX: Medication VTE Prophylaxis Orders: apixaban (ELIQUIS) tablet 10 mg, apixaban (ELIQUIS) tablet 5 mg  Mechanical VTE Prophylaxis Orders: Mechanical VTE: Pneumatic Compression; Knee high  Code:  Full Code       Subjective     Subjectively feeling better, on high flow nasal cannula, mild respite distress           Objective   Physical Exam:       Vitals: T:98.8 F (37.1 C) (Oral), BP:(!) 184/100, HR:91, RR:(!) 26, SaO2:94%         General: Morbid obese, patient is awake. In mild respiratory distress.  HEENT: No conjunctival drainage, vision is intact, anicteric sclera.  Neck: Supple, no thyromegaly.  Chest: CTA bilaterally. No rhonchi, no wheezing. No use of accessory muscles.  CVS: Normal rate and regular rhythm no murmurs, without JVD, no pitting edema, pulses palpable.  Abdomen: Soft, non-tender, no guarding or rigidity, with normal bowel sounds.  Extremities: No calf swelling and no gross deformity.  Skin: Warm, dry, no rash and no worrisome lesions.  NEURO:  No motor or sensory deficits.  Psychiatric: Alert, interactive, appropriate, normal affect.    Weight Monitoring 09/14/2021 09/16/2021 09/17/2021 09/17/2021 09/17/2021 09/19/2021   Height 165.1 cm 180.3 cm - - 165 cm -   Height Method Stated Estimated - - Stated -   Weight 110.6 kg 115.667 kg 111.6 kg 115.3 kg - 101.4 kg   Weight Method - Estimated - Bed Scale - Bed Scale   BMI (calculated) 40.7 kg/m2 35.6 kg/m2 - - - -           Intake/Output Summary (Last 24 hours) at 09/19/2021 1701  Last data filed at 09/19/2021 0838  Gross per 24 hour   Intake 1509.83 ml   Output 950 ml   Net 559.83 ml     Body mass index is 37.24 kg/m.     Meds:     Current Facility-Administered Medications   Medication Dose Route Frequency    albuterol-ipratropium  3 mL Nebulization Q4H SCH    apixaban  10 mg Oral Q12H SCH     Followed by    Melene Muller ON 09/26/2021] apixaban  5 mg Oral Q12H SCH    doxycycline  100 mg Oral BID    gabapentin  300 mg Oral Q8H SCH    lactobacillus species  50 Billion CFU Oral Daily    methylPREDNISolone  40 mg Intravenous Q8H SCH    nicotine  1 patch Transdermal Daily    risperiDONE  2 mg Oral BID    rosuvastatin  5 mg Oral QHS    sodium chloride (PF)  3 mL Intravenous Q12H SCH       PRN Meds: acetaminophen, hydrOXYzine.     LABS:     Estimated Creatinine Clearance: 97 mL/min (based on SCr of 0.7 mg/dL).  Recent Labs   Lab 09/19/21  0311 09/18/21  0339   WBC 13.9* 15.5*   RBC 4.86 4.97   Hemoglobin 14.2 14.6   Hematocrit 48.0 48.7*   MCV 99 98   PLT CT 217 218     Recent Labs   Lab 09/16/21  2330   PT 10.2   PT INR 0.9     Recent Labs   Lab 09/17/21  1252 09/17/21  0917 09/17/21  0707   Troponin I 0.01 0.02 0.01     No results found for: HGBA1CPERCNT  Recent Labs   Lab 09/19/21  1313 09/19/21  0311 09/18/21  0339   Glucose 173* 135* 171*   Sodium 142 142 136   Potassium 4.2 3.6 4.3   Chloride 102 105 99   CO2 27 30 30    BUN 19 16 16    Creatinine 0.70 0.64 0.82   EGFR 97 99 80   Calcium 8.6 8.2* 8.9     Recent Labs   Lab 09/19/21  0311 09/18/21  0339   Magnesium 2.3 2.1   Phosphorus 3.2 2.8   Albumin 2.9* 2.9*   Protein, Total 5.7* 5.6*   Bilirubin, Total 0.6 0.4   Alkaline Phosphatase 61 63   ALT 78* 100*   AST (SGOT) 18 21     Recent Labs   Lab 09/17/21  0711 09/16/21  2340   Urine Specific Gravity 1.026 1.025   pH, Urine 6.0 6.0   Protein, UR 30* Trace   Glucose, UA 50* Negative   Ketones UA 5 Negative   Bilirubin, UA Negative Negative   Blood, UA Small* Trace*   Nitrite, UA Negative Positive*   Urobilinogen, UA 2.0* 0.2  Leukocyte Esterase, UA Negative Negative   WBC, UA 16* None Seen   RBC, UA 7* None Seen   Bacteria, UA  --  Rare*      Patient Lines/Drains/Airways Status       Active PICC Line / CVC Line / PIV Line / Drain / Airway / Intraosseous Line / Epidural Line / ART Line / Line / Wound /  Pressure Ulcer / NG/OG Tube       Name Placement date Placement time Site Days    Peripheral IV 09/18/21 20 G Distal;Posterior;Right Forearm 09/18/21  0015  Forearm  1    Peripheral IV 09/18/21 22 G Posterior;Right Wrist 09/18/21  2136  Wrist  less than 1    External Urinary Catheter 09/18/21  1618  --  1                   CT Head WO- (Rad read)    Result Date: 09/17/2021  1. Faint 8 mm high attenuation medially in the medial right temporal lobe is nonspecific. This could represent calcification or small focal area of hemorrhage. Continued follow-up is recommended. 2. Otherwise no acute changes are identified. Result was called by myself at 0104 hours ReadingStation:[email or web address]    CT Angiogram Chest    Result Date: 09/18/2021  1. No pulmonary edema identified. Evaluation of the subsegmental pulmonary arteries is slightly limited by respiratory motion artifact. 2. There are a few small right-sided pulmonary nodules. These are suspected to represent an atypical infectious or inflammatory process. Follow-up chest CT in 6 months is suggested if prior imaging is not available for comparison. 3. Indeterminate approximately 2 cm right adrenal nodule is present. Statistically, this likely represents an adenoma. If prior imaging is not available for comparison, further evaluation with elective adrenal mass protocol precontrast/postcontrast CT scan or MRI should be considered for definitive characterization. 4. Hepatic steatosis. ReadingStation:[email or web address]    XR Chest AP Portable    Result Date: 09/17/2021  1.  Endotracheal tube appears appropriately positioned. Nasogastric tube courses below the left hemidiaphragm and off the field of view. 2.  The lungs are hyperinflated with increased reticular markings, compatible with COPD. No superimposed consolidation is seen. ReadingStation:[email or web address]    XR Chest AP Portable    Result Date: 09/17/2021  ET tube and NG tube in good position. COPD and mild interstitial changes. Subsegmental  right suprahilar area and left base. ReadingStation:[email or web address]    XR Chest AP Portable    Result Date: 09/17/2021  Stable mild interstitial prominence that may represent pulmonary vascular congestion. ReadingStation:[email or web address]    XR Chest AP Portable    Result Date: 09/17/2021  1. Mild central vascular prominence otherwise no acute changes. ReadingStation:[email or web address]    US Venous Up Extrem Duplex Dopp Comp Bilat    Result Date: 09/19/2021  POSITIVE study. Extensive deep and superficial left venous thrombosis, primarily in the left forearm, as described above. Findings relayed to [facility], RN by the sonographer. Long segment 14 cm occlusive superficial venous thrombosis of the mid to distal right cephalic vein. No deep vein thrombosis of right upper extremity. ReadingStation:[email or web address]    Home Health Needs:  There are no questions and answers to display.       Nutrition assessment done in collaboration with Registered Dietitians:     Time spent:      Sherlon Handing, MD     09/19/21,5:01 PM   MRN: [id number]  CSN: [id number] DOB: Sep 13, 1956

## 2021-09-19 NOTE — Plan of Care (Signed)
Problem: Compromised Tissue integrity  Goal: Damaged tissue is healing and protected  Outcome: Progressing  Goal: Nutritional status is improving  Outcome: Progressing     Problem: Moderate/High Fall Risk Score >5  Goal: Patient will remain free of falls  Outcome: Progressing     Problem: Non-Violent Restraints Interdisciplinary Plan  Goal: Will be injury free during the use of non-violent restraints  Outcome: Progressing     Problem: Violent or Self-destructive Restraints Interdisciplinary Plan  Goal: Will be injury free during the use of restraints  Outcome: Progressing     Problem: Inadequate Gas Exchange  Goal: Adequate oxygenation and improved ventilation  Outcome: Progressing  Goal: Patent Airway maintained  Outcome: Progressing     Problem: Inadequate Airway Clearance  Goal: Normal respiratory rate/effort achieved/maintained  Outcome: Progressing     Problem: Artificial Airway  Goal: Endotracheal tube will be maintained  Outcome: Progressing  Goal: Tracheostomy will be maintained  Outcome: Progressing

## 2021-09-19 NOTE — Progress Notes (Signed)
[facility]  Progress Note    Patient Name: Kalama,Fahmida    Attending Physician: Lajuan Lines, MD                                             LOS:  2 DAYS   Primary Care Physician: Richardean Chimera, MD        ROOM#: [id number]/[id number]-A                                      Assessment     55 YOF with COPD on home O2 that is admitted for AMS and AECOPD requiring intubation and mechanical ventilation. Found to be hypercapneic and hypoglycemic.       Interval Events / ICU Course    10/17: Admitted to ICU  10/18: Difficulty with sedation.  Required Versed pushes overnight.  Increased FiO2 requirements due to agitation  10/19: Extubated yesterday directly to BiPAP.  Remains on high flow nasal cannula, 45% FiO2 40 L/min      Assessment & Plan    Patient Active Hospital Problem List:  Cardiovascular and Mediastinum  Hyperlipidemia  Assessment & Plan  A/P: Continue statin    Respiratory  Acute on chronic respiratory failure with hypoxia and hypercapnia  Assessment & Plan  A/P: Due to acute exacerbation of COPD, also concern for possible trazodone overdose.  Some nodules on CT suggestive of inflammatory/infectious process.  Being covered for CAP Which I think is appropriate for the time being.    -Wean FiO2 to maintain saturation between 88 and 92%  -Follow blood and sputum cxs  -[place] ceftriaxone, continue doxycycline for 5 days  -Continue Solu-Medrol 40 mg every 8 hours  -Bronchodilators ATC  -CT chest without evidence of pulmonary embolism or pulmonary fibrosis.  -Needs to quit smoking  -[place]      Chronic obstructive pulmonary disease with acute exacerbation  Assessment & Plan  A/P: Likely predominant driver of her acute hypoxic respiratory failure and delirium  -Continue Solu-Medrol to 40 mg every 8 hours  -Continue standing DuoNebs  -Titrate supplemental oxygen to maintain saturation between 88 and 92%  -Doxycycline to cover atypical infections  -Sputum  cultures pending    Endocrine  Diabetes mellitus  Assessment & Plan  A/P: Presents to the OSH with blood glucose in the 50s with mild transient response in mental status after D50.  -Hold home oral agents for now  -Monitor electrolytes   -Monitor BG      Other  Leukocytosis, unspecified type  Assessment & Plan  A/P: Possibly secondary to CAP.  WBC 21.  Scattered nodules seen on CT chest  -Narrow coverage to doxycycline only.  Discontinue ceftriaxone  -Continue trend white count        Depression  Assessment & Plan  A/P:   -Home trazodone on hold  -Continue Risperdal      Anxiety  Assessment & Plan  A/P: Daughter reports severe anxiety at baseline  -Risperdal increased  -Add hydroxyzine today    * AMS (altered mental status)  Assessment & Plan  A/P: Likely secondary to hypoglycemia, acute hypercapnic respiratory failure, plus or minus trazodone overdose.   -Extubated yesterday, mentating appropriately  -Reports ongoing anxiety: Respite all was increased, will  add hydroxyzine today                General ICU Assessment / Plans - if applicable    Vascular access: Peripherals.  GI Prophylaxis: none needed  Nutrition: cardiac diet  Sedation: None needed  Foley Catheter: discontinue  VTE Prophylaxis: Medication VTE Prophylaxis Orders: enoxaparin (LOVENOX) syringe 40 mg  Mechanical VTE Prophylaxis Orders: Mechanical VTE: Pneumatic Compression; Knee high    Other:  Code Status: Full Code      HISTORY / Subjective      Feels good today.  Having anxiety.  Shortness of breath improving.      PHYSICAL EXAM   Vitals: BP 155/76    Pulse 94    Temp 98.9 F (37.2 C) (Oral)    Resp (!) 29    Ht 1.65 m (5' 4.96")    Wt 101.4 kg (223 lb 8.7 oz)    LMP  (LMP Unknown)    SpO2 93%    BMI 37.24 kg/m    Admission Weight: Weight: 115.3 kg (254 lb 3.1 oz)  Last Weight:   Wt Readings from Last 1 Encounters:   09/19/21 101.4 kg (223 lb 8.7 oz)         Body mass index is 37.24 kg/m.  I/O:   Intake/Output Summary (Last 24 hours) at 09/19/2021  0740  Last data filed at 09/19/2021 0701  Gross per 24 hour   Intake 2244.28 ml   Output 3690 ml   Net -1445.72 ml       Vent settings: FiO2:  [40 %-60 %] 50 %  S RR:  [22] 22  S VT:  [430 mL] 430 mL  PEEP/EPAP:  [8 cm H20] 8 cm H20    Review of Systems   Constitutional:  Negative for chills and fever.   Respiratory:  Positive for cough and shortness of breath. Negative for hemoptysis, sputum production and wheezing.    Psychiatric/Behavioral:  The patient is nervous/anxious.      Physical Exam  Constitutional:       Appearance: Normal appearance. She is obese. She is not ill-appearing.   Cardiovascular:      Rate and Rhythm: Normal rate and regular rhythm.      Pulses: Normal pulses.      Heart sounds: Normal heart sounds. No murmur heard.    No friction rub. No gallop.   Pulmonary:      Effort: Pulmonary effort is normal. No respiratory distress.      Breath sounds: Normal breath sounds. No wheezing, rhonchi or rales.   Musculoskeletal:         General: No swelling.      Right lower leg: No edema.      Left lower leg: No edema.   Neurological:      Mental Status: She is alert.          Tubes/Lines/Airway             Patient Lines/Drains/Airways Status       Active PICC Line / CVC Line / PIV Line / Drain / Airway / Intraosseous Line / Epidural Line / ART Line / Line / Wound / Pressure Ulcer / NG/OG Tube       Name Placement date Placement time Site Days    Peripheral IV 09/18/21 20 G Distal;Posterior;Right Forearm 09/18/21  0015  Forearm  less than 1    Peripheral IV 09/18/21 22 G Left;Posterior;Proximal Forearm 09/18/21  0415  Forearm  less than  1    Urethral Catheter 09/17/21  0245  --  1    ETT  7.5 mm 09/17/21  0600  -- 1    NG/OG Tube Nasogastric 16 Fr. Right nostril 09/17/21  0253  Right nostril  1                        Labs Reviewed:  Estimated Creatinine Clearance: 106.1 mL/min (based on SCr of 0.64 mg/dL).  CBC:   Recent Labs   Lab 09/19/21  0311   WBC 13.9*   Hemoglobin 14.2   Hematocrit 48.0   PLT CT  217           Coags:   Recent Labs   Lab 09/16/21  2330   PT 10.2   PT INR 0.9           ABGs:  No results found for: ABGCOLLECTIO, ALLENSTEST, PHART, PCO2ART, PO2ART, HCO3ART, BEART, O2SATART    BE, ISTAT   Date Value Ref Range Status   09/18/2021 7 mMol/L Final   09/18/2021 8 mMol/L Final   09/17/2021 7 mMol/L Final     DELS, ISTAT   Date Value Ref Range Status   09/18/2021 Vent  Final   09/18/2021 Vent  Final   09/17/2021 Vent  Final     HCO3, ISTAT   Date Value Ref Range Status   09/18/2021 32.7 (H) 20.0 - 29.0 mMol/L Final   09/18/2021 33.6 (H) 20.0 - 29.0 mMol/L Final   09/17/2021 35.2 (H) 20.0 - 29.0 mMol/L Final     O2 Sat, %, ISTAT   Date Value Ref Range Status   09/18/2021 88 (L) 96 - 100 % Final   09/18/2021 83 (L) 96 - 100 % Final   09/17/2021 94 (L) 96 - 100 % Final     pH, ISTAT   Date Value Ref Range Status   09/18/2021 7.45 7.35 - 7.45 Final   09/18/2021 7.47 (H) 7.35 - 7.45 Final   09/17/2021 7.36 7.35 - 7.45 Final     PO2, ISTAT   Date Value Ref Range Status   09/18/2021 53 (L) 75 - 100 mm Hg Final   09/18/2021 45 (LL) 75 - 100 mm Hg Final   09/17/2021 75 75 - 100 mm Hg Final     Rate, ISTAT   Date Value Ref Range Status   09/18/2021 0  Final   09/18/2021 22  Final   09/17/2021 22  Final     TCO2 I-Stat   Date Value Ref Range Status   09/18/2021 34 (H) 24 - 29 mMol/L Final   09/18/2021 35 (H) 24 - 29 mMol/L Final   09/17/2021 37 (H) 24 - 29 mMol/L Final     Tidal Volume I-Stat   Date Value Ref Range Status   09/18/2021 0  Final   09/18/2021 420  Final   09/17/2021 430  Final     PCO2, ISTAT   Date Value Ref Range Status   09/18/2021 47.5 (H) 35.0 - 45.0 mm Hg Final   09/18/2021 46.7 (H) 35.0 - 45.0 mm Hg Final   09/17/2021 62.9 (HH) 35.0 - 45.0 mm Hg Final     i-STAT FIO2   Date Value Ref Range Status   09/18/2021 40.00 % Final   09/18/2021 40.00 % Final   09/17/2021 50.00 % Final     i-STAT Mode   Date Value Ref Range Status   09/18/2021 PSV  Final  09/18/2021 AC  Final   09/17/2021 AC  Final      i-STAT Peep   Date Value Ref Range Status   09/18/2021 8  Final   09/18/2021 8  Final   09/17/2021 8  Final     i-STAT Pressure Support   Date Value Ref Range Status   09/18/2021 5  Final   09/18/2021 0  Final   09/17/2021 0  Final     Lactic Acid I-Stat   Date Value Ref Range Status   09/17/2021 0.8 0.5 - 1.9 mMol/L Final   09/17/2021 1.0 0.5 - 1.9 mMol/L Final    Chemistry:   Recent Labs     09/19/21  0311   Sodium 142   Potassium 3.6   Chloride 105   CO2 30   BUN 16   Creatinine 0.64   EGFR 99   Glucose 135*   Calcium 8.2*   Magnesium 2.3   Phosphorus 3.2         LFTs:   Recent Labs   Lab 09/19/21  0311   Albumin 2.9*   Protein, Total 5.7*   Bilirubin, Total 0.6   Alkaline Phosphatase 61   ALT 78*   AST (SGOT) 18   Glucose 135*                  Other:    RADIOLOGY / IMAGING   Imaging personally reviewed by me, including: CXR: Personally reviewed by me.     Nutrition assessment done in collaboration with Registered Dietitians:       ATTESTATION & BILLING      Patient's condition and plan discussed with: family, bedside nurse, and pharmacy    Billing:  [phone]    I managed/supervised life or organ supporting interventions that required frequent physician assessments. I devoted my full attention in the ICU to the direct care of this patient for this period of time while critically ill.    Any critical care time was performed today and is exclusive of teaching, billable procedures, and not overlapping with any other providers.    Signed by: Karle Barr, MD   UE:[id number], Lucita Lora, MD

## 2021-09-19 NOTE — Progress Notes (Signed)
Quick Doc  [facility] - CRITICAL CARE 3   Patient Name: [facility]   Attending Physician: Sherlon Handing, MD   Today's date:   09/19/2021 LOS: 2 days   Expected Discharge Date      Quick  Assessment:                                                              ReAdmit Risk Score: 19.16    CM Comments: 09/19/2021 RNCM (AP) COPD exacerbation. Extubated to HF O2 40%;40L/min. Nebs continue. Patient is from [place], [place] and hopes to be able to go home Friday when her daughter has to return. But if unable, dtr indicated there would be someone available to come and get pt. to return home. Pt. uses home O2 at night via [employer]/[place], [place]. CM in process of confirming if [employer] is O2 provider. Pt. will need a home O2 eval for daytime/portable O2 within 24-48 hrs of [facility] readiness. Plan for [place] is home to [place] once medically cleared. CM following    Physical Discharge Disposition: Home                                                                          Physical Discharge Disposition: Home       Provider Notifications:       Calla Kicks, RN, BSN  Case Manager  [phone]  [phone]: fax

## 2021-09-19 NOTE — Progress Notes (Signed)
[facility] Physician - Brief Progress Note   PERMANENT   09/19/2021 05:26      [facility]   [facility] - CCU 3 - 12 - [address], [facility])      [place], [place]      Date of Service 09/19/2021 05:26      HPI/Events of Note [facility] Provider Intervention Note      Potassium 3.6 this AM. Will replace with 10 mEq every hour for 4 hours via PIV.      Contact [facility] for any needs if bedside physician is not present.         Interventions Intermediate-Electrolyte abnormality - evaluation and management            Electronically Signed by: Donny Pique (MD) on 09/19/2021 05:27

## 2021-09-20 ENCOUNTER — Ambulatory Visit: Payer: Self-pay | Admitting: *Deleted

## 2021-09-20 DIAGNOSIS — I82409 Acute embolism and thrombosis of unspecified deep veins of unspecified lower extremity: Secondary | ICD-10-CM | POA: Diagnosis not present

## 2021-09-20 LAB — COMPREHENSIVE METABOLIC PANEL
ALT: 67 U/L — ABNORMAL HIGH (ref 0–55)
AST (SGOT): 18 U/L (ref 10–42)
Albumin/Globulin Ratio: 1.04 Ratio (ref 0.80–2.00)
Albumin: 2.9 gm/dL — ABNORMAL LOW (ref 3.5–5.0)
Alkaline Phosphatase: 53 U/L (ref 40–145)
Anion Gap: 10.3 mMol/L (ref 7.0–18.0)
BUN / Creatinine Ratio: 26.6 Ratio (ref 10.0–30.0)
BUN: 17 mg/dL (ref 7–22)
Bilirubin, Total: 0.5 mg/dL (ref 0.1–1.2)
CO2: 31 mMol/L — ABNORMAL HIGH (ref 20–30)
Calcium: 8.6 mg/dL (ref 8.5–10.5)
Chloride: 104 mMol/L (ref 98–110)
Creatinine: 0.64 mg/dL (ref 0.60–1.20)
EGFR: 99 mL/min/{1.73_m2} (ref 60–150)
Globulin: 2.8 gm/dL (ref 2.0–4.0)
Glucose: 153 mg/dL — ABNORMAL HIGH (ref 71–99)
Osmolality Calculated: 286 mOsm/kg (ref 275–300)
Potassium: 4.3 mMol/L (ref 3.5–5.3)
Protein, Total: 5.7 gm/dL — ABNORMAL LOW (ref 6.0–8.3)
Sodium: 141 mMol/L (ref 136–147)

## 2021-09-20 LAB — HEPATIC FUNCTION PANEL
ALT: 67 U/L — ABNORMAL HIGH (ref 0–55)
AST (SGOT): 18 U/L (ref 10–42)
Albumin/Globulin Ratio: 1.04 Ratio (ref 0.80–2.00)
Albumin: 2.9 gm/dL — ABNORMAL LOW (ref 3.5–5.0)
Alkaline Phosphatase: 53 U/L (ref 40–145)
Bilirubin Direct: 0.2 mg/dL (ref 0.0–0.3)
Bilirubin, Total: 0.5 mg/dL (ref 0.1–1.2)
Globulin: 2.8 gm/dL (ref 2.0–4.0)
Protein, Total: 5.7 gm/dL — ABNORMAL LOW (ref 6.0–8.3)

## 2021-09-20 LAB — CBC AND DIFFERENTIAL
Basophils %: 0.1 % (ref 0.0–3.0)
Basophils Absolute: 0 10*3/uL (ref 0.0–0.3)
Eosinophils %: 0.1 % (ref 0.0–7.0)
Eosinophils Absolute: 0 10*3/uL (ref 0.0–0.8)
Hematocrit: 49.1 % — ABNORMAL HIGH (ref 36.0–48.0)
Hemoglobin: 14.2 gm/dL (ref 12.0–16.0)
Lymphocytes Absolute: 1.1 10*3/uL (ref 0.6–5.1)
Lymphocytes: 8.5 % — ABNORMAL LOW (ref 15.0–46.0)
MCH: 29 pg (ref 28–35)
MCHC: 29 gm/dL — ABNORMAL LOW (ref 32–36)
MCV: 100 fL (ref 80–100)
MPV: 7.7 fL (ref 6.0–10.0)
Monocytes Absolute: 0.6 10*3/uL (ref 0.1–1.7)
Monocytes: 4.6 % (ref 3.0–15.0)
Neutrophils %: 86.6 % — ABNORMAL HIGH (ref 42.0–78.0)
Neutrophils Absolute: 11.4 10*3/uL — ABNORMAL HIGH (ref 1.7–8.6)
PLT CT: 199 10*3/uL (ref 130–440)
RBC: 4.91 10*6/uL (ref 3.80–5.00)
RDW: 14 % (ref 11.0–14.0)
WBC: 13.2 10*3/uL — ABNORMAL HIGH (ref 4.0–11.0)

## 2021-09-20 LAB — VH CULTURE AND SMEAR, RESPIRATORY: Culture Result: NO GROWTH

## 2021-09-20 LAB — MAGNESIUM: Magnesium: 2.4 mg/dL (ref 1.6–2.6)

## 2021-09-20 LAB — PHOSPHORUS: Phosphorus: 4.1 mg/dL (ref 2.3–4.7)

## 2021-09-20 LAB — CALCIUM, IONIZED: Calcium, Ionized: 4.46 mg/dL (ref 4.35–5.10)

## 2021-09-20 MED ORDER — PREDNISONE 20 MG PO TABS
40.0000 mg | ORAL_TABLET | Freq: Every day | ORAL | 0 refills | Status: AC
Start: 2021-09-20 — End: 2021-09-25

## 2021-09-20 MED ORDER — DOXYCYCLINE HYCLATE 100 MG PO CAPS
100.0000 mg | ORAL_CAPSULE | Freq: Two times a day (BID) | ORAL | 0 refills | Status: AC
Start: 2021-09-20 — End: 2021-09-23

## 2021-09-20 MED ORDER — APIXABAN 5 MG PO TABS
5.0000 mg | ORAL_TABLET | Freq: Two times a day (BID) | ORAL | 0 refills | Status: AC
Start: 2021-09-26 — End: 2021-10-26

## 2021-09-20 MED ORDER — APIXABAN 5 MG PO TABS
10.0000 mg | ORAL_TABLET | Freq: Two times a day (BID) | ORAL | 0 refills | Status: AC
Start: 2021-09-21 — End: 2021-09-25

## 2021-09-20 NOTE — Progress Notes (Signed)
Did home O2 eval. Pt qualifies while at rest, placed on 4L NC to recover from sats of 87%.        09/20/21 1104   Exercise Saturation    $ Pulse Oximetry Type Performed Single Diagnostic   Procedure status Completed   Room air sat at rest (!) 87   Is room air sat at rest greater than 88% No   Walking room air sat   (pt qualified while at rest)   O2 flow needed to maintain sats 4   Recovery saturation 92   Recovery O2 flow 4   Patient Tolerance Tolerated well without incident   Adverse Reactions None

## 2021-09-20 NOTE — Discharge Summary (Signed)
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 female patient that was admitted on 09/17/2021  COPD on home oxygen 2 L at night, diabetes mellitus, hyperlipidemia, substance abuse, current tobacco use, depression, anxiety transferred from [facility] emergency department with altered mental status, acute hypercapnic respiratory failure, hypoglycemia and suspected drug overdose.  Patient admitted to ICU.  Patient intubated on 10/17 at [facility].  Due to COPD exacerbation along with concern of possible trazodone overdose.  CT chest obtained which showed inflammatory/infectious process.  CT chest without evidence of pulm embolism or pulm fibrosis.  Patient started on doxycycline, Solu-Medrol, inhaler treatment.  Patient's empirically with antibiotic doxycycline for 5 days.  Patient successfully extubated on 09/18/2021.  Patient continue to wean down from BiPAP to high flow to nasal cannula.  Transfer to the floor on 09/19/2021 but due to nonavailability of blood patient remained in the ICU.  Patient was complaining of left arm swelling DVT ultrasound was ordered On 09/09/2021 shows extensive deep and superficial left venous thrombosis, primarily in the left forearm.  Long segment for segment occlusive superficial venous thrombus of the mid to distal right cephalic vein.  No deep venous thrombosis  of right upper extremity.  Patient started on Eliquis and consuled over the risk of bleeding.  Patient verbalized understanding.  Oxygen is continue to wean down.  Patient is hemodynamically stable and feeling well.  Patient is willing to leave today to [place].  Oxygen arrangement has been made and patient discharge in stable condition.    Principal Problem:    AMS (altered mental status)  Active Problems:    Chronic obstructive pulmonary disease with acute exacerbation    Diabetes mellitus    Hyperlipidemia    Anxiety    Depression    Acute on chronic respiratory failure with hypoxia and hypercapnia    Leukocytosis, unspecified type    Hypoglycemia, unspecified    Obesity, morbid, BMI 40.0-49.9  Resolved Problems:    * No resolved hospital problems. *    Pending Results and other significant studies:  None     Discharge Instructions:          Disposition: Home  Diet: Cardiac Consistent Carbohydrates  Activity: As tolerated  Discharge Code Status: Full Code    No follow-up provider specified.     Discharge Medications:                                                                        Discharge Medication List        Taking  albuterol sulfate HFA 108 (90 Base) MCG/ACT inhaler  Dose: 2 puff  Commonly known as: PROVENTIL  Inhale 2 puffs into the lungs     albuterol-ipratropium 2.5-0.5(3) mg/3 mL nebulizer  Dose: 3 mL  Commonly known as: DUO-NEB  Take 3 mLs by nebulization every 4 (four) hours as needed (wheezing - shortness of breath)     * apixaban 5 MG  Dose: 10 mg  Commonly known as: ELIQUIS  Start taking on: September 21, 2021  Take 2 tablets (10 mg total) by mouth every 12 (twelve) hours for 4 days     * apixaban 5 MG  Dose: 5 mg  Commonly known as: ELIQUIS  For: Blood Clot in a Deep Vein  Start taking on: September 26, 2021  Take 1 tablet (5 mg total) by mouth every 12 (twelve) hours     aspirin EC 81 MG EC tablet  Dose: 81 mg  Take 81 mg by mouth daily     budesonide-formoterol 160-4.5 MCG/ACT  inhaler  Dose: 2 puff  Commonly known as: SYMBICORT  Inhale 2 puffs into the lungs     doxycycline 100 MG capsule  Dose: 100 mg  Commonly known as: VIBRAMYCIN  Take 1 capsule (100 mg total) by mouth 2 (two) times daily for 3 days     fluticasone 44 MCG/ACT inhaler  Dose: 1 puff  Commonly known as: FLOVENT HFA  Inhale 1 puff into the lungs 2 (two) times daily     gabapentin 800 MG tablet  Dose: 800 mg  Commonly known as: NEURONTIN  Take 800 mg by mouth 3 (three) times daily     glipiZIDE 5 MG tablet  Dose: 5 mg  Commonly known as: GLUCOTROL  Take 5 mg by mouth 2 (two) times daily before meals     lisinopril 20 MG tablet  Dose: 20 mg  Commonly known as: ZESTRIL  Take 20 mg by mouth daily     melatonin 3 mg tablet  Dose: 3 mg  Take 3 mg by mouth     metFORMIN 500 MG tablet  Dose: 1,000 mg  Commonly known as: GLUCOPHAGE  Take 1,000 mg by mouth 2 (two) times daily with meals     POLYETH GLYC-PROPYLENE GLYC NA  by Nasal route     predniSONE 20 MG tablet  Dose: 40 mg  Commonly known as: DELTASONE  Take 2 tablets (40 mg total) by mouth daily for 5 days     risperiDONE 1 MG tablet  Dose: 1 mg  Commonly known as: RisperDAL  Take 1 mg by mouth 2 (two) times daily     rosuvastatin 5 MG tablet  Dose: 5 mg  Commonly known as: CRESTOR  Take 5 mg by mouth daily     TiZANidine 4 MG capsule  Dose: 4 mg  Commonly known as: ZANAFLEX  Take 4 mg by mouth nightly           * This list has 2 medication(s) that are the same as other medications prescribed for you. Read the directions carefully, and ask your doctor or other care provider to review them with you.                STOP taking these medications      traZODone 50 MG tablet  Commonly known as: DESYREL               Discharge Day Exam (09/20/2021):     Blood pressure 98/80, pulse 83, temperature  98.3 F (36.8 C), temperature source Oral, resp. rate 18, height 1.65 m (5' 4.96"), weight 101.4 kg (223 lb 8.7 oz), SpO2 92 %.             General: Patient is awake. In no acute  distress.  Chest: CTA bilaterally. No rhonchi, no wheezing. No use of accessory muscles.  CVS: Normal rate and regular rhythm no murmurs, without JVD.  Abdomen: Soft, non-tender, no guarding or rigidity, with normal bowel sounds.  Extremities: No pitting edema, pulses palpable, no calf swelling and no gross deformity.  Skin: Warm, dry  NEURO: No motor or sensory deficits.     Recent Labs      Recent Labs   Lab 09/20/21  0421 09/19/21  0311 09/18/21  0339 09/17/21  0707 09/16/21  2330   WBC 13.2* 13.9* 15.5* 19.1* 21.0*   RBC 4.91 4.86 4.97 4.97 5.20*   Hemoglobin 14.2 14.2 14.6 15.0 15.7   Hematocrit 49.1* 48.0 48.7* 49.7* 49.0*   MCV 100 99 98 100 94   PLT CT 199 217 218 264 293     Recent Labs   Lab 09/16/21  2330   PT 10.2   PT INR 0.9     Recent Labs   Lab 09/17/21  1252 09/17/21  0917 09/17/21  0707   Troponin I 0.01 0.02 0.01     No results found for: HGBA1CPERCNT  Recent Labs   Lab 09/20/21  0421 09/19/21  1313 09/19/21  0311 09/18/21  0339 09/17/21  0707   Glucose 153* 173* 135* 171* 125*   Sodium 141 142 142 136 133*   Potassium 4.3 4.2 3.6 4.3 4.7   Chloride 104 102 105 99 92*   CO2 31* 27 30 30 30    BUN 17 19 16 16 15    Creatinine 0.64 0.70 0.64 0.82 0.66   EGFR 99 97 99 80 99   Calcium 8.6 8.6 8.2* 8.9 8.3*     Recent Labs   Lab 09/20/21  0421 09/19/21  0311 09/18/21  0339 09/17/21  0707 09/16/21  2330   Magnesium 2.4 2.3 2.1 2.2  --    Phosphorus 4.1 3.2 2.8 3.2  --    Albumin 2.9*   2.9* 2.9* 2.9* 3.3* 3.7   Protein, Total 5.7*   5.7* 5.7* 5.6* 6.3 7.0   Bilirubin, Total 0.5   0.5 0.6 0.4 0.6 0.4   Alkaline Phosphatase 53   53 61 63 76 74   ALT 67*   67* 78* 100* 149* 175*   AST (SGOT) 18   18 18 21  51* 54*        Allergies:      Patient has no known allergies.   Time spent on discharging the patient:  35 minutes   CT Head WO- (Rad read)    Result Date: 09/17/2021  1. Faint 8 mm high attenuation medially in the medial right temporal lobe is nonspecific. This could represent calcification or small focal  area of hemorrhage. Continued follow-up is recommended. 2. Otherwise no acute changes are identified. Result was called by myself at 0104 hours ReadingStation:[email or web address]    CT Angiogram Chest    Result Date: 09/18/2021  1. No pulmonary edema identified. Evaluation of the subsegmental pulmonary arteries is slightly limited by respiratory motion artifact. 2. There are a few small right-sided pulmonary nodules. These are suspected to represent an atypical infectious or inflammatory process. Follow-up chest CT in 6 months is suggested if prior imaging  is not available for comparison. 3. Indeterminate approximately 2 cm right adrenal nodule is present. Statistically, this likely represents an adenoma. If prior imaging is not available for comparison, further evaluation with elective adrenal mass protocol precontrast/postcontrast CT scan or MRI should be considered for definitive characterization. 4. Hepatic steatosis. ReadingStation:[email or web address]    XR Chest AP Portable    Result Date: 09/17/2021  1.  Endotracheal tube appears appropriately positioned. Nasogastric tube courses below the left hemidiaphragm and off the field of view. 2.  The lungs are hyperinflated with increased reticular markings, compatible with COPD. No superimposed consolidation is seen. ReadingStation:[email or web address]    XR Chest AP Portable    Result Date: 09/17/2021  ET tube and NG tube in good position. COPD and mild interstitial changes. Subsegmental right suprahilar area and left base. ReadingStation:[email or web address]    XR Chest AP Portable    Result Date: 09/17/2021  Stable mild interstitial prominence that may represent pulmonary vascular congestion. ReadingStation:[email or web address]    XR Chest AP Portable    Result Date: 09/17/2021  1. Mild central vascular prominence otherwise no acute changes. ReadingStation:[email or web address]    US Venous Up Extrem Duplex Dopp Comp Bilat    Result Date: 09/19/2021  POSITIVE study. Extensive deep and superficial left venous thrombosis,  primarily in the left forearm, as described above. Findings relayed to [facility], RN by the sonographer. Long segment 14 cm occlusive superficial venous thrombosis of the mid to distal right cephalic vein. No deep vein thrombosis of right upper extremity. ReadingStation:[email or web address]    Home Health Needs:  There are no questions and answers to display.      Sherlon Handing, MD         09/20/21 5:22 PM   MRN: [id number]                                      CSN: [id number] DOB: 1956/05/31

## 2021-09-20 NOTE — PT Eval Note (Signed)
______________________________________________________________________    I have reviewed and agree with the documentation authored by my student of the evaluation/ treatment/education/care plan delivered during my direct supervision.     Co-Signed by:  Audry Riles, PT  ______________________________________________________________________      [facility]  Patient: [name] [name]     CSN: [id number]    Bed: 3526/3526-A  Physical Therapy EVALUATION  Visit#: 1   Treatment Frequency: one time visit - therapy discontinued  Last seen by a physical therapist vs. Physical therapist assistant: 09/20/2021     DISCHARGE RECOMMENDATIONS   Discharge Recommendations:   Home with supervision       *Discharge recommendations are subject to change based on patient's progress and/or home support changes - please refer to most recent PT note for current recommendation    DME recommended for Discharge:   Front wheeled walker (Adult)    PMP (Progressive Mobility Program) Recommendations:   Recommend patient  ambulate 2-3 times/day with Wheeled walker and physical assist and/or supervision of 1 staff as tolerated.     Precautions, Contraindications, Awareness details:   Aspiration precautions  Falls  Mobility protocol     PT Assessment and Plan of Care:     HPI (per physician charting) and Pertinent Medical Details:  Admitted 09/17/2021 with/for past medical history of COPD on home oxygen 2 L at night, diabetes mellitus, hyperlipidemia, substance abuse, current tobacco use, depression and anxiety transferred from [facility] emergency department altered mental status, acute hypercapnic respiratory failure, hypoglycemia and suspected drug overdose.    Goals:    N/A    PT Assessment:  At baseline, patient is independent. No further acute PT needs at this time. Will d/c from PT services.     Patient presenting with the following PT Impairments:Appears to be at baseline for mobility, Appears to be at baseline for balance, Appears  to be at baseline with current assistive device      Treatment/interventions: No skilled acute care PT interventions needed at this time.    Due to the presence of a limited number of treatment options and no comorbidities or personal factors, as well as patient's stable and/or uncomplicated characteristics, modifications were NOT necessary to complete evaluation when examining 1-2 elements (includes body structures and functions, activity limitations and/or participation restrictions) determines the degree of complexity for this patient is LOW    Rehabilitation Potential:Not a PT candidate    Discussed risk, benefits and Plan of Care with: Patient    History Based on physician charted EPIC/EMR information:     Medical Diagnosis: Acute hypoxemic respiratory failure [J96.01]    Problem list:  Patient Active Problem List   Diagnosis    Chronic obstructive pulmonary disease with acute exacerbation    Diabetes mellitus    Hyperlipidemia    Anxiety    Depression    Acute on chronic respiratory failure with hypoxia and hypercapnia    Leukocytosis, unspecified type    AMS (altered mental status)    Acute hypoxemic respiratory failure    Hypoglycemia, unspecified    Obesity, morbid, BMI 40.0-49.9        Past Medical/Surgical History:  Past Medical History:   Diagnosis Date    Anxiety     Chronic obstructive pulmonary disease     Depression     Diabetes mellitus     Hyperlipidemia     Malignant neoplasm       No past surgical history on file.    Social History  Information per Patient:    Home Living Arrangements:  Living Arrangements: Children  Assistance Available: Full time   Type of Home: House  Home Layout: One level, with 2 stair(s) to enter, 1 rail(s), Able to live on main level with bedroom and bathroom    Prior Level of Function:  Mobility:  Independent with  No assistive device  Fall history: denies falls in the last 6 months    DME available at home:  None    Subjective   "I feel like I have sea  legs"  Patient/family/caregiver consent to therapy session is noted by the participation in the therapy session.    Pain:  At Rest: 0/10  With Activity: 0/10  Location: N/A  Interventions: None required    Examination of Body Systems:   Patients medical condition is appropriate for Physical therapy intervention at this time    Observation of patient:  Patient is in bed with telemetry, peripheral IV, O2 at 4 liters/minute via nasal cannula     Cognition:  Oriented to: Oriented x4  Command following: Follows ALL commands and directions without difficulty  Alertness/Arousal: Appropriate responses to stimuli   Attention Span:Appears intact    Vital Signs (Cardiovascular):  Stable with no signs/symptoms of distress            Balance:  Static Sitting:  Good  Static Standing:  Good  Dynamic Standing:  Good            Musculoskeletal Examination:            Range of motion:  Right LE: Grossly WFL  Left LE: Grossly WFL       Strength:  Right LE: Grossly WFL  Left LE: Grossly WFL    Tone:  Not applicable    Functional Mobility:    Bed Mobility:  Not tested due to patient already OOB.    Transfers:  Sit to Stand:  Independent with No assistive device.         Stand to Sit:  Independent.           Locomotion:  LEVEL AMBULATION:  Distance: 34ft   Assistance level:  Minimal assist  Device:  Hand held assist  Pattern:  Reciprocal, Narrow base of support, Decreased cadence, Decreased step length:  bilaterally        Treatment Interventions this session:   Evaluation  Therapeutic activity  Gait training    Education Provided:   TOPICS: role of physical therapy, plan of care, goals of therapy and HEP, safety with mobility and ADLs, benefits of activity, discharge instructions, home safety     Learner educated: Patient  Method: Explanation  Response to education: Verbalized understanding    Patient Position at End of Treatment:   Sitting, in a chair, Family/visitors present, Needs in reach, Bed/chair alarm set, and No  distress    Team Communication:     Spoke to: RN/LPN - Alishia  Regarding: Pre-session re: patient status, Patient position at end of session  Whiteboard updated: No  PT/PTA communication: via written note and verbal communication as needed.    Time of treatment:  Time Calculation  PT Received On: 09/20/21  Start Time: 1117  Stop Time: 1129  Time Calculation (min): 12 min    Lenise Herald, SPT

## 2021-09-20 NOTE — Plan of Care (Signed)
Problem: Compromised Tissue integrity  Goal: Damaged tissue is healing and protected  09/20/2021 1526 by Ellicia Alix, Leonides Schanz, RN  Outcome: Completed  09/20/2021 0837 by Finas Delone, Leonides Schanz, RN  Outcome: Progressing  09/20/2021 0836 by Indyah Saulnier, Leonides Schanz, RN  Outcome: Progressing  Goal: Nutritional status is improving  09/20/2021 1526 by Shreyan Hinz, Leonides Schanz, RN  Outcome: Completed  09/20/2021 0837 by Marvelous Bouwens, Leonides Schanz, RN  Outcome: Progressing  09/20/2021 0836 by Juliett Eastburn, Leonides Schanz, RN  Outcome: Progressing     Problem: Moderate/High Fall Risk Score >5  Goal: Patient will remain free of falls  09/20/2021 1526 by Bret Stamour, Leonides Schanz, RN  Outcome: Completed  09/20/2021 0837 by Ramya Vanbergen, Leonides Schanz, RN  Outcome: Progressing  09/20/2021 0836 by Nolyn Swab, Leonides Schanz, RN  Outcome: Progressing     Problem: Non-Violent Restraints Interdisciplinary Plan  Goal: Will be injury free during the use of non-violent restraints  09/20/2021 0837 by Vlasta Baskin, Leonides Schanz, RN  Outcome: Completed  09/20/2021 0836 by Jacki Couse, Leonides Schanz, RN  Outcome: Progressing     Problem: Violent or Self-destructive Restraints Interdisciplinary Plan  Goal: Will be injury free during the use of restraints  09/20/2021 0837 by Randilyn Foisy, Leonides Schanz, RN  Outcome: Completed  09/20/2021 0836 by Zi Newbury, Leonides Schanz, RN  Outcome: Progressing     Problem: Inadequate Gas Exchange  Goal: Adequate oxygenation and improved ventilation  09/20/2021 1526 by Markelle Asaro, Leonides Schanz, RN  Outcome: Completed  09/20/2021 0837 by Hermena Swint, Leonides Schanz, RN  Outcome: Progressing  09/20/2021 0836 by Raynaldo Falco, Leonides Schanz, RN  Outcome: Progressing  Goal: Patent Airway maintained  09/20/2021 1526 by Mirko Tailor, Leonides Schanz, RN  Outcome: Completed  09/20/2021 0837 by Rohil Lesch, Leonides Schanz, RN  Outcome: Progressing  09/20/2021 0836 by Arnita Koons, Leonides Schanz, RN  Outcome: Progressing     Problem: Inadequate Airway Clearance  Goal: Normal respiratory rate/effort achieved/maintained  09/20/2021 1526 by Juwana Thoreson, Leonides Schanz, RN  Outcome: Completed  09/20/2021 0837 by Airel Magadan, Leonides Schanz, RN  Outcome: Progressing  09/20/2021 0836 by Benancio Osmundson, Leonides Schanz, RN  Outcome: Progressing     Problem: Artificial Airway  Goal: Endotracheal tube will be maintained  09/20/2021 0837 by Lorraine Terriquez, Leonides Schanz, RN  Outcome: Completed  09/20/2021 0836 by Jaidin Ugarte, Leonides Schanz, RN  Outcome: Progressing  Goal: Tracheostomy will be maintained  09/20/2021 0837 by Jone Panebianco, Leonides Schanz, RN  Outcome: Completed  09/20/2021 0836 by Caeden Foots, Leonides Schanz, RN  Outcome: Progressing

## 2021-09-20 NOTE — Progress Notes (Signed)
NURSE NOTE SUMMARY  [facility] - CRITICAL CARE 3   Patient Name: [name] [name],[name] [name]   Attending Physician: Sherlon Handing, MD   Today's date:   09/20/2021 LOS: 3 days   Shift Summary:                                                              1600: Discharge instructions and information reviewed with patient. She denies any questions or concerns.  IV removed without complications.  Patient transported to [employer] with this RN for their ride. Signature sheet signed and placed on chart.        Provider Notifications:        Rapid Response Notifications:  Mobility:      PMP Activity: Step 5 - Chair (09/20/2021  8:29 AM)     Weight tracking:  Family Dynamic:   Last 3 Weights for the past 72 hrs (Last 3 readings):   Weight   09/19/21 0409 101.4 kg (223 lb 8.7 oz)             Last Bowel Movement   Last BM Date:  (PTA)

## 2021-09-20 NOTE — Progress Notes (Signed)
Quick Doc  [facility] - CRITICAL CARE 3   Patient Name: [facility]   Attending Physician: Sherlon Handing, MD   Today's date:   09/20/2021 LOS: 3 days   Expected Discharge Date      Quick  Assessment:                                                              ReAdmit Risk Score: 16.96    CM Comments: 09/20/2021 RNCM (AP) Patient is being discharged today pending on portable O2 arrangement. Patient is already a patient of [facility] oxygen services. CM has arranged with local [facility] to provide 4 e-tanks for ride home to [place], [place]. Pt's daughter Pattricia Boss [phone], picked up the e-tanks from the [place] office. Per daughter, they also have a nebulizer machine and medicine with car adaptor if needed when pt. starts to cough and wheeze. Pt. wanted to be able to go home today. Daughter will be driving. No other CM interventions needed.    Physical Discharge Disposition: Home                                   Mode of Transportation: Car                                      Physical Discharge Disposition: Home       Provider Notifications:       Calla Kicks, RN, BSN  Case Manager  [phone]  [phone]: fax

## 2021-09-20 NOTE — Plan of Care (Signed)
Problem: Compromised Tissue integrity  Goal: Damaged tissue is healing and protected  09/20/2021 0837 by Darinda [place], Leonides Schanz, RN  Outcome: Progressing  09/20/2021 0836 by Anice Wilshire, Leonides Schanz, RN  Outcome: Progressing  Goal: Nutritional status is improving  09/20/2021 0837 by Muaz Shorey, Leonides Schanz, RN  Outcome: Progressing  09/20/2021 0836 by Kinnie Kaupp, Leonides Schanz, RN  Outcome: Progressing     Problem: Moderate/High Fall Risk Score >5  Goal: Patient will remain free of falls  09/20/2021 0837 by Shiori Adcox, Leonides Schanz, RN  Outcome: Progressing  09/20/2021 0836 by Demitrious Mccannon, Leonides Schanz, RN  Outcome: Progressing     Problem: Inadequate Gas Exchange  Goal: Adequate oxygenation and improved ventilation  09/20/2021 0837 by Kaaliyah Kita, Leonides Schanz, RN  Outcome: Progressing  09/20/2021 0836 by Frankye Schwegel, Leonides Schanz, RN  Outcome: Progressing  Goal: Patent Airway maintained  09/20/2021 0837 by Saranne Crislip, Leonides Schanz, RN  Outcome: Progressing  09/20/2021 0836 by Gerod Caligiuri, Leonides Schanz, RN  Outcome: Progressing     Problem: Inadequate Airway Clearance  Goal: Normal respiratory rate/effort achieved/maintained  09/20/2021 0837 by Brittnei Jagiello, Leonides Schanz, RN  Outcome: Progressing  09/20/2021 0836 by Rosamae Rocque, Leonides Schanz, RN  Outcome: Progressing

## 2021-09-22 LAB — VH CULTURE-BLOOD-VENIPUNCTURE: Culture Result: NO GROWTH

## 2021-09-26 ENCOUNTER — Other Ambulatory Visit: Payer: Self-pay | Admitting: *Deleted

## 2021-09-26 NOTE — Patient Outreach (Signed)
[place] [facility]) Care Management  09/26/2021  Jasmine Werner 1956/07/08 [id number]   Outgoing call placed to member to follow up on recent hospitalization, no answer, HIPAA compliant voice message left.  Will follow up within the next 3-5 business days.  Valente David, [place], MSN [place] [phone]

## 2021-09-26 NOTE — Patient Outreach (Signed)
[facility]) Care Management  [place]  09/26/2021   Jasmine Werner [birth year] [id number]    Incoming call received back from member and daughter.  Member state she is feeling better, still has cough, no current shortness of breath.  Denies any urgent concerns, encouraged to contact this care manager with questions.    Encounter Medications:  Outpatient Encounter Medications as of 09/26/2021  Medication Sig Note   ASPIRIN 81 PO Take 81 mg by mouth at bedtime. (Patient not taking: Reported on 04/11/2021)    budesonide-formoterol (SYMBICORT) 160-4.5 MCG/ACT inhaler Inhale 2 puffs into the lungs 2 (two) times daily.    divalproex (DEPAKOTE) 250 MG DR tablet Take 1 tablet (250 mg total) by mouth 2 (two) times daily. For mood stabilization (Patient not taking: No sig reported)    escitalopram (LEXAPRO) 10 MG tablet Start 5 mg daily for one week, then 10 mg daily (Patient not taking: No sig reported)    fluticasone (FLOVENT HFA) 44 MCG/ACT inhaler Inhale 2 puffs into the lungs 2 (two) times daily. For shortness of breath (Patient not taking: Reported on 04/11/2021)    fluvoxaMINE (LUVOX) 100 MG tablet Take 100 mg by mouth at bedtime.    gabapentin (NEURONTIN) 600 MG tablet Take 1 tablet (600 mg total) by mouth at bedtime. For agitation 04/11/2021: Taking 800 mg at bedtime   ipratropium-albuterol (DUONEB) 0.5-2.5 (3) MG/3ML SOLN Inhale 3 mLs into the lungs every 6 (six) hours as needed (Shortness of Breath).    lisinopril (ZESTRIL) 10 MG tablet Take 1 tablet (10 mg total) by mouth daily.    melatonin 10 MG TABS Take 10 mg by mouth at bedtime. For sleep    metFORMIN (GLUCOPHAGE) 500 MG tablet Take 1,000 mg by mouth 2 (two) times daily with a meal.    polyethylene glycol-electrolytes (TRILYTE) 420 g solution Take 4,000 mLs by mouth as directed. (Patient not taking: No sig reported)    risperiDONE (RISPERDAL) 1 MG tablet Take 1 mg by mouth at bedtime.    rosuvastatin  (CRESTOR) 5 MG tablet Take 5 mg by mouth daily.    tiZANidine (ZANAFLEX) 4 MG tablet Take 4 mg by mouth at bedtime as needed for muscle spasms.    VENTOLIN HFA 108 (90 BASE) MCG/ACT inhaler Inhale 1 puff into the lungs every 6 (six) hours as needed for shortness of breath.     No facility-administered encounter medications on file as of 09/26/2021.    Functional Status:  No flowsheet data found.  Fall/Depression Screening: Fall Risk  04/11/2021  Falls in the past year? 0  Number falls in past yr: 0  Injury with Fall? 0   PHQ 2/9 Scores 04/11/2021  PHQ - 2 Score 6  PHQ- 9 Score 27  Some encounter information is confidential and restricted. Go to Review Flowsheets activity to see all data.    Assessment:   Care Plan Care Plan : [facility] Plan of Care (Adult)  Updates made by Valente David, RN since 09/26/2021 12:00 AM     Problem: Difficulty managing chronic medical conditions (DM and COPD)   Priority: High     Long-Range Goal: Member will show management of chronic medical conditions (COPD and DM) evidenced by no hospital readmissions   Start Date: 09/26/2021  Expected End Date: 03/25/2022  Priority: High  Note:   Current Barriers:  Knowledge Deficits related to plan of care for management of COPD and DMII Chronic Disease Management support and education needs related to COPD  and DMII  RNCM Clinical Goal(s):  Patient will verbalize understanding of plan for management of COPD and DMII verbalize basic understanding of  COPD and DMII disease process and self health management plan   attend all scheduled medical appointments: PCP and pulmonologist not experience hospital admission. Hospital Admissions in last 6 months = 1 demonstrate a decrease in COPD and DMII exacerbations of COPD experience decrease in ED visits. ED visits in last 6 months = 2  through collaboration with RN Care manager, provider, and care team.   10/26 - Member currently living with daughter since most recent  hospital discharge.  Daughter will be moving to [place] in the next few weeks, considering having member move with her.  She is looking for pulmonologist in the [place] area that will be able to take member, will call PCP office to have referral sent directly to provider in [place].  Was seen by PCP today, started on new inhaler for COPD.  Also advised daughter to contact DSS to inquire about process to change Medicaid from [place] to [place].    Interventions:  Inter-disciplinary care team collaboration (see longitudinal plan of care) Evaluation of current treatment plan related to  self management and patient's adherence to plan as established by provider   Diabetes Interventions: Assessed patient's understanding of A1c goal: <7% Provided education to patient about basic DM disease process; Reviewed medications with patient and discussed importance of medication adherence; Advised patient, providing education and rationale, to check cbg daily and record, calling PCP for findings outside established parameters; Lab Results  Component Value Date   HGBA1C 6.2 (H) 06/30/2019   COPD Interventions:   Advised patient to track and manage COPD triggers;  Provided instruction about proper use of medications used for management of COPD including inhalers; Discussed the importance of adequate rest and management of fatigue with COPD;  10/26 - Reminded of importance of using O2 consistently and taking it with her on trips.  Daughter working to help get member condensed oxygen that is battery operated.  Patient Goals/Self-Care Activities: Patient will self administer medications as prescribed Patient will attend all scheduled provider appointments Patient will continue to perform ADL's independently  Follow Up Plan:  Telephone follow up appointment with care management team member scheduled for:  11/15       Goals Addressed             This Visit's Progress    COMPLETED: [facility] - Monitor and Manage My Blood  Sugar-Diabetes Type 2   On track    Timeframe:  Short-Term Goal Priority:  High Start Date:           9/22                 Expected End Date:       11/22 (date reset)  Barriers: Knowledge Psychosocial    - enter blood sugar readings and medication or insulin into daily log - take the blood sugar log to all doctor visits    Why is this important?   Checking your blood sugar at home helps to keep it from getting very high or very low.  Writing the results in a diary or log helps the doctor know how to care for you.  Your blood sugar log should have the time, date and the results.  Also, write down the amount of insulin or other medicine that you take.  Other information, like what you ate, exercise done and how you were feeling, will also  be helpful.     Notes:   5/11 - Encouraged to ask PCP for prescription for glucose meter  6/27 - Has started monitoring blood sugars, range 70's-mid 100s.  Highest of 200 once after meal.  7/20 - Encouraged to continue daily monitoring as well as diet management in effort to keep blood sugars controlled  8/25 - Report taking Metformin as instructed.  Blood sugars range 80-110.  Continues to work to improve diet  9/22 - Not consistent with monitoring blood sugars, not documenting them, unable to report numbers.  She has been taking Metformin, was also placed on new medication on 9/20 after visit with PCP, not picked up from pharmacy yet.  Advised of importance of taking medications as prescribed in effort to manage condition  10/26 - Resolving due to duplicate goal     COMPLETED: [facility] - Set My Target A1C-Diabetes Type 2   On track    Timeframe:  Long-Range Goal Priority:  Medium Start Date:            9/22            Expected End Date:   12/22 (goal reset)                Barriers: Knowledge     - set target A1C - 7   Why is this important?   Your target A1C is decided together by you and your doctor.  It is based on several things like  your age and other health issues.    Notes:   5/11 - EMMI diabetes education assigned, book mailed  6/27 - Confirms she has received education and reading material.    7/20 - Agrees to contact this care manager with questions regarding DM management  8/25 - Has follow up with PCP on 9/20, will have A1C drawn at that time  9/22 - A1C decreased from 8.5 to 8.0, still struggling to manage diabetes, particularly diet (had cereal and pork skins today).  State there are times where she is too tired to cook, eating out a lot.  Will look into [employer], that serve cooked meals Monday-Thursday for lunch.  She will notify this RNCM if she would like to pursue other options.  Looking forward to going to see her brother and family in [place] next week, will be gone for 3 week, will likely have better control of her diet while with family.  10/26 - Resolving due to duplicate goal        Plan:  Follow-up: Patient agrees to Care Plan and Follow-up. Follow-up in 1 month(s).  Valente David, [place], MSN [place] [phone]

## 2021-09-27 ENCOUNTER — Ambulatory Visit: Payer: Self-pay | Admitting: *Deleted

## 2021-09-27 NOTE — Patient Outreach (Signed)
[facility]) Care Management  09/27/2021  Taelyr Jantz 1955-12-21 [id number]   Pt released from hospital and is staying with her daughter right now- her Oxygen has been increased to 24 hrs daily in stead of just at night.   Pt in good spirits and hoping to continue with her recovery. Support offered and reminded them to call if needs arise prior to next call -   Eduard Clos, MSW, [place] Worker  [place] [phone]

## 2021-10-02 ENCOUNTER — Ambulatory Visit: Payer: Self-pay | Admitting: *Deleted

## 2021-10-09 LAB — ECG 12-LEAD
P Wave Axis: 64 deg
P-R Interval: 152 ms
Patient Age: 65 years
Q-T Interval(Corrected): 494 ms
Q-T Interval: 457 ms
QRS Axis: 58 deg
QRS Duration: 82 ms
T Axis: 72 years
Ventricular Rate: 70 //min

## 2021-10-16 ENCOUNTER — Other Ambulatory Visit: Payer: Self-pay | Admitting: *Deleted

## 2021-10-16 NOTE — Patient Outreach (Signed)
[facility]) Care Management  10/16/2021  Jasmine Werner March 05, 1956 [id number]   Outgoing call placed to member, no answer, HIPAA compliant voice message left.  Will follow up within the next 3-4 business days.  Valente David, [place], MSN [place] [phone]

## 2021-10-19 ENCOUNTER — Other Ambulatory Visit: Payer: Self-pay | Admitting: *Deleted

## 2021-10-19 NOTE — Patient Outreach (Signed)
[place] [facility]) Care Management  10/19/2021  Shonteria Abeln July 01, 1956 [id number]   Outreach attempt #2, unsuccessful, HIPAA compliant voice message left.  Will send outreach letter and follow up within the next 3-4 business days.    Valente David, [place], MSN [place] [phone]

## 2021-10-23 ENCOUNTER — Ambulatory Visit: Payer: Self-pay | Admitting: *Deleted

## 2021-10-24 ENCOUNTER — Telehealth: Payer: Self-pay | Admitting: *Deleted

## 2021-10-24 ENCOUNTER — Other Ambulatory Visit: Payer: Self-pay | Admitting: *Deleted

## 2021-10-24 NOTE — Patient Outreach (Signed)
[facility]) Care Management  10/24/2021  Jasmine Werner 1956/01/05 [id number]   Outreach attempt #3, successful.  State she is in the process of relocating to another state with her daughter over the next couple weeks.  Daughter is helping to secure a primary provider through insurance company.  She state she continues to recover well since her recent hospitalization.  Denies any urgent concerns, encouraged to contact this care manager with questions.  Will close case at this time as member is relocating.  Should member not relocate, primary provider office will follow for chronic care management.  Valente David, [place], MSN [place] [phone]

## 2021-10-24 NOTE — Patient Outreach (Signed)
[place] [facility]) Care Management  10/24/2021  Binta Statzer [birth year] [id number]   CSW made a followup attempt to try and contact patient today and was unable-  A HIPAA compliant message was left for patient on voicemail. CSW will try again in the next 30 days if no return call is received.    Eduard Clos, MSW, [place] Worker  [place] [phone]

## 2021-11-13 ENCOUNTER — Ambulatory Visit: Payer: Self-pay | Admitting: *Deleted

## 2021-11-14 ENCOUNTER — Telehealth: Payer: Self-pay | Admitting: *Deleted

## 2021-11-14 NOTE — Patient Outreach (Signed)
[place]) Care Management  11/14/2021  Anaiah Mcmannis [birth year] [id number]   CSW  attempted a follow up call to pt and without success.  A HIPAA compliant message was left for patient on voicemail. CSW will try again in the next 10days if no return call is received.   Eduard Clos, MSW, [place] Worker  [place] [phone]

## 2021-11-22 ENCOUNTER — Ambulatory Visit: Payer: Self-pay | Admitting: *Deleted

## 2021-12-21 ENCOUNTER — Telehealth: Payer: Self-pay | Admitting: *Deleted

## 2021-12-21 NOTE — Patient Outreach (Signed)
[facility]) Care Management  12/21/2021  [name] 31-Aug-1956 [id number]   Care Management   Follow Up Note   12/21/2021 Name: Jasmine Werner MRN: [id number] DOB: 04-30-1956   Referred by: Caryl Bis, MD Reason for referral : No chief complaint on file.   Third unsuccessful telephone outreach was attempted today. The patient was referred to the case management team for assistance with care management and care coordination. The patient's primary care provider has been notified of our unsuccessful attempts to make or maintain contact with the patient. The care management team is pleased to engage with this patient at any time in the future should he/she be interested in assistance from the care management team.   Follow Up Plan:  No further follow up planned- please re-consult if needs arise and/or pt If patient returns call to provider office, please advise to call  PCP and request CSW be re-consulted.   Eduard Clos, MSW, [place] Worker  [place]

## 2024-01-03 DEATH — deceased
